# Patient Record
Sex: Male | Born: 1940
Health system: Southern US, Community
[De-identification: ages and names within clinical notes are randomized; demographics above are authoritative.]

## PROBLEM LIST (undated history)

## (undated) ENCOUNTER — Ambulatory Visit: Admission: EM | Source: Home / Self Care

## (undated) ENCOUNTER — Ambulatory Visit: Admission: EM | Payer: Medicare Other

## (undated) DIAGNOSIS — R05 Cough: Secondary | ICD-10-CM

## (undated) DIAGNOSIS — K219 Gastro-esophageal reflux disease without esophagitis: Secondary | ICD-10-CM

## (undated) DIAGNOSIS — R059 Cough, unspecified: Secondary | ICD-10-CM

## (undated) DIAGNOSIS — M67919 Unspecified disorder of synovium and tendon, unspecified shoulder: Secondary | ICD-10-CM

## (undated) DIAGNOSIS — R7989 Other specified abnormal findings of blood chemistry: Secondary | ICD-10-CM

## (undated) DIAGNOSIS — R7303 Prediabetes: Secondary | ICD-10-CM

## (undated) DIAGNOSIS — J382 Nodules of vocal cords: Secondary | ICD-10-CM

## (undated) DIAGNOSIS — I619 Nontraumatic intracerebral hemorrhage, unspecified: Secondary | ICD-10-CM

## (undated) DIAGNOSIS — E291 Testicular hypofunction: Secondary | ICD-10-CM

## (undated) DIAGNOSIS — I10 Essential (primary) hypertension: Secondary | ICD-10-CM

## (undated) DIAGNOSIS — G4700 Insomnia, unspecified: Secondary | ICD-10-CM

## (undated) DIAGNOSIS — G4730 Sleep apnea, unspecified: Secondary | ICD-10-CM

## (undated) DIAGNOSIS — I639 Cerebral infarction, unspecified: Secondary | ICD-10-CM

## (undated) DIAGNOSIS — R490 Dysphonia: Secondary | ICD-10-CM

## (undated) DIAGNOSIS — M810 Age-related osteoporosis without current pathological fracture: Secondary | ICD-10-CM

## (undated) DIAGNOSIS — S20219A Contusion of unspecified front wall of thorax, initial encounter: Secondary | ICD-10-CM

## (undated) DIAGNOSIS — E162 Hypoglycemia, unspecified: Secondary | ICD-10-CM

## (undated) DIAGNOSIS — K2970 Gastritis, unspecified, without bleeding: Secondary | ICD-10-CM

## (undated) DIAGNOSIS — T4145XA Adverse effect of unspecified anesthetic, initial encounter: Secondary | ICD-10-CM

## (undated) DIAGNOSIS — K225 Diverticulum of esophagus, acquired: Secondary | ICD-10-CM

## (undated) DIAGNOSIS — T8859XA Other complications of anesthesia, initial encounter: Secondary | ICD-10-CM

## (undated) DIAGNOSIS — E785 Hyperlipidemia, unspecified: Secondary | ICD-10-CM

## (undated) DIAGNOSIS — Z8619 Personal history of other infectious and parasitic diseases: Secondary | ICD-10-CM

## (undated) DIAGNOSIS — M1990 Unspecified osteoarthritis, unspecified site: Secondary | ICD-10-CM

## (undated) DIAGNOSIS — J383 Other diseases of vocal cords: Secondary | ICD-10-CM

## (undated) HISTORY — DX: Other specified abnormal findings of blood chemistry: R79.89

## (undated) HISTORY — DX: Prediabetes: R73.03

## (undated) HISTORY — DX: Contusion of unspecified front wall of thorax, initial encounter: S20.219A

## (undated) HISTORY — PX: FRACTURE SURGERY: SHX138

## (undated) HISTORY — PX: THROAT SURGERY: SHX803

## (undated) HISTORY — PX: TONSILLECTOMY: SUR1361

## (undated) HISTORY — DX: Insomnia, unspecified: G47.00

## (undated) HISTORY — DX: Gastro-esophageal reflux disease without esophagitis: K21.9

## (undated) HISTORY — DX: Essential (primary) hypertension: I10

## (undated) HISTORY — PX: EYE SURGERY: SHX253

## (undated) HISTORY — DX: Hyperlipidemia, unspecified: E78.5

## (undated) HISTORY — DX: Dysphonia: R49.0

## (undated) HISTORY — DX: Testicular hypofunction: E29.1

## (undated) HISTORY — DX: Unspecified disorder of synovium and tendon, unspecified shoulder: M67.919

## (undated) HISTORY — DX: Other diseases of vocal cords: J38.3

## (undated) HISTORY — PX: FEMUR FRACTURE SURGERY: SHX633

## (undated) HISTORY — PX: OTHER SURGICAL HISTORY: SHX169

## (undated) HISTORY — DX: Diverticulum of esophagus, acquired: K22.5

## (undated) HISTORY — DX: Hypoglycemia, unspecified: E16.2

## (undated) HISTORY — DX: Gastritis, unspecified, without bleeding: K29.70

## (undated) HISTORY — PX: PENILE BIOPSY: SHX6013

## (undated) HISTORY — DX: Personal history of other infectious and parasitic diseases: Z86.19

## (undated) HISTORY — DX: Age-related osteoporosis without current pathological fracture: M81.0

## (undated) HISTORY — DX: Cerebral infarction, unspecified: I63.9

## (undated) HISTORY — DX: Unspecified osteoarthritis, unspecified site: M19.90

## (undated) HISTORY — PX: APPENDECTOMY: SHX54

## (undated) HISTORY — PX: SHOULDER SURGERY: SHX246

## (undated) HISTORY — DX: Nodules of vocal cords: J38.2

---

## 2013-05-07 ENCOUNTER — Ambulatory Visit: Payer: Self-pay | Admitting: Orthopedic Surgery

## 2013-09-22 ENCOUNTER — Emergency Department: Payer: Self-pay | Admitting: Emergency Medicine

## 2013-09-22 LAB — CBC
HCT: 42.5 % (ref 40.0–52.0)
Platelet: 193 10*3/uL (ref 150–440)
RBC: 4.53 10*6/uL (ref 4.40–5.90)
RDW: 13.4 % (ref 11.5–14.5)
WBC: 7.6 10*3/uL (ref 3.8–10.6)

## 2013-09-22 LAB — TROPONIN I: Troponin-I: <0.02 ng/mL

## 2013-09-22 LAB — COMPREHENSIVE METABOLIC PANEL
Albumin: 3.7 g/dL (ref 3.4–5.0)
Alkaline Phosphatase: 58 U/L (ref 50–136)
BUN: 22 mg/dL — ABNORMAL HIGH (ref 7–18)
Chloride: 104 mmol/L (ref 98–107)
Co2: 28 mmol/L (ref 21–32)
Creatinine: 0.9 mg/dL (ref 0.60–1.30)
EGFR (Non-African Amer.): >60
Glucose: 107 mg/dL — ABNORMAL HIGH (ref 65–99)
Osmolality: 276 (ref 275–301)
Potassium: 3.8 mmol/L (ref 3.5–5.1)
SGPT (ALT): 26 U/L (ref 12–78)
Total Protein: 7.4 g/dL (ref 6.4–8.2)

## 2013-09-22 LAB — URINALYSIS, COMPLETE
Bacteria: NONE SEEN
Bilirubin,UR: NEGATIVE
Glucose,UR: NEGATIVE mg/dL (ref 0–75)
Hyaline Cast: 5
Ketone: NEGATIVE
Leukocyte Esterase: NEGATIVE
Nitrite: NEGATIVE
Ph: 5 (ref 4.5–8.0)
RBC,UR: 10 /HPF (ref 0–5)
Specific Gravity: 1.02 (ref 1.003–1.030)
Squamous Epithelial: <1
WBC UR: <1 /HPF (ref 0–5)

## 2013-09-22 LAB — LIPASE, BLOOD: Lipase: 175 U/L (ref 73–393)

## 2017-01-01 DIAGNOSIS — N481 Balanitis: Secondary | ICD-10-CM | POA: Diagnosis not present

## 2017-01-01 DIAGNOSIS — N401 Enlarged prostate with lower urinary tract symptoms: Secondary | ICD-10-CM | POA: Diagnosis not present

## 2017-01-01 DIAGNOSIS — R3912 Poor urinary stream: Secondary | ICD-10-CM | POA: Diagnosis not present

## 2017-01-01 DIAGNOSIS — N476 Balanoposthitis: Secondary | ICD-10-CM | POA: Diagnosis not present

## 2017-01-09 ENCOUNTER — Ambulatory Visit
Admission: AD | Admit: 2017-01-09 | Discharge: 2017-01-09 | Disposition: A | Discharge status: Home / Self Care | Payer: Medicare Other | Attending: Family Medicine | Admitting: Family Medicine

## 2017-01-09 DIAGNOSIS — Z79899 Other long term (current) drug therapy: Secondary | ICD-10-CM | POA: Insufficient documentation

## 2017-01-09 DIAGNOSIS — Z9889 Other specified postprocedural states: Secondary | ICD-10-CM | POA: Insufficient documentation

## 2017-01-09 DIAGNOSIS — I10 Essential (primary) hypertension: Secondary | ICD-10-CM | POA: Diagnosis not present

## 2017-01-09 DIAGNOSIS — I629 Nontraumatic intracranial hemorrhage, unspecified: Secondary | ICD-10-CM | POA: Insufficient documentation

## 2017-01-09 DIAGNOSIS — M940 Chondrocostal junction syndrome [Tietze]: Secondary | ICD-10-CM

## 2017-01-09 DIAGNOSIS — R079 Chest pain, unspecified: Secondary | ICD-10-CM | POA: Diagnosis not present

## 2017-01-09 HISTORY — DX: Nontraumatic intracerebral hemorrhage, unspecified: I61.9

## 2017-01-09 NOTE — ED Provider Notes (Signed)
[facility]    CSN: [id number] Arrival date & time: 01/09/17  1700     History   Chief Complaint Chief Complaint  Patient presents with  . Chest Pain    HPI Shawn Khan is a 76 y.o. male.   The history is provided by the patient.  Chest Pain  Pain location:  L chest Pain quality: sharp and stabbing   Pain radiates to:  Does not radiate Pain severity:  Mild Onset quality:  Gradual Duration:  3 days Timing:  Constant Progression:  Unchanged Chronicity:  New Context: lifting (luggage recently) and movement (worse with movement)   Worsened by:  Certain positions, coughing, movement and deep breathing Ineffective treatments:  None tried Associated symptoms: no abdominal pain, no AICD problem, no altered mental status, no anorexia, no anxiety, no back pain, no claudication, no cough, no diaphoresis, no dizziness, no dysphagia, no fatigue, no fever, no headache, no heartburn, no lower extremity edema, no nausea, no near-syncope, no numbness, no orthopnea, no palpitations, no PND, no shortness of breath, no syncope, no vomiting and no weakness   Associated symptoms comment:  Had recent cough, but now resolved; states about 1-2 weeks ago had a dry severe cough Risk factors: hypertension   Risk factors: no coronary artery disease     Past Medical History:  Diagnosis Date  . Hemorrhagic stroke ([place])     There are no active problems to display for this patient.   Past Surgical History:  Procedure Laterality Date  . APPENDECTOMY    . FEMUR FRACTURE SURGERY    . TONSILLECTOMY         Home Medications    Prior to Admission medications   Medication Sig Start Date End Date Taking? Authorizing Provider  lisinopril (PRINIVIL,ZESTRIL) 10 MG tablet Take 10 mg by mouth daily.   Yes Historical Provider, MD  tamsulosin (FLOMAX) 0.4 MG CAPS capsule Take 0.4 mg by mouth.   Yes Historical Provider, MD    Family History History reviewed. No pertinent family  history.  Social History Social History  Substance Use Topics  . Smoking status: Never Smoker  . Smokeless tobacco: Never Used  . Alcohol use No     Allergies   Patient has no known allergies.   Review of Systems Review of Systems  Constitutional: Negative for diaphoresis, fatigue and fever.  HENT: Negative for trouble swallowing.   Respiratory: Negative for cough and shortness of breath.   Cardiovascular: Positive for chest pain. Negative for palpitations, orthopnea, claudication, syncope, PND and near-syncope.  Gastrointestinal: Negative for abdominal pain, anorexia, heartburn, nausea and vomiting.  Musculoskeletal: Negative for back pain.  Neurological: Negative for dizziness, weakness, numbness and headaches.     Physical Exam Triage Vital Signs ED Triage Vitals  Enc Vitals Group     BP 01/09/17 1731 130/75     Pulse Rate 01/09/17 1731 62     Resp 01/09/17 1731 17     Temp --      Temp src --      SpO2 01/09/17 1731 97 %     Weight 01/09/17 1731 175 lb (79.4 kg)     Height 01/09/17 1731 5\' 9"  (1.753 m)     Head Circumference --      Peak Flow --      Pain Score 01/09/17 1733 6     Pain Loc --      Pain Edu? --      Excl. in [place]? --  No data found.   Updated Vital Signs BP 130/75 (BP Location: Left Arm)   Pulse 62   Resp 17   Ht 5\' 9"  (1.753 m)   Wt 175 lb (79.4 kg)   SpO2 97%   BMI 25.84 kg/m   Visual Acuity Right Eye Distance:   Left Eye Distance:   Bilateral Distance:    Right Eye Near:   Left Eye Near:    Bilateral Near:     Physical Exam  Constitutional: He is oriented to person, place, and time. He appears well-developed and well-nourished. No distress.  Cardiovascular: Regular rhythm, normal heart sounds and intact distal pulses.  Bradycardia present.   No murmur heard. Pulmonary/Chest: Effort normal and breath sounds normal. No respiratory distress. He has no wheezes. He has no rales. He exhibits tenderness (point tenderness at left  upper chest wall;  reproducible of symptom).  Abdominal: Soft.  Musculoskeletal: He exhibits no edema.  Neurological: He is alert and oriented to person, place, and time.  Skin: He is not diaphoretic.  Nursing note and vitals reviewed.    UC Treatments / Results  Labs (all labs ordered are listed, but only abnormal results are displayed) Labs Reviewed - No data to display  EKG  EKG Interpretation None       Radiology No results found.  Procedures ED EKG Date/Time: 01/09/2017 5:58 PM Performed by: Norval Gable Authorized by: Norval Gable   ECG reviewed by ED Physician in the absence of a cardiologist: yes   Previous ECG:    Previous ECG:  Unavailable Interpretation:    Interpretation: abnormal   Rate:    ECG rate assessment: bradycardic   Rhythm:    Rhythm: sinus bradycardia   Ectopy:    Ectopy: none   QRS:    QRS axis:  Normal Conduction:    Conduction: normal   ST segments:    ST segments:  Normal T waves:    T waves: normal      (including critical care time)  Medications Ordered in UC Medications - No data to display   Initial Impression / Assessment and Plan / UC Course  I have reviewed the triage vital signs and the nursing notes.  Pertinent labs & imaging results that were available during my care of the patient were reviewed by me and considered in my medical decision making (see chart for details).       Final Clinical Impressions(s) / UC Diagnoses   Final diagnoses:  Costochondritis, acute    New Prescriptions Discharge Medication List as of 01/09/2017  5:55 PM     1. ekg results and diagnosis reviewed with patient 2. Recommend supportive treatment with otc acetaminophen, rest, ice 3. Follow-up prn if symptoms worsen or don't improve   Norval Gable, MD 01/09/17 [phone]

## 2017-01-09 NOTE — ED Triage Notes (Signed)
Patient complains of left sided chest pain that he first thought was a bruised rib. Patient states that pain has moved over to middle left chest and he has noticed some pressure. Patient states that this originally start 3 days ago. Patient notices a catching sensation in his left chest with a deep breath.

## 2017-03-07 DIAGNOSIS — E782 Mixed hyperlipidemia: Secondary | ICD-10-CM | POA: Diagnosis not present

## 2017-03-08 DIAGNOSIS — Z6826 Body mass index (BMI) 26.0-26.9, adult: Secondary | ICD-10-CM | POA: Diagnosis not present

## 2017-03-08 DIAGNOSIS — R1319 Other dysphagia: Secondary | ICD-10-CM | POA: Diagnosis not present

## 2017-03-08 DIAGNOSIS — I10 Essential (primary) hypertension: Secondary | ICD-10-CM | POA: Diagnosis not present

## 2017-03-08 DIAGNOSIS — M25562 Pain in left knee: Secondary | ICD-10-CM | POA: Diagnosis not present

## 2017-03-14 DIAGNOSIS — S83232A Complex tear of medial meniscus, current injury, left knee, initial encounter: Secondary | ICD-10-CM | POA: Diagnosis not present

## 2017-03-14 DIAGNOSIS — M25562 Pain in left knee: Secondary | ICD-10-CM | POA: Diagnosis not present

## 2017-03-19 DIAGNOSIS — M1712 Unilateral primary osteoarthritis, left knee: Secondary | ICD-10-CM | POA: Diagnosis not present

## 2017-03-19 DIAGNOSIS — M25562 Pain in left knee: Secondary | ICD-10-CM | POA: Diagnosis not present

## 2017-03-19 DIAGNOSIS — S83232A Complex tear of medial meniscus, current injury, left knee, initial encounter: Secondary | ICD-10-CM | POA: Diagnosis not present

## 2017-04-11 DIAGNOSIS — K2270 Barrett's esophagus without dysplasia: Secondary | ICD-10-CM | POA: Diagnosis not present

## 2017-04-11 DIAGNOSIS — K222 Esophageal obstruction: Secondary | ICD-10-CM | POA: Diagnosis not present

## 2017-04-11 DIAGNOSIS — R1310 Dysphagia, unspecified: Secondary | ICD-10-CM | POA: Diagnosis not present

## 2017-04-11 DIAGNOSIS — Z1211 Encounter for screening for malignant neoplasm of colon: Secondary | ICD-10-CM | POA: Diagnosis not present

## 2017-04-11 LAB — HM COLONOSCOPY

## 2017-08-06 ENCOUNTER — Other Ambulatory Visit: Payer: Self-pay | Admitting: Unknown Physician Specialty

## 2017-08-06 DIAGNOSIS — R1310 Dysphagia, unspecified: Secondary | ICD-10-CM

## 2017-08-12 ENCOUNTER — Ambulatory Visit
Admission: RE | Admit: 2017-08-12 | Discharge: 2017-08-12 | Disposition: A | Discharge status: Home / Self Care | Payer: Medicare Other | Source: Ambulatory Visit | Attending: Unknown Physician Specialty | Admitting: Unknown Physician Specialty

## 2017-08-12 DIAGNOSIS — K225 Diverticulum of esophagus, acquired: Secondary | ICD-10-CM | POA: Insufficient documentation

## 2017-08-12 DIAGNOSIS — K228 Other specified diseases of esophagus: Secondary | ICD-10-CM | POA: Insufficient documentation

## 2017-08-12 DIAGNOSIS — R1310 Dysphagia, unspecified: Secondary | ICD-10-CM

## 2017-08-12 DIAGNOSIS — K449 Diaphragmatic hernia without obstruction or gangrene: Secondary | ICD-10-CM | POA: Diagnosis not present

## 2017-08-27 DIAGNOSIS — K225 Diverticulum of esophagus, acquired: Secondary | ICD-10-CM | POA: Diagnosis not present

## 2017-09-11 ENCOUNTER — Encounter
Admission: RE | Admit: 2017-09-11 | Discharge: 2017-09-11 | Disposition: A | Discharge status: Home / Self Care | Payer: Medicare Other | Source: Ambulatory Visit | Attending: Unknown Physician Specialty | Admitting: Unknown Physician Specialty

## 2017-09-11 DIAGNOSIS — Z01812 Encounter for preprocedural laboratory examination: Secondary | ICD-10-CM | POA: Insufficient documentation

## 2017-09-11 HISTORY — DX: Other complications of anesthesia, initial encounter: T88.59XA

## 2017-09-11 HISTORY — DX: Adverse effect of unspecified anesthetic, initial encounter: T41.45XA

## 2017-09-11 HISTORY — DX: Cough: R05

## 2017-09-11 HISTORY — DX: Cough, unspecified: R05.9

## 2017-09-11 HISTORY — DX: Sleep apnea, unspecified: G47.30

## 2017-09-11 LAB — CBC
HCT: 42.4 % (ref 40.0–52.0)
HEMOGLOBIN: 14.4 g/dL (ref 13.0–18.0)
MCH: 32.7 pg (ref 26.0–34.0)
MCHC: 34 g/dL (ref 32.0–36.0)
MCV: 96.2 fL (ref 80.0–100.0)
PLATELETS: 191 10*3/uL (ref 150–440)
RBC: 4.41 MIL/uL (ref 4.40–5.90)
RDW: 14.1 % (ref 11.5–14.5)
WBC: 6.7 10*3/uL (ref 3.8–10.6)

## 2017-09-11 NOTE — Patient Instructions (Signed)
  Your procedure is scheduled on: 09/17/17 Report to Day Surgery. [facility]OND  FLOOR To find out your arrival time please call [phone] between 1PM - 3PM on 09/16/17 Remember: Instructions that are not followed completely may result in serious medical risk, up to and including death, or upon the discretion of your surgeon and anesthesiologist your surgery may need to be rescheduled.    _X___ 1. Do not eat food after midnight the night before your procedure. No gum chewing or hard candies. You may drink clear liquids up to 2 hours before you are scheduled to arrive for your surgery- DO not drink clear liquids within 2 hours of the start of your surgery.  Clear Liquids include: water, apple juice without pulp, clear carbohydrate drink such as Clearfast of Gartorade, Black Coffee or Tea (Do not add anything to coffee or tea).    ____ 2. No Alcohol for 24 hours before or after surgery.   ____ 3. Do Not Smoke For 24 Hours Prior to Your Surgery.   ____ 4. Bring all medications with you on the day of surgery if instructed.    __X__ 5. Notify your doctor if there is any change in your medical condition     (cold, fever, infections).       Do not wear jewelry, make-up, hairpins, clips or nail polish.  Do not wear lotions, powders, or perfumes. You may wear deodorant.  Do not shave 48 hours prior to surgery. Men may shave face and neck.  Do not bring valuables to the hospital.    [facility] is not responsible for any belongings or valuables.               Contacts, dentures or bridgework may not be worn into surgery.  Leave your suitcase in the car. After surgery it may be brought to your room.  For patients admitted to the hospital, discharge time is determined by your                treatment team.   Patients discharged the day of surgery will not be allowed to drive home.    __X__ Take these medicines the morning of surgery with A SIP OF WATER:    1.  TAMSULOSIN  2.   3.   4.  5.  6.  ____ Fleet Enema (as directed)   ____ Use CHG Soap as directed  ____ Use inhalers on the day of surgery  ____ Stop metformin 2 days prior to surgery    ____ Take 1/2 of usual insulin dose the night before surgery and none on the morning of surgery.   ____ Stop Coumadin/Plavix/aspirin on   ____ Stop Anti-inflammatories on    _X___ Stop supplements until after surgery.  FORSKOLIN  ___X_ Bring C-Pap to the hospital.

## 2017-09-17 ENCOUNTER — Ambulatory Visit: Payer: Medicare Other | Admitting: Anesthesiology

## 2017-09-17 ENCOUNTER — Encounter: Payer: Self-pay | Admitting: Emergency Medicine

## 2017-09-17 ENCOUNTER — Encounter
Admission: RE | Disposition: A | Discharge status: Home / Self Care | Payer: Self-pay | Source: Ambulatory Visit | Attending: Unknown Physician Specialty

## 2017-09-17 ENCOUNTER — Ambulatory Visit
Admission: RE | Admit: 2017-09-17 | Discharge: 2017-09-17 | Disposition: A | Discharge status: Home / Self Care | Payer: Medicare Other | Source: Ambulatory Visit | Attending: Unknown Physician Specialty | Admitting: Unknown Physician Specialty

## 2017-09-17 DIAGNOSIS — Z79899 Other long term (current) drug therapy: Secondary | ICD-10-CM | POA: Insufficient documentation

## 2017-09-17 DIAGNOSIS — I10 Essential (primary) hypertension: Secondary | ICD-10-CM | POA: Diagnosis not present

## 2017-09-17 DIAGNOSIS — K225 Diverticulum of esophagus, acquired: Secondary | ICD-10-CM | POA: Insufficient documentation

## 2017-09-17 DIAGNOSIS — R1310 Dysphagia, unspecified: Secondary | ICD-10-CM | POA: Diagnosis not present

## 2017-09-17 HISTORY — PX: LARYNGOSCOPY: SHX5203

## 2017-09-17 SURGERY — LARYNGOSCOPY
Anesthesia: General

## 2017-09-17 MED ORDER — PROPOFOL 10 MG/ML IV BOLUS
INTRAVENOUS | Status: AC
  Filled 2017-09-17: qty 40

## 2017-09-17 MED ORDER — SUCCINYLCHOLINE CHLORIDE 20 MG/ML IJ SOLN
INTRAMUSCULAR | Status: AC
  Filled 2017-09-17: qty 1

## 2017-09-17 MED ORDER — MEPERIDINE HCL 50 MG/ML IJ SOLN: 6.2500 mg | INTRAMUSCULAR | Status: DC | PRN

## 2017-09-17 MED ORDER — OXYCODONE HCL 5 MG PO TABS: 5.0000 mg | ORAL_TABLET | Freq: Once | ORAL | Status: DC | PRN

## 2017-09-17 MED ORDER — DEXAMETHASONE SODIUM PHOSPHATE 10 MG/ML IJ SOLN
INTRAMUSCULAR | Status: DC | PRN
  Administered 2017-09-17: 10 mg via INTRAVENOUS

## 2017-09-17 MED ORDER — LIDOCAINE HCL (CARDIAC) 20 MG/ML IV SOLN
INTRAVENOUS | Status: DC | PRN
  Administered 2017-09-17: 80 mg via INTRAVENOUS

## 2017-09-17 MED ORDER — LIDOCAINE HCL (PF) 4 % IJ SOLN
INTRAMUSCULAR | Status: AC
  Filled 2017-09-17: qty 10

## 2017-09-17 MED ORDER — PHENYLEPHRINE HCL 10 MG/ML IJ SOLN
INTRAMUSCULAR | Status: DC | PRN
  Administered 2017-09-17: 200 ug via INTRAVENOUS

## 2017-09-17 MED ORDER — SUCCINYLCHOLINE CHLORIDE 20 MG/ML IJ SOLN
INTRAMUSCULAR | Status: DC | PRN
  Administered 2017-09-17: 100 mg via INTRAVENOUS

## 2017-09-17 MED ORDER — OXYCODONE HCL 5 MG/5ML PO SOLN: 5.0000 mg | Freq: Once | ORAL | Status: DC | PRN

## 2017-09-17 MED ORDER — ROCURONIUM BROMIDE 100 MG/10ML IV SOLN
INTRAVENOUS | Status: DC | PRN
  Administered 2017-09-17: 10 mg via INTRAVENOUS
  Administered 2017-09-17: 40 mg via INTRAVENOUS

## 2017-09-17 MED ORDER — SUGAMMADEX SODIUM 200 MG/2ML IV SOLN
INTRAVENOUS | Status: AC
  Filled 2017-09-17: qty 2

## 2017-09-17 MED ORDER — SUGAMMADEX SODIUM 200 MG/2ML IV SOLN
INTRAVENOUS | Status: DC | PRN
  Administered 2017-09-17: 200 mg via INTRAVENOUS

## 2017-09-17 MED ORDER — ONDANSETRON HCL 4 MG/2ML IJ SOLN
INTRAMUSCULAR | Status: DC | PRN
  Administered 2017-09-17: 4 mg via INTRAVENOUS

## 2017-09-17 MED ORDER — LACTATED RINGERS IV SOLN
INTRAVENOUS | Status: DC
  Administered 2017-09-17 (×2): via INTRAVENOUS

## 2017-09-17 MED ORDER — FAMOTIDINE 20 MG PO TABS
20.0000 mg | ORAL_TABLET | Freq: Once | ORAL | Status: AC
  Administered 2017-09-17: 20 mg via ORAL

## 2017-09-17 MED ORDER — PHENYLEPHRINE HCL 10 MG/ML IJ SOLN
INTRAMUSCULAR | Status: AC
Start: 2017-09-17 — End: ?
  Filled 2017-09-17: qty 1

## 2017-09-17 MED ORDER — EPHEDRINE SULFATE 50 MG/ML IJ SOLN
INTRAMUSCULAR | Status: DC | PRN
  Administered 2017-09-17: 10 mg via INTRAVENOUS

## 2017-09-17 MED ORDER — LIDOCAINE HCL (PF) 2 % IJ SOLN
INTRAMUSCULAR | Status: AC
  Filled 2017-09-17: qty 4

## 2017-09-17 MED ORDER — BACITRACIN ZINC 500 UNIT/GM EX OINT
TOPICAL_OINTMENT | CUTANEOUS | Status: AC
  Filled 2017-09-17: qty 28.35

## 2017-09-17 MED ORDER — FENTANYL CITRATE (PF) 100 MCG/2ML IJ SOLN
INTRAMUSCULAR | Status: DC | PRN
  Administered 2017-09-17 (×2): 50 ug via INTRAVENOUS

## 2017-09-17 MED ORDER — PROPOFOL 10 MG/ML IV BOLUS
INTRAVENOUS | Status: DC | PRN
  Administered 2017-09-17: 120 mg via INTRAVENOUS
  Administered 2017-09-17: 20 mg via INTRAVENOUS
  Administered 2017-09-17: 60 mg via INTRAVENOUS

## 2017-09-17 MED ORDER — MIDAZOLAM HCL 2 MG/2ML IJ SOLN
INTRAMUSCULAR | Status: DC | PRN
  Administered 2017-09-17 (×2): 1 mg via INTRAVENOUS

## 2017-09-17 MED ORDER — FENTANYL CITRATE (PF) 100 MCG/2ML IJ SOLN
INTRAMUSCULAR | Status: AC
  Filled 2017-09-17: qty 2

## 2017-09-17 MED ORDER — LIDOCAINE-EPINEPHRINE 1 %-1:100000 IJ SOLN
INTRAMUSCULAR | Status: AC
Start: 2017-09-17 — End: ?
  Filled 2017-09-17: qty 1

## 2017-09-17 MED ORDER — PROMETHAZINE HCL 25 MG/ML IJ SOLN: 6.2500 mg | INTRAMUSCULAR | Status: DC | PRN

## 2017-09-17 MED ORDER — FAMOTIDINE 20 MG PO TABS
ORAL_TABLET | ORAL | Status: AC
  Administered 2017-09-17: 20 mg via ORAL
  Filled 2017-09-17: qty 1

## 2017-09-17 MED ORDER — MIDAZOLAM HCL 2 MG/2ML IJ SOLN
INTRAMUSCULAR | Status: AC
  Filled 2017-09-17: qty 2

## 2017-09-17 MED ORDER — GLYCOPYRROLATE 0.2 MG/ML IJ SOLN
INTRAMUSCULAR | Status: DC | PRN
  Administered 2017-09-17: 0.2 mg via INTRAVENOUS

## 2017-09-17 MED ORDER — EPHEDRINE SULFATE 50 MG/ML IJ SOLN
INTRAMUSCULAR | Status: AC
  Filled 2017-09-17: qty 1

## 2017-09-17 MED ORDER — GLYCOPYRROLATE 0.2 MG/ML IJ SOLN
INTRAMUSCULAR | Status: AC
  Filled 2017-09-17: qty 1

## 2017-09-17 MED ORDER — PHENYLEPHRINE HCL 10 MG/ML IJ SOLN
INTRAMUSCULAR | Status: AC
  Filled 2017-09-17: qty 1

## 2017-09-17 MED ORDER — FENTANYL CITRATE (PF) 100 MCG/2ML IJ SOLN: 25.0000 ug | INTRAMUSCULAR | Status: DC | PRN

## 2017-09-17 SURGICAL SUPPLY — 32 items
BASIN GRAD PLASTIC 32OZ STRL (MISCELLANEOUS) IMPLANT
BLADE SURG 15 STRL LF DISP TIS (BLADE) ×2 IMPLANT
BLADE SURG 15 STRL SS (BLADE) ×1
CANISTER SUCT 1200ML W/VALVE (MISCELLANEOUS) ×3 IMPLANT
CATH FOL 2WAY LX 18X5 (CATHETERS) IMPLANT
CORD BIP STRL DISP 12FT (MISCELLANEOUS) IMPLANT
DRAPE MAG INST 16X20 L/F (DRAPES) IMPLANT
DRSG TEGADERM 2-3/8X2-3/4 SM (GAUZE/BANDAGES/DRESSINGS) IMPLANT
ELECT CAUTERY BLADE TIP 2.5 (TIP)
ELECT REM PT RETURN 9FT ADLT (ELECTROSURGICAL) ×3
ELECTRODE CAUTERY BLDE TIP 2.5 (TIP) IMPLANT
ELECTRODE REM PT RTRN 9FT ADLT (ELECTROSURGICAL) ×2 IMPLANT
FORCEPS JEWEL BIP 4-3/4 STR (INSTRUMENTS) IMPLANT
GLOVE BIO SURGEON STRL SZ7.5 (GLOVE) ×6 IMPLANT
GOWN STRL REUS W/ TWL LRG LVL3 (GOWN DISPOSABLE) ×6 IMPLANT
GOWN STRL REUS W/TWL LRG LVL3 (GOWN DISPOSABLE) ×3
HOOK STAY BLUNT/RETRACTOR 5M (MISCELLANEOUS) IMPLANT
JACKSON PRATT 10 (INSTRUMENTS) IMPLANT
JACKSON PRATT 7MM (INSTRUMENTS) IMPLANT
LABEL OR SOLS (LABEL) ×3 IMPLANT
MARKER SKIN DUAL TIP RULER LAB (MISCELLANEOUS) ×3 IMPLANT
NS IRRIG 500ML POUR BTL (IV SOLUTION) ×3 IMPLANT
PACK HEAD/NECK (MISCELLANEOUS) ×3 IMPLANT
PROBE MONO 100X0.75 ELECT 1.9M (MISCELLANEOUS) IMPLANT
SPONGE KITTNER 5P (MISCELLANEOUS) IMPLANT
SPONGE LAP 18X18 5 PK (GAUZE/BANDAGES/DRESSINGS) IMPLANT
SPONGE XRAY 4X4 16PLY STRL (MISCELLANEOUS) IMPLANT
STAPLER SKIN PROX 35W (STAPLE) IMPLANT
SUCTION FRAZIER HANDLE 10FR (MISCELLANEOUS) ×1
SUCTION TUBE FRAZIER 10FR DISP (MISCELLANEOUS) ×2 IMPLANT
SURGILUBE 2OZ TUBE FLIPTOP (MISCELLANEOUS) ×3 IMPLANT
WATER STERILE IRR 1000ML POUR (IV SOLUTION) ×3 IMPLANT

## 2017-09-17 NOTE — Anesthesia Post-op Follow-up Note (Signed)
Anesthesia QCDR form completed.        

## 2017-09-17 NOTE — Op Note (Signed)
09/17/2017  7:58 AM    Shari Prows  [id number]   Pre-Op Dx: [place] DIVERTICULUM DYSPHAGIA  Post-op Dx: SAME  Proc: Direct laryngoscopy and cervical esophagoscopy   Surg:  [place] Corn:  GOT  EBL:  Less than 10 cc  Comp:  Mild tongue swelling  Findings:  Due to his narrow anatomy I was unable to advance the diverticula scope into the diverticulum and therefore the operation was aborted  Procedure: Shawn Khan was identified in the holding area taken the operating room placed in supine position. After general endotracheal anesthesia the table was turned 90. A tooth guard was placed. The diverticula scope was introduced into the oral cavity attempts were made to enter the diverticulum. Because of his narrow oropharyngeal airway this was very difficult. I was able to probe the esophagus with a nasogastric tube. I was not able to get the scope far enough in to the diverticulum to identify the wall of the cricopharyngeus. Attempted moving the endotracheal tube from side to side and reintroducing the diverticula scope but after multiple attempts was unable to enter the diverticulum due to his anatomy. Also the scope was in the smallest diameter and could not be opened because of his anatomy. This would have prevented putting the stapler through the scope to the distal end.  With inability to enter the diverticulum the operation was aborted. The scope and the tooth guard were removed there was mild swelling of the anterior tongue from compression of the scope. We had discussed an open procedure with the patient however he did not want to proceed with an open procedure and was not consented for such. Therefore he was returned to anesthesia where he was awakened in the operating room taken recovery room stable condition.  Dispo:   Good  Plan:  Discharge to home on a clear liquid diet for 24 hours in advance with soft diet. Follow up in the office 1 week  Keon Pender  T  09/17/2017 7:58 AM

## 2017-09-17 NOTE — Anesthesia Postprocedure Evaluation (Signed)
Anesthesia Post Note  Patient: Shawn Khan  Procedure(s) Performed: LARYNGOSCOPY  Patient location during evaluation: PACU Anesthesia Type: General Level of consciousness: awake and alert and oriented Pain management: pain level controlled Vital Signs Assessment: post-procedure vital signs reviewed and stable Respiratory status: spontaneous breathing, nonlabored ventilation and respiratory function stable Cardiovascular status: blood pressure returned to baseline and stable Postop Assessment: no signs of nausea or vomiting Anesthetic complications: no     Last Vitals:  Vitals:   09/17/17 0845 09/17/17 0900  BP: 125/80 133/86  Pulse: 81 78  Resp: (!) 9 17  Temp:  36.5 C  SpO2: 100% 99%    Last Pain:  Vitals:   09/17/17 0900  TempSrc:   PainSc: 0-No pain                 Gracyn Allor

## 2017-09-17 NOTE — Anesthesia Procedure Notes (Signed)
Procedure Name: Intubation Date/Time: 09/17/2017 7:19 AM Performed by: Silvana Newness Pre-anesthesia Checklist: Patient identified, Emergency Drugs available, Suction available, Patient being monitored and Timeout performed Patient Re-evaluated:Patient Re-evaluated prior to induction Oxygen Delivery Method: Circle system utilized Preoxygenation: Pre-oxygenation with 100% oxygen Induction Type: IV induction Ventilation: Mask ventilation without difficulty Laryngoscope Size: Mac and 4 Grade View: Grade I Tube type: Oral Tube size: 7.5 mm Number of attempts: 1 Airway Equipment and Method: Stylet Placement Confirmation: ETT inserted through vocal cords under direct vision,  positive ETCO2 and breath sounds checked- equal and bilateral Secured at: 21 cm Tube secured with: Tape Dental Injury: Teeth and Oropharynx as per pre-operative assessment

## 2017-09-17 NOTE — Anesthesia Preprocedure Evaluation (Signed)
Anesthesia Evaluation  Patient identified by MRN, date of birth, ID band Patient awake    Reviewed: Allergy & Precautions, NPO status , Patient's Chart, lab work & pertinent test results  History of Anesthesia Complications (+) history of anesthetic complications (hx of hoarseness after intubation)  Airway Mallampati: III  TM Distance: >3 FB Neck ROM: Full    Dental no notable dental hx.    Pulmonary sleep apnea , neg COPD,    breath sounds clear to auscultation- rhonchi (-) wheezing      Cardiovascular Exercise Tolerance: Good hypertension, Pt. on medications (-) CAD, (-) Past MI and (-) Cardiac Stents  Rhythm:Regular Rate:Normal - Systolic murmurs and - Diastolic murmurs    Neuro/Psych CVA, No Residual Symptoms negative neurological ROS  negative psych ROS   GI/Hepatic negative GI ROS, Neg liver ROS,   Endo/Other  negative endocrine ROSneg diabetes  Renal/GU negative Renal ROS     Musculoskeletal negative musculoskeletal ROS (+)   Abdominal (+) - obese,   Peds  Hematology negative hematology ROS (+)   Anesthesia Other Findings Past Medical History: No date: Complication of anesthesia     Comment:  h/o nasal intubation x 1 hoarseness x 1 week No date: Cough     Comment:  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 No date: Hemorrhagic stroke ([facility])     Comment:  more than 5 years ago No date: Sleep apnea     Comment:  cpap   Reproductive/Obstetrics                             Anesthesia Physical Anesthesia Plan  ASA: II  Anesthesia Plan: General   Post-op Pain Management:    Induction: Intravenous  PONV Risk Score and Plan: 1 and Ondansetron and Dexamethasone  Airway Management Planned: Oral ETT  Additional Equipment:   Intra-op Plan:   Post-operative Plan: Extubation in OR  Informed Consent: I have reviewed the patients History and Physical, chart, labs and discussed the procedure  including the risks, benefits and alternatives for the proposed anesthesia with the patient or authorized representative who has indicated his/her understanding and acceptance.   Dental advisory given  Plan Discussed with: CRNA and Anesthesiologist  Anesthesia Plan Comments:         Anesthesia Quick Evaluation

## 2017-09-17 NOTE — Transfer of Care (Signed)
Immediate Anesthesia Transfer of Care Note  Patient: Shawn Khan  Procedure(s) Performed: LARYNGOSCOPY  Patient Location: PACU  Anesthesia Type:General  Level of Consciousness: drowsy  Airway & Oxygen Therapy: Patient Spontanous Breathing and Patient connected to face mask oxygen  Post-op Assessment: Report given to RN, Post -op Vital signs reviewed and stable and Patient moving all extremities X 4  Post vital signs: Reviewed and stable  Last Vitals:  Vitals:   09/17/17 0605  BP: 124/73  Pulse: 64  Resp: 14  Temp: (!) 36.4 C  SpO2: 99%    Last Pain:  Vitals:   09/17/17 0605  TempSrc: Tympanic         Complications: No apparent anesthesia complications

## 2017-09-17 NOTE — Discharge Instructions (Signed)

## 2017-10-16 DIAGNOSIS — K225 Diverticulum of esophagus, acquired: Secondary | ICD-10-CM | POA: Diagnosis not present

## 2017-11-20 DIAGNOSIS — R7301 Impaired fasting glucose: Secondary | ICD-10-CM | POA: Insufficient documentation

## 2017-11-20 DIAGNOSIS — K225 Diverticulum of esophagus, acquired: Secondary | ICD-10-CM | POA: Diagnosis not present

## 2017-11-20 DIAGNOSIS — I10 Essential (primary) hypertension: Secondary | ICD-10-CM | POA: Diagnosis not present

## 2017-11-20 DIAGNOSIS — N471 Phimosis: Secondary | ICD-10-CM | POA: Insufficient documentation

## 2017-11-20 DIAGNOSIS — R350 Frequency of micturition: Secondary | ICD-10-CM | POA: Insufficient documentation

## 2017-11-20 DIAGNOSIS — Z01818 Encounter for other preprocedural examination: Secondary | ICD-10-CM | POA: Diagnosis not present

## 2017-11-20 DIAGNOSIS — R3915 Urgency of urination: Secondary | ICD-10-CM | POA: Insufficient documentation

## 2017-11-20 DIAGNOSIS — N401 Enlarged prostate with lower urinary tract symptoms: Secondary | ICD-10-CM | POA: Insufficient documentation

## 2017-11-20 DIAGNOSIS — G4700 Insomnia, unspecified: Secondary | ICD-10-CM | POA: Insufficient documentation

## 2017-11-20 DIAGNOSIS — N138 Other obstructive and reflux uropathy: Secondary | ICD-10-CM | POA: Insufficient documentation

## 2017-11-20 DIAGNOSIS — Z125 Encounter for screening for malignant neoplasm of prostate: Secondary | ICD-10-CM | POA: Diagnosis not present

## 2017-11-20 DIAGNOSIS — M67919 Unspecified disorder of synovium and tendon, unspecified shoulder: Secondary | ICD-10-CM | POA: Insufficient documentation

## 2017-11-20 DIAGNOSIS — M159 Polyosteoarthritis, unspecified: Secondary | ICD-10-CM | POA: Insufficient documentation

## 2017-11-20 DIAGNOSIS — R1310 Dysphagia, unspecified: Secondary | ICD-10-CM | POA: Insufficient documentation

## 2017-11-20 DIAGNOSIS — J029 Acute pharyngitis, unspecified: Secondary | ICD-10-CM | POA: Insufficient documentation

## 2017-11-20 DIAGNOSIS — E1369 Other specified diabetes mellitus with other specified complication: Secondary | ICD-10-CM | POA: Diagnosis not present

## 2017-11-20 DIAGNOSIS — R0989 Other specified symptoms and signs involving the circulatory and respiratory systems: Secondary | ICD-10-CM | POA: Diagnosis not present

## 2017-11-20 DIAGNOSIS — S20219A Contusion of unspecified front wall of thorax, initial encounter: Secondary | ICD-10-CM | POA: Insufficient documentation

## 2017-11-20 DIAGNOSIS — R0902 Hypoxemia: Secondary | ICD-10-CM | POA: Insufficient documentation

## 2017-11-20 DIAGNOSIS — R1319 Other dysphagia: Secondary | ICD-10-CM | POA: Insufficient documentation

## 2017-11-20 DIAGNOSIS — M25519 Pain in unspecified shoulder: Secondary | ICD-10-CM | POA: Insufficient documentation

## 2017-11-20 DIAGNOSIS — M549 Dorsalgia, unspecified: Secondary | ICD-10-CM | POA: Insufficient documentation

## 2017-11-20 DIAGNOSIS — K219 Gastro-esophageal reflux disease without esophagitis: Secondary | ICD-10-CM | POA: Insufficient documentation

## 2017-11-20 DIAGNOSIS — M949 Disorder of cartilage, unspecified: Secondary | ICD-10-CM

## 2017-11-20 DIAGNOSIS — L821 Other seborrheic keratosis: Secondary | ICD-10-CM | POA: Insufficient documentation

## 2017-11-20 DIAGNOSIS — R490 Dysphonia: Secondary | ICD-10-CM | POA: Insufficient documentation

## 2017-11-20 DIAGNOSIS — R0781 Pleurodynia: Secondary | ICD-10-CM | POA: Insufficient documentation

## 2017-11-20 DIAGNOSIS — I619 Nontraumatic intracerebral hemorrhage, unspecified: Secondary | ICD-10-CM | POA: Diagnosis not present

## 2017-11-20 DIAGNOSIS — Z8673 Personal history of transient ischemic attack (TIA), and cerebral infarction without residual deficits: Secondary | ICD-10-CM | POA: Insufficient documentation

## 2017-11-20 DIAGNOSIS — E291 Testicular hypofunction: Secondary | ICD-10-CM | POA: Insufficient documentation

## 2017-11-20 DIAGNOSIS — G4733 Obstructive sleep apnea (adult) (pediatric): Secondary | ICD-10-CM | POA: Insufficient documentation

## 2017-11-20 DIAGNOSIS — M810 Age-related osteoporosis without current pathological fracture: Secondary | ICD-10-CM | POA: Insufficient documentation

## 2017-11-20 DIAGNOSIS — R3129 Other microscopic hematuria: Secondary | ICD-10-CM | POA: Insufficient documentation

## 2017-11-20 DIAGNOSIS — M899 Disorder of bone, unspecified: Secondary | ICD-10-CM | POA: Insufficient documentation

## 2017-11-20 DIAGNOSIS — E782 Mixed hyperlipidemia: Secondary | ICD-10-CM | POA: Insufficient documentation

## 2017-11-21 ENCOUNTER — Ambulatory Visit
Admission: EM | Admit: 2017-11-21 | Discharge: 2017-11-21 | Disposition: A | Discharge status: Home / Self Care | Payer: Medicare Other | Attending: Family Medicine | Admitting: Family Medicine

## 2017-11-21 ENCOUNTER — Encounter: Payer: Self-pay | Admitting: *Deleted

## 2017-11-21 DIAGNOSIS — R109 Unspecified abdominal pain: Secondary | ICD-10-CM | POA: Diagnosis not present

## 2017-11-21 DIAGNOSIS — Z8673 Personal history of transient ischemic attack (TIA), and cerebral infarction without residual deficits: Secondary | ICD-10-CM | POA: Insufficient documentation

## 2017-11-21 DIAGNOSIS — N481 Balanitis: Secondary | ICD-10-CM

## 2017-11-21 DIAGNOSIS — R3129 Other microscopic hematuria: Secondary | ICD-10-CM | POA: Diagnosis not present

## 2017-11-21 DIAGNOSIS — Z79899 Other long term (current) drug therapy: Secondary | ICD-10-CM | POA: Diagnosis not present

## 2017-11-21 DIAGNOSIS — M545 Low back pain, unspecified: Secondary | ICD-10-CM

## 2017-11-21 DIAGNOSIS — N401 Enlarged prostate with lower urinary tract symptoms: Secondary | ICD-10-CM | POA: Insufficient documentation

## 2017-11-21 DIAGNOSIS — G4730 Sleep apnea, unspecified: Secondary | ICD-10-CM | POA: Insufficient documentation

## 2017-11-21 DIAGNOSIS — R350 Frequency of micturition: Secondary | ICD-10-CM | POA: Diagnosis present

## 2017-11-21 LAB — URINALYSIS, COMPLETE (UACMP) WITH MICROSCOPIC
BILIRUBIN URINE: NEGATIVE
Glucose, UA: NEGATIVE mg/dL
Ketones, ur: NEGATIVE mg/dL
LEUKOCYTES UA: NEGATIVE
Nitrite: NEGATIVE
PH: 5.5 (ref 5.0–8.0)
Protein, ur: NEGATIVE mg/dL
SPECIFIC GRAVITY, URINE: 1.01 (ref 1.005–1.030)

## 2017-11-21 MED ORDER — FLUCONAZOLE 150 MG PO TABS: 150.0000 mg | ORAL_TABLET | Freq: Once | ORAL | 0 refills | Status: AC

## 2017-11-21 NOTE — Discharge Instructions (Signed)
Take the medication as needed.  Tylenol as needed for pain.  See urology in the near future.  Take care  Dr. Lacinda Axon

## 2017-11-21 NOTE — ED Provider Notes (Signed)
[facility]  CSN: [id number] Arrival date & time: 11/21/17  1116  History   Chief Complaint Chief Complaint  Patient presents with  . Back Pain  . Flank Pain  . Urinary Frequency   HPI  76 year old male presents with back pain.  Patient reports that he has had back pain for "a few weeks".  Asked further he states that his been approximately 1 month.  He reports bilateral low back pain.  No midline pain.  Left greater than right.  He has been physically inactive over the past several weeks and recently increase his physical activity over the past 2 weeks.  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  Seems to be worse with range of motion/certain movements.  No relieving factors.  No medications or interventions tried.  Patient states that he cannot take aspirin or NSAIDs due to prior history of hemorrhagic stroke.  He reports urinary frequency but denies dysuria.  He reports nocturia.  He is currently on Flomax for BPH.  Additionally, patient states that he has noticed an odor of his genitalia.  He states that he is uncircumcised.  No rash.  Past Medical History:  Diagnosis Date  . Complication of anesthesia    h/o nasal intubation x 1 hoarseness x 1 week  . 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 Hemorrhagic stroke ([place])    more than 5 years ago  . Sleep apnea    cpap   Past Surgical History:  Procedure Laterality Date  . APPENDECTOMY    . FEMUR FRACTURE SURGERY    . FRACTURE SURGERY    . LARYNGOSCOPY  09/17/2017   Procedure: LARYNGOSCOPY;  Surgeon: Beverly Gust, MD;  Location: [facility] ORS;  Service: ENT;;  . rcr Bilateral   . SHOULDER SURGERY     x3  . THROAT SURGERY     vocal cord nodule  . TONSILLECTOMY     Home Medications    Prior to Admission medications   Medication Sig Start Date End Date Taking? Authorizing Provider  lisinopril (PRINIVIL,ZESTRIL) 20 MG tablet Take 20 mg by mouth daily.  07/02/17  Yes [provider]  fluconazole (DIFLUCAN) 150 MG tablet Take 1 tablet (150 mg total) by mouth once for 1 dose. 11/21/17 11/21/17  Coral Spikes, DO  Multiple Vitamins-Minerals (HM MULTIVITAMIN ADULT GUMMY) CHEW Chew 1 tablet by mouth daily.     [provider]  OVER THE COUNTER MEDICATION Take 1 capsule by mouth daily. Forskolin  Weight Loss supplement    [provider]  tamsulosin (FLOMAX) 0.4 MG CAPS capsule Take 0.4 mg by mouth 2 (two) times daily.     [provider]   Family History Arthritis Father    Cancer Mother    Diabetes Mother     Social History Social History   Tobacco Use  . Smoking status: Never Smoker  . Smokeless tobacco: Never Used  Substance Use Topics  . Alcohol use: No  . Drug use: No   Allergies   Patient has no known allergies.   Review of Systems Review of Systems  Gastrointestinal: Negative.   Genitourinary: Positive for frequency. Negative for dysuria and flank pain.       Genital odor noted.  Musculoskeletal: Positive for back pain.   Physical Exam Triage Vital Signs ED Triage Vitals  Enc Vitals Group     BP  Pulse      Resp      Temp      Temp src      SpO2      Weight      Height      Head Circumference      Peak Flow      Pain Score      Pain Loc      Pain Edu?      Excl. in [place]?    Updated Vital Signs BP 120/85 (BP Location: Left Arm)   Pulse (!) 52   Temp 98 F (36.7 C) (Oral)   Resp 16   Ht 5\' 10"  (1.778 m)   Wt 183 lb (83 kg)   SpO2 99%   BMI 26.26 kg/m    Physical Exam  Constitutional: He is oriented to person, place, and time. He appears well-developed and well-nourished. No distress.  Cardiovascular:  Bradycardic.  Regular rhythm.  No appreciable murmur.  Pulmonary/Chest: Effort normal and breath sounds normal. He has no wheezes. He has no rales.  Abdominal: Soft. He exhibits no distension. There is no tenderness.  Genitourinary: Testes normal. Right testis  shows no mass and no tenderness. Left testis shows no mass and no tenderness.  Genitourinary Comments: Uncircumcised.  Erythema noted of the head of the penis particular around the urethral meatus. No rash noted.   Musculoskeletal:  Mild paraspinal muscular tenderness of the lumbar spine bilaterally.  Straightening of the lordotic curve noted.  Neurological: He is alert and oriented to person, place, and time.  Psychiatric: He has a normal mood and affect. His behavior is normal.  Vitals reviewed.  UC Treatments / Results  Labs (all labs ordered are listed, but only abnormal results are displayed) Labs Reviewed  URINALYSIS, COMPLETE (UACMP) WITH MICROSCOPIC - Abnormal; Notable for the following components:      Result Value   Hgb urine dipstick MODERATE (*)    Squamous Epithelial / LPF 0-5 (*)    Bacteria, UA RARE (*)    All other components within normal limits    EKG  EKG Interpretation None      Radiology No results found.  Procedures Procedures (including critical care time)  Medications Ordered in UC Medications - No data to display  Initial Impression / Assessment and Plan / UC Course  I have reviewed the triage vital signs and the nursing notes.  Pertinent labs & imaging results that were available during my care of the patient were reviewed by me and considered in my medical decision making (see chart for details).    76 year old male presents with low back pain. Clinically his back pain appears to be musculoskeletal in origin.  His urine was notable for hematuria.  6-30 RBCs per high-power field.  I am optimistic that this is from balanitis that was noted on exam.  I am treating him with Diflucan.  Advised Tylenol for his pain as needed.  I encouraged him to follow-up with urologist.  I recommended [facility].  If persistent hematuria and continued back pain, will need further workup (likely imaging).  Final Clinical Impressions(s) / UC Diagnoses    Final diagnoses:  Bilateral low back pain without sciatica, unspecified chronicity  Microscopic hematuria  Balanitis   ED Discharge Orders        Ordered    fluconazole (DIFLUCAN) 150 MG tablet   Once     11/21/17 1213     Controlled Substance Prescriptions [place] Controlled Substance Registry consulted? Not  Applicable   Coral Spikes, DO 11/21/17 1220

## 2017-11-21 NOTE — ED Triage Notes (Signed)
Low back & flank pain with urianry frequency x2 weeks. Denies dysuria.

## 2017-11-22 LAB — URINE CULTURE: Culture: NO GROWTH

## 2017-11-26 ENCOUNTER — Ambulatory Visit (INDEPENDENT_AMBULATORY_CARE_PROVIDER_SITE_OTHER): Payer: Medicare Other | Admitting: Urology

## 2017-11-26 ENCOUNTER — Ambulatory Visit: Payer: Self-pay | Admitting: Urology

## 2017-11-26 ENCOUNTER — Encounter: Payer: Self-pay | Admitting: Urology

## 2017-11-26 VITALS — BP 125/77 | HR 69 | Ht 70.0 in | Wt 180.5 lb

## 2017-11-26 DIAGNOSIS — R350 Frequency of micturition: Secondary | ICD-10-CM

## 2017-11-26 DIAGNOSIS — N481 Balanitis: Secondary | ICD-10-CM | POA: Diagnosis not present

## 2017-11-26 DIAGNOSIS — R310 Gross hematuria: Secondary | ICD-10-CM | POA: Diagnosis not present

## 2017-11-26 DIAGNOSIS — R3129 Other microscopic hematuria: Secondary | ICD-10-CM | POA: Diagnosis not present

## 2017-11-26 DIAGNOSIS — N401 Enlarged prostate with lower urinary tract symptoms: Secondary | ICD-10-CM

## 2017-11-26 LAB — URINALYSIS, COMPLETE
Bilirubin, UA: NEGATIVE
GLUCOSE, UA: NEGATIVE
Ketones, UA: NEGATIVE
Leukocytes, UA: NEGATIVE
Nitrite, UA: NEGATIVE
PH UA: 6 (ref 5.0–7.5)
PROTEIN UA: NEGATIVE
Specific Gravity, UA: 1.01 (ref 1.005–1.030)
UUROB: 0.2 mg/dL (ref 0.2–1.0)

## 2017-11-26 LAB — MICROSCOPIC EXAMINATION
BACTERIA UA: NONE SEEN
Epithelial Cells (non renal): NONE SEEN /hpf (ref 0–10)
WBC UA: NONE SEEN /HPF (ref 0–?)

## 2017-11-26 MED ORDER — MIRABEGRON ER 25 MG PO TB24: 25.0000 mg | ORAL_TABLET | Freq: Every day | ORAL | 0 refills | Status: DC

## 2017-11-26 NOTE — Progress Notes (Signed)
11/26/2017 12:27 PM   Shawn Khan Sep 06, 1941 [id number]   Chief Complaint  Patient presents with  . Hematuria    HPI: Shawn Khan is a 76 y.o male who presents for evaluation of microhematuria.  He was seen at [facility] on 11/21/2017 complaining of a one-month history of bilateral low back pain which was felt to be musculoskeletal in etiology.  He also had complained of increased odor of his external genitalia and has a history of balanitis.  He was noted to have erythema of the glans penis and was treated with a single course of Diflucan.  His urinalysis did show moderate blood on dipstick with 6-30 RBCs and urology evaluation was recommended.  He is on tamsulosin for BPH.  He is seen regularly by a urologic practice in [place], [place] where he lives.  He spends some time in this area visiting his daughter.  He does have bothersome urinary frequency.  He denies gross hematuria or prior history of stone disease.   PMH: Past Medical History:  Diagnosis Date  . Complication of anesthesia    h/o nasal intubation x 1 hoarseness x 1 week  . 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 Hemorrhagic stroke ([place])    more than 5 years ago  . Sleep apnea    cpap    Surgical History: Past Surgical History:  Procedure Laterality Date  . APPENDECTOMY    . FEMUR FRACTURE SURGERY    . FRACTURE SURGERY    . LARYNGOSCOPY  09/17/2017   Procedure: LARYNGOSCOPY;  Surgeon: Beverly Gust, MD;  Location: [facility] ORS;  Service: ENT;;  . rcr Bilateral   . SHOULDER SURGERY     x3  . THROAT SURGERY     vocal cord nodule  . TONSILLECTOMY      Home Medications:  Allergies as of 11/26/2017   No Known Allergies     Medication List        Accurate as of 11/26/17 12:27 PM. Always use your most recent med list.          HM MULTIVITAMIN ADULT GUMMY Chew Chew 1 tablet by mouth daily.   lisinopril 20 MG tablet Commonly known as:  PRINIVIL,ZESTRIL Take 20 mg by mouth daily.     OVER THE COUNTER MEDICATION Take 1 capsule by mouth daily. Forskolin  Weight Loss supplement   tamsulosin 0.4 MG Caps capsule Commonly known as:  FLOMAX Take 0.4 mg by mouth 2 (two) times daily.       Allergies: No Known Allergies  Family History: Family History  Problem Relation Age of Onset  . Prostate cancer Neg Hx   . Bladder Cancer Neg Hx   . Kidney cancer Neg Hx     Social History:  reports that  has never smoked. he has never used smokeless tobacco. He reports that he does not drink alcohol or use drugs.  ROS: UROLOGY Frequent Urination?: Yes Hard to postpone urination?: Yes Burning/pain with urination?: No Get up at night to urinate?: Yes Leakage of urine?: Yes Urine stream starts and stops?: No Trouble starting stream?: No Do you have to strain to urinate?: No Blood in urine?: No Urinary tract infection?: No Sexually transmitted disease?: No Injury to kidneys or bladder?: No Painful intercourse?: No Weak stream?: Yes Erection problems?: Yes Penile pain?: No  Gastrointestinal Nausea?: No Vomiting?: No Indigestion/heartburn?: No Diarrhea?: No Constipation?: No  Constitutional Fever: No Night sweats?: No Weight loss?: Yes Fatigue?: No  Skin  Skin rash/lesions?: No Itching?: Yes  Eyes Blurred vision?: No Double vision?: No  Ears/Nose/Throat Sore throat?: No Sinus problems?: No  Hematologic/Lymphatic Swollen glands?: No Easy bruising?: No  Cardiovascular Leg swelling?: Yes Chest pain?: No  Respiratory Cough?: Yes Shortness of breath?: No  Endocrine Excessive thirst?: No  Musculoskeletal Back pain?: Yes Joint pain?: Yes  Neurological Headaches?: No Dizziness?: No  Psychologic Depression?: No Anxiety?: No  Physical Exam: BP 125/77 (BP Location: Right Arm, Patient Position: Sitting, Cuff Size: Normal)   Pulse 69   Ht 5\' 10"  (1.778 m)   Wt 180 lb 8 oz (81.9 kg)   BMI 25.90 kg/m   Constitutional:  Alert and oriented,  No acute distress. HEENT: [place] AT, moist mucus membranes.  Trachea midline, no masses. Cardiovascular: No clubbing, cyanosis, or edema. Respiratory: Normal respiratory effort, no increased work of breathing. GI: Abdomen is soft, nontender, nondistended, no abdominal masses GU: No CVA tenderness.  Penis uncircumcised, prepuce easily retracts.  Erythema of the distal glans.  Testes descended bilaterally without masses or tenderness. Skin: No rashes, bruises or suspicious lesions. Lymph: No cervical or inguinal adenopathy. Neurologic: Grossly intact, no focal deficits, moving all 4 extremities. Psychiatric: Normal mood and affect.  Laboratory Data: Lab Results  Component Value Date   WBC 6.7 09/11/2017   HGB 14.4 09/11/2017   HCT 42.4 09/11/2017   MCV 96.2 09/11/2017   PLT 191 09/11/2017    Lab Results  Component Value Date   CREATININE 0.90 09/22/2013    Urinalysis Lab Results  Component Value Date   APPEARANCEUR CLEAR 11/21/2017   LEUKOCYTESUR NEGATIVE 11/21/2017   PROTEINUR NEGATIVE 11/21/2017   GLUCOSEU NEGATIVE 11/21/2017   RBCU 6-30 11/21/2017   BILIRUBINUR NEGATIVE 11/21/2017   NITRITE NEGATIVE 11/21/2017    Lab Results  Component Value Date   BACTERIA RARE (A) 11/21/2017    Assessment & Plan:    1. Microhematuria He has voiding through the foreskin and the microhematuria may be secondary to his balanitis.  He does have Lotrisone and will use as directed for 7 days.  Recommend a follow-up urinalysis in approximately 4 weeks either here or at his urologic practice in [place].  Would recommend a microhematuria evaluation for persistent hematuria.   - Urinalysis, Complete  2. Balanitis  If no change in his glanular erythema would consider penile biopsy.  3. Urinary frequency Trial of Myrbetriq 25 mg daily-samples given  4. Benign prostatic hyperplasia with lower urinary tract symptoms, symptom details unspecified   Return in about 6 weeks (around 01/07/2018)  for Recheck.  Abbie Sons, [place] [address] [place], [place] [address] [phone]

## 2018-01-07 ENCOUNTER — Ambulatory Visit (INDEPENDENT_AMBULATORY_CARE_PROVIDER_SITE_OTHER): Payer: Medicare Other | Admitting: Urology

## 2018-01-07 ENCOUNTER — Other Ambulatory Visit: Payer: Self-pay

## 2018-01-07 ENCOUNTER — Encounter: Payer: Self-pay | Admitting: Urology

## 2018-01-07 VITALS — BP 115/67 | Ht 70.0 in | Wt 186.2 lb

## 2018-01-07 DIAGNOSIS — R3129 Other microscopic hematuria: Secondary | ICD-10-CM | POA: Diagnosis not present

## 2018-01-07 LAB — URINALYSIS, COMPLETE
Bilirubin, UA: NEGATIVE
Glucose, UA: NEGATIVE
KETONES UA: NEGATIVE
Leukocytes, UA: NEGATIVE
NITRITE UA: NEGATIVE
Protein, UA: NEGATIVE
SPEC GRAV UA: 1.01 (ref 1.005–1.030)
Urobilinogen, Ur: 0.2 mg/dL (ref 0.2–1.0)
pH, UA: 5.5 (ref 5.0–7.5)

## 2018-01-07 LAB — MICROSCOPIC EXAMINATION
Bacteria, UA: NONE SEEN
EPITHELIAL CELLS (NON RENAL): NONE SEEN /HPF (ref 0–10)
WBC, UA: NONE SEEN /hpf (ref 0–?)

## 2018-01-07 MED ORDER — MIRABEGRON ER 50 MG PO TB24: 50.0000 mg | ORAL_TABLET | Freq: Every day | ORAL | 0 refills | Status: DC

## 2018-01-07 NOTE — Progress Notes (Signed)
01/07/2018 2:38 PM   Shawn Khan [birth year] [id number]  Referring provider: No referring provider defined for this encounter.  Chief Complaint  Patient presents with  . Hematuria   Urologic Problem List 1.  Microhematuria 2.  Chronic balanitis 3.  BPH with urinary frequency  HPI: 77 year old male seen on 11/26/2017 for the above problems.  He was given a trial of Myrbetriq 25 mg and saw no significant improvement in his symptoms.  His most bothersome voiding symptoms are urinary frequency and urgency.  He is on tamsulosin.  He has no other complaints today.   PMH: Past Medical History:  Diagnosis Date  . Complication of anesthesia    h/o nasal intubation x 1 hoarseness x 1 week  . 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 Hemorrhagic stroke ([place])    more than 5 years ago  . Sleep apnea    cpap    Surgical History: Past Surgical History:  Procedure Laterality Date  . APPENDECTOMY    . FEMUR FRACTURE SURGERY    . FRACTURE SURGERY    . LARYNGOSCOPY  09/17/2017   Procedure: LARYNGOSCOPY;  Surgeon: Beverly Gust, MD;  Location: [facility] ORS;  Service: ENT;;  . rcr Bilateral   . SHOULDER SURGERY     x3  . THROAT SURGERY     vocal cord nodule  . TONSILLECTOMY      Home Medications:  Allergies as of 01/07/2018   No Known Allergies     Medication List        Accurate as of 01/07/18  2:38 PM. Always use your most recent med list.          HM MULTIVITAMIN ADULT GUMMY Chew Chew 1 tablet by mouth daily.   lisinopril 20 MG tablet Commonly known as:  PRINIVIL,ZESTRIL Take 20 mg by mouth daily.   mirabegron ER 25 MG Tb24 tablet Commonly known as:  MYRBETRIQ Take 1 tablet (25 mg total) by mouth daily.   OVER THE COUNTER MEDICATION Take 1 capsule by mouth daily. Forskolin  Weight Loss supplement   tamsulosin 0.4 MG Caps capsule Commonly known as:  FLOMAX Take 0.4 mg by mouth 2 (two) times daily.       Allergies: No Known Allergies  Family  History: Family History  Problem Relation Age of Onset  . Prostate cancer Neg Hx   . Bladder Cancer Neg Hx   . Kidney cancer Neg Hx     Social History:  reports that  has never smoked. he has never used smokeless tobacco. He reports that he does not drink alcohol or use drugs.  ROS: UROLOGY Frequent Urination?: Yes Hard to postpone urination?: Yes Burning/pain with urination?: No Get up at night to urinate?: Yes Leakage of urine?: Yes Urine stream starts and stops?: No Trouble starting stream?: No Do you have to strain to urinate?: No Blood in urine?: No Urinary tract infection?: No Sexually transmitted disease?: No Injury to kidneys or bladder?: No Painful intercourse?: No Weak stream?: Yes Erection problems?: No Penile pain?: No  Gastrointestinal Nausea?: No Vomiting?: No Indigestion/heartburn?: No Diarrhea?: No Constipation?: No  Constitutional Fever: No Night sweats?: No Weight loss?: No Fatigue?: Yes  Skin Skin rash/lesions?: Yes Itching?: No  Eyes Blurred vision?: No Double vision?: No  Ears/Nose/Throat Sore throat?: No Sinus problems?: Yes  Hematologic/Lymphatic Swollen glands?: No Easy bruising?: No  Cardiovascular Leg swelling?: No Chest pain?: No  Respiratory Cough?: Yes Shortness of breath?: No  Endocrine Excessive thirst?: No  Musculoskeletal Back pain?: Yes Joint pain?: Yes  Neurological Headaches?: No Dizziness?: No  Psychologic Depression?: No Anxiety?: No  Physical Exam: BP 115/67 (BP Location: Right Arm, Patient Position: Sitting, Cuff Size: Normal)   Ht 5\' 10"  (1.778 m)   Wt 186 lb 3.2 oz (84.5 kg)   BMI 26.72 kg/m   Constitutional:  Alert and oriented, No acute distress. HEENT: [place] AT, moist mucus membranes.  Trachea midline, no masses. Cardiovascular: No clubbing, cyanosis, or edema. Respiratory: Normal respiratory effort, no increased work of breathing. GI: Abdomen is soft, nontender, nondistended, no  abdominal masses GU: No CVA tenderness.  Penis uncircumcised with slightly tight prepuce.  Moderate erythema surrounding urethral meatus extending circumferentially for approximately 1 cm. Skin: No rashes, bruises or suspicious lesions. Lymph: No cervical or inguinal adenopathy. Neurologic: Grossly intact, no focal deficits, moving all 4 extremities. Psychiatric: Normal mood and affect.  Laboratory Data: Lab Results  Component Value Date   WBC 6.7 09/11/2017   HGB 14.4 09/11/2017   HCT 42.4 09/11/2017   MCV 96.2 09/11/2017   PLT 191 09/11/2017    Lab Results  Component Value Date   CREATININE 0.90 09/22/2013    Urinalysis Dipstick negative/negative RBC, WBC on microscopy  Assessment & Plan:   1. Microscopic hematuria Urinalysis today with negative blood on dipstick or microscopy.  He did give a additional history of bilateral back pain and groin pain.  Will proceed with a CT urogram.  He wanted to hold off on cystoscopy for now.  - Urinalysis, Complete  2.  Chronic balanitis Although most likely inflammatory did discuss the possibility of malignant and premalignant lesions.  I recommended penile biopsy.  He is scheduled for ENT surgery at [facility] later this month and wants to hold off until after that surgery.  3.  Lower urinary tract symptoms Will request his urology records from [place] for review.  Increase Myrbetriq to 50 mg daily-samples given.    Abbie Sons, [place] [address] [place], [place] [address] [phone]

## 2018-01-13 DIAGNOSIS — K219 Gastro-esophageal reflux disease without esophagitis: Secondary | ICD-10-CM | POA: Diagnosis not present

## 2018-01-13 DIAGNOSIS — Z4682 Encounter for fitting and adjustment of non-vascular catheter: Secondary | ICD-10-CM | POA: Diagnosis not present

## 2018-01-13 DIAGNOSIS — Z23 Encounter for immunization: Secondary | ICD-10-CM | POA: Diagnosis not present

## 2018-01-13 DIAGNOSIS — Z8673 Personal history of transient ischemic attack (TIA), and cerebral infarction without residual deficits: Secondary | ICD-10-CM | POA: Diagnosis not present

## 2018-01-13 DIAGNOSIS — Z79899 Other long term (current) drug therapy: Secondary | ICD-10-CM | POA: Diagnosis not present

## 2018-01-13 DIAGNOSIS — I10 Essential (primary) hypertension: Secondary | ICD-10-CM | POA: Diagnosis not present

## 2018-01-13 DIAGNOSIS — G4733 Obstructive sleep apnea (adult) (pediatric): Secondary | ICD-10-CM | POA: Diagnosis not present

## 2018-01-13 DIAGNOSIS — K225 Diverticulum of esophagus, acquired: Secondary | ICD-10-CM | POA: Diagnosis not present

## 2018-01-14 DIAGNOSIS — Z79899 Other long term (current) drug therapy: Secondary | ICD-10-CM | POA: Diagnosis not present

## 2018-01-14 DIAGNOSIS — I10 Essential (primary) hypertension: Secondary | ICD-10-CM | POA: Diagnosis not present

## 2018-01-14 DIAGNOSIS — G4733 Obstructive sleep apnea (adult) (pediatric): Secondary | ICD-10-CM | POA: Diagnosis not present

## 2018-01-14 DIAGNOSIS — K225 Diverticulum of esophagus, acquired: Secondary | ICD-10-CM | POA: Diagnosis not present

## 2018-01-14 DIAGNOSIS — Z8673 Personal history of transient ischemic attack (TIA), and cerebral infarction without residual deficits: Secondary | ICD-10-CM | POA: Diagnosis not present

## 2018-01-14 DIAGNOSIS — K219 Gastro-esophageal reflux disease without esophagitis: Secondary | ICD-10-CM | POA: Diagnosis not present

## 2018-01-15 DIAGNOSIS — G4733 Obstructive sleep apnea (adult) (pediatric): Secondary | ICD-10-CM | POA: Diagnosis not present

## 2018-01-15 DIAGNOSIS — Z01818 Encounter for other preprocedural examination: Secondary | ICD-10-CM | POA: Diagnosis not present

## 2018-01-15 DIAGNOSIS — K219 Gastro-esophageal reflux disease without esophagitis: Secondary | ICD-10-CM | POA: Diagnosis not present

## 2018-01-15 DIAGNOSIS — R079 Chest pain, unspecified: Secondary | ICD-10-CM | POA: Diagnosis not present

## 2018-01-15 DIAGNOSIS — K225 Diverticulum of esophagus, acquired: Secondary | ICD-10-CM | POA: Diagnosis not present

## 2018-01-15 DIAGNOSIS — I10 Essential (primary) hypertension: Secondary | ICD-10-CM | POA: Diagnosis not present

## 2018-01-15 DIAGNOSIS — Z8673 Personal history of transient ischemic attack (TIA), and cerebral infarction without residual deficits: Secondary | ICD-10-CM | POA: Diagnosis not present

## 2018-01-15 DIAGNOSIS — Z79899 Other long term (current) drug therapy: Secondary | ICD-10-CM | POA: Diagnosis not present

## 2018-01-16 DIAGNOSIS — G4733 Obstructive sleep apnea (adult) (pediatric): Secondary | ICD-10-CM | POA: Diagnosis not present

## 2018-01-16 DIAGNOSIS — Z79899 Other long term (current) drug therapy: Secondary | ICD-10-CM | POA: Diagnosis not present

## 2018-01-16 DIAGNOSIS — K219 Gastro-esophageal reflux disease without esophagitis: Secondary | ICD-10-CM | POA: Diagnosis not present

## 2018-01-16 DIAGNOSIS — I10 Essential (primary) hypertension: Secondary | ICD-10-CM | POA: Diagnosis not present

## 2018-01-16 DIAGNOSIS — Z8673 Personal history of transient ischemic attack (TIA), and cerebral infarction without residual deficits: Secondary | ICD-10-CM | POA: Diagnosis not present

## 2018-01-16 DIAGNOSIS — K225 Diverticulum of esophagus, acquired: Secondary | ICD-10-CM | POA: Diagnosis not present

## 2018-02-05 ENCOUNTER — Other Ambulatory Visit: Payer: Self-pay

## 2018-02-05 MED ORDER — TAMSULOSIN HCL 0.4 MG PO CAPS: 0.4000 mg | ORAL_CAPSULE | Freq: Two times a day (BID) | ORAL | 6 refills | Status: DC

## 2018-02-05 MED ORDER — MIRABEGRON ER 50 MG PO TB24: 50.0000 mg | ORAL_TABLET | Freq: Every day | ORAL | 6 refills | Status: DC

## 2018-02-11 DIAGNOSIS — E119 Type 2 diabetes mellitus without complications: Secondary | ICD-10-CM | POA: Diagnosis not present

## 2018-02-11 DIAGNOSIS — Z961 Presence of intraocular lens: Secondary | ICD-10-CM | POA: Diagnosis not present

## 2018-03-04 ENCOUNTER — Ambulatory Visit
Admission: RE | Admit: 2018-03-04 | Discharge: 2018-03-04 | Disposition: A | Discharge status: Home / Self Care | Payer: Medicare Other | Source: Ambulatory Visit | Attending: Urology | Admitting: Urology

## 2018-03-04 DIAGNOSIS — K5730 Diverticulosis of large intestine without perforation or abscess without bleeding: Secondary | ICD-10-CM | POA: Insufficient documentation

## 2018-03-04 DIAGNOSIS — R3129 Other microscopic hematuria: Secondary | ICD-10-CM | POA: Diagnosis not present

## 2018-03-04 LAB — POCT I-STAT CREATININE: Creatinine, Ser: 0.9 mg/dL (ref 0.61–1.24)

## 2018-03-04 MED ORDER — IOPAMIDOL (ISOVUE-300) INJECTION 61%
125.0000 mL | Freq: Once | INTRAVENOUS | Status: AC | PRN
  Administered 2018-03-04: 125 mL via INTRAVENOUS

## 2018-03-05 ENCOUNTER — Telehealth: Payer: Self-pay

## 2018-03-05 NOTE — Telephone Encounter (Signed)
-----   Message from Abbie Sons, MD sent at 03/04/2018  9:50 PM EDT ----- CT did not show any significant abnormalities. He wanted to hold off on cystoscopy until the CT was performed.  Since there are no abnormalities recommend scheduling cystoscopy.

## 2018-03-05 NOTE — Telephone Encounter (Signed)
Spoke with pt in reference to CT results and needing a cysto. Pt voiced understanding. Cysto appt made.

## 2018-03-07 DIAGNOSIS — Z09 Encounter for follow-up examination after completed treatment for conditions other than malignant neoplasm: Secondary | ICD-10-CM | POA: Diagnosis not present

## 2018-03-07 DIAGNOSIS — K225 Diverticulum of esophagus, acquired: Secondary | ICD-10-CM | POA: Diagnosis not present

## 2018-03-19 ENCOUNTER — Encounter: Payer: Self-pay | Admitting: Urology

## 2018-03-19 ENCOUNTER — Ambulatory Visit (INDEPENDENT_AMBULATORY_CARE_PROVIDER_SITE_OTHER): Payer: Medicare Other | Admitting: Urology

## 2018-03-19 VITALS — BP 106/66 | HR 59 | Resp 16 | Ht 70.0 in | Wt 183.2 lb

## 2018-03-19 DIAGNOSIS — R3129 Other microscopic hematuria: Secondary | ICD-10-CM | POA: Diagnosis not present

## 2018-03-19 LAB — URINALYSIS, COMPLETE
Bilirubin, UA: NEGATIVE
GLUCOSE, UA: NEGATIVE
KETONES UA: NEGATIVE
LEUKOCYTES UA: NEGATIVE
NITRITE UA: NEGATIVE
Protein, UA: NEGATIVE
SPEC GRAV UA: 1.01 (ref 1.005–1.030)
Urobilinogen, Ur: 0.2 mg/dL (ref 0.2–1.0)
pH, UA: 5.5 (ref 5.0–7.5)

## 2018-03-19 MED ORDER — CIPROFLOXACIN HCL 500 MG PO TABS
500.0000 mg | ORAL_TABLET | Freq: Once | ORAL | Status: AC
  Administered 2018-03-19: 500 mg via ORAL

## 2018-03-19 MED ORDER — LIDOCAINE HCL 2 % EX GEL
1.0000 "application " | Freq: Once | CUTANEOUS | Status: AC
  Administered 2018-03-19: 1 via URETHRAL

## 2018-03-20 ENCOUNTER — Encounter: Payer: Self-pay | Admitting: Urology

## 2018-03-20 NOTE — Progress Notes (Signed)
   03/20/18  CC: No chief complaint on file.   HPI: 77 year old male with lower urinary tract symptoms and microhematuria.  CT urogram performed on 03/04/2018 showed bilateral simple renal cysts.  No upper tract abnormalities were identified.  Blood pressure 106/66, pulse (!) 59, resp. rate 16, height 5\' 10"  (1.778 m), weight 183 lb 3.2 oz (83.1 kg), SpO2 98 %. NED. A&Ox3.   No respiratory distress   Abd soft, NT, ND Penis is uncircumcised.  Significant peri-meatal erythema remains.  Cystoscopy Procedure Note  Patient identification was confirmed, informed consent was obtained, and patient was prepped using Betadine solution.  Lidocaine jelly was administered per urethral meatus.    Preoperative abx where received prior to procedure.     Pre-Procedure: - Inspection reveals a normal caliber ureteral meatus.  Procedure: The flexible cystoscope was introduced without difficulty - No urethral strictures/lesions are present. - Lateral lobe enlargement prostate with prominent hypervascularity - Moderate bladder neck - Bilateral ureteral orifices identified - Bladder mucosa  reveals no ulcers, tumors, or lesions - No bladder stones - No trabeculation  Retroflexion shows elevation no intravesical median lobe or other abnormalities   Post-Procedure: - Patient tolerated the procedure well  Assessment/ Plan: BPH is the most likely source of his microhematuria.  No significant abnormalities were identified.  He remains on Myrbetriq however states his co-pay is $400 per month and he has only seen minimal improvement.  Would recommend he discontinue this medication and continue the tamsulosin.  He has chronic.  Meatal erythema which is most likely inflammatory.  We discussed the possibility of carcinoma in situ or premalignant lesions and due to the chronicity of this problem would recommend a penile biopsy.  He was in agreement and desires to schedule.

## 2018-04-16 ENCOUNTER — Ambulatory Visit (INDEPENDENT_AMBULATORY_CARE_PROVIDER_SITE_OTHER): Payer: Medicare Other | Admitting: Urology

## 2018-04-16 ENCOUNTER — Other Ambulatory Visit: Payer: Self-pay | Admitting: Urology

## 2018-04-16 ENCOUNTER — Encounter: Payer: Self-pay | Admitting: Urology

## 2018-04-16 VITALS — BP 99/62 | HR 64 | Resp 16 | Ht 70.0 in | Wt 184.0 lb

## 2018-04-16 DIAGNOSIS — N489 Disorder of penis, unspecified: Secondary | ICD-10-CM | POA: Diagnosis not present

## 2018-04-16 DIAGNOSIS — L98499 Non-pressure chronic ulcer of skin of other sites with unspecified severity: Secondary | ICD-10-CM | POA: Diagnosis not present

## 2018-04-16 NOTE — Progress Notes (Signed)
Procedure Note  Preprocedure diagnosis: Chronic peri-meatal erythema Post procedure diagnosis: Same Procedure: Penile biopsy Anesthesia: 1% plain Xylocaine-1 mL EBL: Minimal Description: The penis was prepped and draped sterilely.  On the left glands the area of erythema was anesthetized with 1% plain Xylocaine.  A small shave biopsy was performed and closed with interrupted 3-0 chromic suture.  There were no complications.  Plan: He will keep the area dry for 48 hours.  He will be notified with the pathology result.

## 2018-04-23 ENCOUNTER — Encounter: Payer: Self-pay | Admitting: Urology

## 2018-04-23 ENCOUNTER — Ambulatory Visit (INDEPENDENT_AMBULATORY_CARE_PROVIDER_SITE_OTHER): Payer: Medicare Other | Admitting: Urology

## 2018-04-23 ENCOUNTER — Other Ambulatory Visit: Payer: Self-pay | Admitting: Urology

## 2018-04-23 VITALS — BP 107/64 | HR 62 | Ht 70.0 in | Wt 183.8 lb

## 2018-04-23 DIAGNOSIS — R0683 Snoring: Secondary | ICD-10-CM

## 2018-04-23 DIAGNOSIS — N489 Disorder of penis, unspecified: Secondary | ICD-10-CM

## 2018-04-23 DIAGNOSIS — L821 Other seborrheic keratosis: Secondary | ICD-10-CM | POA: Insufficient documentation

## 2018-04-23 DIAGNOSIS — R3915 Urgency of urination: Secondary | ICD-10-CM | POA: Insufficient documentation

## 2018-04-23 DIAGNOSIS — G4733 Obstructive sleep apnea (adult) (pediatric): Secondary | ICD-10-CM | POA: Insufficient documentation

## 2018-04-23 DIAGNOSIS — M159 Polyosteoarthritis, unspecified: Secondary | ICD-10-CM | POA: Insufficient documentation

## 2018-04-23 LAB — PATHOLOGY

## 2018-04-23 NOTE — Progress Notes (Signed)
04/23/2018 12:40 PM   Shawn Khan 01-25-1941 [id number]  Referring provider: No referring provider defined for this encounter.  Chief Complaint  Patient presents with  . Follow-up    f/u penile bx, pt states that stitches have come out    HPI: 77 year old male who underwent penile biopsy for chronic peri-meatal erythema on 04/16/2018.  He wanted to have the area checked because he noted some whitish material.   PMH: Past Medical History:  Diagnosis Date  . Complication of anesthesia    h/o nasal intubation x 1 hoarseness x 1 week  . 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 Hemorrhagic stroke ([place])    more than 5 years ago  . Sleep apnea    cpap    Surgical History: Past Surgical History:  Procedure Laterality Date  . APPENDECTOMY    . FEMUR FRACTURE SURGERY    . FRACTURE SURGERY    . LARYNGOSCOPY  09/17/2017   Procedure: LARYNGOSCOPY;  Surgeon: Beverly Gust, MD;  Location: [facility] ORS;  Service: ENT;;  . rcr Bilateral   . SHOULDER SURGERY     x3  . THROAT SURGERY     vocal cord nodule  . TONSILLECTOMY      Home Medications:  Allergies as of 04/23/2018   No Known Allergies     Medication List        Accurate as of 04/23/18 12:40 PM. Always use your most recent med list.          calcium gluconate 500 MG tablet Take by mouth.   co-enzyme Q-10 30 MG capsule Take by mouth.   glucosamine-chondroitin 500-400 MG tablet Take by mouth.   HM MULTIVITAMIN ADULT GUMMY Chew Chew 1 tablet by mouth daily.   lisinopril 20 MG tablet Commonly known as:  PRINIVIL,ZESTRIL Take 20 mg by mouth daily.   mirabegron ER 50 MG Tb24 tablet Commonly known as:  MYRBETRIQ Take 1 tablet (50 mg total) by mouth daily.   mirabegron ER 50 MG Tb24 tablet Commonly known as:  MYRBETRIQ Take 1 tablet (50 mg total) by mouth daily.   OVER THE COUNTER MEDICATION Take 1 capsule by mouth daily. Forskolin  Weight Loss supplement   rosuvastatin 10 MG tablet Commonly known as:   CRESTOR Take by mouth.   tamsulosin 0.4 MG Caps capsule Commonly known as:  FLOMAX Take 1 capsule (0.4 mg total) by mouth 2 (two) times daily.   zolpidem 10 MG tablet Commonly known as:  AMBIEN Take by mouth.       Allergies: No Known Allergies  Family History: Family History  Problem Relation Age of Onset  . Prostate cancer Neg Hx   . Bladder Cancer Neg Hx   . Kidney cancer Neg Hx     Social History:  reports that he has never smoked. He has never used smokeless tobacco. He reports that he does not drink alcohol or use drugs.  ROS: Otherwise noncontributory  Physical Exam: BP 107/64 (BP Location: Right Arm, Patient Position: Sitting, Cuff Size: Large)   Pulse 62   Ht 5\' 10"  (1.778 m)   Wt 183 lb 12.8 oz (83.4 kg)   SpO2 99%   BMI 26.37 kg/m   Constitutional:  Alert and oriented, No acute distress. GU: Some thoroughness accident at biopsy site.  No active bleeding or evidence of infection   Assessment & Plan:   His pathology report is pending due to special staining.  He will be notified once the  final pathology is received.  Biopsy site is healing as expected.  Abbie Sons, [place] [address], [place], [place] [address] [phone]

## 2018-04-25 ENCOUNTER — Telehealth: Payer: Self-pay

## 2018-04-25 NOTE — Telephone Encounter (Signed)
Pt informed, please refer to Derm.

## 2018-04-25 NOTE — Telephone Encounter (Signed)
-----   Message from Abbie Sons, MD sent at 04/24/2018  7:00 PM EDT ----- Penile biopsy showed inflammatory changes and no evidence of cancer.  Based on the diagnosis would recommend a dermatology opinion regarding medical treatment.

## 2018-04-30 ENCOUNTER — Telehealth: Payer: Self-pay | Admitting: Urology

## 2018-04-30 NOTE — Telephone Encounter (Signed)
Referral has been entered and faxed to [facility].  Shawn Khan

## 2018-06-24 ENCOUNTER — Other Ambulatory Visit: Payer: Self-pay | Admitting: Family Medicine

## 2018-06-24 MED ORDER — TAMSULOSIN HCL 0.4 MG PO CAPS: 0.4000 mg | ORAL_CAPSULE | Freq: Two times a day (BID) | ORAL | 0 refills | Status: DC

## 2018-06-24 NOTE — Telephone Encounter (Signed)
RX was corrected and sent to pharmacy in [facility]

## 2018-06-25 DIAGNOSIS — Z8719 Personal history of other diseases of the digestive system: Secondary | ICD-10-CM | POA: Diagnosis not present

## 2018-06-25 DIAGNOSIS — E782 Mixed hyperlipidemia: Secondary | ICD-10-CM | POA: Diagnosis not present

## 2018-06-25 DIAGNOSIS — I10 Essential (primary) hypertension: Secondary | ICD-10-CM | POA: Diagnosis not present

## 2018-06-25 DIAGNOSIS — G4733 Obstructive sleep apnea (adult) (pediatric): Secondary | ICD-10-CM | POA: Diagnosis not present

## 2018-06-25 DIAGNOSIS — Z125 Encounter for screening for malignant neoplasm of prostate: Secondary | ICD-10-CM | POA: Diagnosis not present

## 2018-06-25 DIAGNOSIS — R7301 Impaired fasting glucose: Secondary | ICD-10-CM | POA: Diagnosis not present

## 2018-06-25 DIAGNOSIS — R5383 Other fatigue: Secondary | ICD-10-CM | POA: Diagnosis not present

## 2018-06-25 DIAGNOSIS — M810 Age-related osteoporosis without current pathological fracture: Secondary | ICD-10-CM | POA: Diagnosis not present

## 2018-09-12 DIAGNOSIS — R1310 Dysphagia, unspecified: Secondary | ICD-10-CM | POA: Diagnosis not present

## 2018-09-12 DIAGNOSIS — K225 Diverticulum of esophagus, acquired: Secondary | ICD-10-CM | POA: Diagnosis not present

## 2018-09-12 DIAGNOSIS — Z09 Encounter for follow-up examination after completed treatment for conditions other than malignant neoplasm: Secondary | ICD-10-CM | POA: Diagnosis not present

## 2018-09-12 DIAGNOSIS — K219 Gastro-esophageal reflux disease without esophagitis: Secondary | ICD-10-CM | POA: Diagnosis not present

## 2018-09-12 DIAGNOSIS — Z8719 Personal history of other diseases of the digestive system: Secondary | ICD-10-CM | POA: Diagnosis not present

## 2018-09-16 ENCOUNTER — Encounter

## 2018-09-16 ENCOUNTER — Ambulatory Visit (INDEPENDENT_AMBULATORY_CARE_PROVIDER_SITE_OTHER): Payer: Medicare Other | Admitting: Internal Medicine

## 2018-09-16 ENCOUNTER — Encounter: Payer: Self-pay | Admitting: Internal Medicine

## 2018-09-16 VITALS — BP 106/10 | HR 57 | Temp 98.9°F | Ht 70.0 in | Wt 181.8 lb

## 2018-09-16 DIAGNOSIS — R350 Frequency of micturition: Secondary | ICD-10-CM | POA: Diagnosis not present

## 2018-09-16 DIAGNOSIS — Z1283 Encounter for screening for malignant neoplasm of skin: Secondary | ICD-10-CM | POA: Diagnosis not present

## 2018-09-16 DIAGNOSIS — M810 Age-related osteoporosis without current pathological fracture: Secondary | ICD-10-CM

## 2018-09-16 DIAGNOSIS — I10 Essential (primary) hypertension: Secondary | ICD-10-CM | POA: Diagnosis not present

## 2018-09-16 DIAGNOSIS — K2270 Barrett's esophagus without dysplasia: Secondary | ICD-10-CM

## 2018-09-16 DIAGNOSIS — N401 Enlarged prostate with lower urinary tract symptoms: Secondary | ICD-10-CM | POA: Diagnosis not present

## 2018-09-16 DIAGNOSIS — N485 Ulcer of penis: Secondary | ICD-10-CM | POA: Diagnosis not present

## 2018-09-16 DIAGNOSIS — E782 Mixed hyperlipidemia: Secondary | ICD-10-CM

## 2018-09-16 DIAGNOSIS — K225 Diverticulum of esophagus, acquired: Secondary | ICD-10-CM | POA: Insufficient documentation

## 2018-09-16 DIAGNOSIS — N138 Other obstructive and reflux uropathy: Secondary | ICD-10-CM

## 2018-09-16 DIAGNOSIS — R7303 Prediabetes: Secondary | ICD-10-CM

## 2018-09-16 MED ORDER — LISINOPRIL 10 MG PO TABS: 10.0000 mg | ORAL_TABLET | Freq: Every day | ORAL | 3 refills | Status: DC

## 2018-09-16 MED ORDER — TAMSULOSIN HCL 0.4 MG PO CAPS: 0.4000 mg | ORAL_CAPSULE | Freq: Two times a day (BID) | ORAL | 3 refills | Status: DC

## 2018-09-16 NOTE — Patient Instructions (Addendum)
Vitamin D3 1000-2000 IU daily  Calcium 1200 mg total daily   Cut lisinopril down to 20 mg to 10 mg daily   Osteoporosis Osteoporosis is the thinning and loss of density in the bones. Osteoporosis makes the bones more brittle, fragile, and likely to break (fracture). Over time, osteoporosis can cause the bones to become so weak that they fracture after a simple fall. The bones most likely to fracture are the bones in the hip, wrist, and spine. What are the causes? The exact cause is not known. What increases the risk? Anyone can develop osteoporosis. You may be at greater risk if you have a family history of the condition or have poor nutrition. You may also have a higher risk if you are:  Male.  78 years old or older.  A smoker.  Not physically active.  White or Asian.  Slender.  What are the signs or symptoms? A fracture might be the first sign of the disease, especially if it results from a fall or injury that would not usually cause a bone to break. Other signs and symptoms include:  Low back and neck pain.  Stooped posture.  Height loss.  How is this diagnosed? To make a diagnosis, your health care provider may:  Take a medical history.  Perform a physical exam.  Order tests, such as: ? A bone mineral density test. ? A dual-energy X-ray absorptiometry test.  How is this treated? The goal of osteoporosis treatment is to strengthen your bones to reduce your risk of a fracture. Treatment may involve:  Making lifestyle changes, such as: ? Eating a diet rich in calcium. ? Doing weight-bearing and muscle-strengthening exercises. ? Stopping tobacco use. ? Limiting alcohol intake.  Taking medicine to slow the process of bone loss or to increase bone density.  Monitoring your levels of calcium and vitamin D.  Follow these instructions at home:  Include calcium and vitamin D in your diet. Calcium is important for bone health, and vitamin D helps the body absorb  calcium.  Perform weight-bearing and muscle-strengthening exercises as directed by your health care provider.  Do not use any tobacco products, including cigarettes, chewing tobacco, and electronic cigarettes. If you need help quitting, ask your health care provider.  Limit your alcohol intake.  Take medicines only as directed by your health care provider.  Keep all follow-up visits as directed by your health care provider. This is important.  Take precautions at home to lower your risk of falling, such as: ? Keeping rooms well lit and clutter free. ? Installing safety rails on stairs. ? Using rubber mats in the bathroom and other areas that are often wet or slippery. Get help right away if: You fall or injure yourself. This information is not intended to replace advice given to you by your health care provider. Make sure you discuss any questions you have with your health care provider. Document Released: 09/12/2005 Document Revised: 05/07/2016 Document Reviewed: 05/13/2014 Elsevier Interactive Patient Education  [employer].

## 2018-09-16 NOTE — Progress Notes (Signed)
Pre visit review using our clinic review tool, if applicable. No additional management support is needed unless otherwise documented below in the visit note. 

## 2018-09-16 NOTE — Progress Notes (Addendum)
Chief Complaint  Patient presents with  . Establish Care   New patient with wife and 78 y.o granddaughter  55. HTN low feeling fatigue on lis 20 mg qd just had labs with 2nd PCP in [place] months ago will request copy  2. HLD did not tolerate crestor in the past  3. H/o zenkers 1.7 cm diverticulum pending CT neck 09/19/18 and f/u [facility]  4. Penile ulceration and lichenoid inflammation saw urology will refer to dermatology to f/u and do tbse  5. Osteoporosis on fosamax 2 more weeks and will be on total 3 months taking supplements with calcium and vitamin D   Review of Systems  Constitutional: Positive for malaise/fatigue. Negative for weight loss.  HENT: Positive for hearing loss.        Wears hearing aids    Eyes: Negative for blurred vision.  Respiratory: Negative for shortness of breath.   Cardiovascular: Negative for chest pain.  Gastrointestinal: Negative for abdominal pain.  Musculoskeletal: Negative for falls.  Skin: Negative for rash.  Neurological: Negative for headaches.  Psychiatric/Behavioral: Positive for memory loss. Negative for depression.   Past Medical History:  Diagnosis Date  . Complication of anesthesia    h/o nasal intubation x 1 hoarseness x 1 week  . 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 Hemorrhagic stroke ([place])    more than 5 years ago  . Sleep apnea    cpap   Past Surgical History:  Procedure Laterality Date  . APPENDECTOMY    . FEMUR FRACTURE SURGERY    . FRACTURE SURGERY    . LARYNGOSCOPY  09/17/2017   Procedure: LARYNGOSCOPY;  Surgeon: Beverly Gust, MD;  Location: [facility] ORS;  Service: ENT;;  . rcr Bilateral   . SHOULDER SURGERY     x3  . THROAT SURGERY     vocal cord nodule  . TONSILLECTOMY     Family History  Problem Relation Age of Onset  . Prostate cancer Neg Hx   . Bladder Cancer Neg Hx   . Kidney cancer Neg Hx    Social History   Socioeconomic History  . Marital status: Married    Spouse name: Not on file  . Number of children:  Not on file  . Years of education: Not on file  . Highest education level: Not on file  Occupational History  . Not on file  Social Needs  . Financial resource strain: Not on file  . Food insecurity:    Worry: Not on file    Inability: Not on file  . Transportation needs:    Medical: Not on file    Non-medical: Not on file  Tobacco Use  . Smoking status: Never Smoker  . Smokeless tobacco: Never Used  Substance and Sexual Activity  . Alcohol use: No  . Drug use: No  . Sexual activity: Not Currently  Lifestyle  . Physical activity:    Days per week: Not on file    Minutes per session: Not on file  . Stress: Not on file  Relationships  . Social connections:    Talks on phone: Not on file    Gets together: Not on file    Attends religious service: Not on file    Active member of club or organization: Not on file    Attends meetings of clubs or organizations: Not on file    Relationship status: Not on file  . Intimate partner violence:    Fear of current or ex  partner: Not on file    Emotionally abused: Not on file    Physically abused: Not on file    Forced sexual activity: Not on file  Other Topics Concern  . Not on file  Social History Narrative  . Not on file   Current Meds  Medication Sig  . lisinopril (PRINIVIL,ZESTRIL) 20 MG tablet Take 20 mg by mouth daily.  . Multiple Vitamins-Minerals (HM MULTIVITAMIN ADULT GUMMY) CHEW Chew 1 tablet by mouth daily.   . tamsulosin (FLOMAX) 0.4 MG CAPS capsule Take 1 capsule (0.4 mg total) by mouth 2 (two) times daily.   No Known Allergies No results found for this or any previous visit (from the past 2160 hour(s)). Objective  Body mass index is 26.09 kg/m. Wt Readings from Last 3 Encounters:  09/16/18 181 lb 12.8 oz (82.5 kg)  04/23/18 183 lb 12.8 oz (83.4 kg)  04/16/18 184 lb (83.5 kg)   Temp Readings from Last 3 Encounters:  09/16/18 98.9 F (37.2 C) (Oral)  11/21/17 98 F (36.7 C) (Oral)  09/17/17 (!) 96.8 F (36  C)   BP Readings from Last 3 Encounters:  09/16/18 (!) 106/10  04/23/18 107/64  04/16/18 99/62   Pulse Readings from Last 3 Encounters:  09/16/18 (!) 57  04/23/18 62  04/16/18 64    Physical Exam  Constitutional: He is oriented to person, place, and time. Vital signs are normal. He appears well-developed and well-nourished. He is cooperative.  HENT:  Head: Normocephalic and atraumatic.  Mouth/Throat: Oropharynx is clear and moist and mucous membranes are normal.  Eyes: Pupils are equal, round, and reactive to light. Conjunctivae are normal.  Cardiovascular: Normal rate, regular rhythm and normal heart sounds.  Pulmonary/Chest: Effort normal and breath sounds normal.  Neurological: He is alert and oriented to person, place, and time. Gait normal.  Skin: Skin is warm, dry and intact.  Psychiatric: He has a normal mood and affect. His speech is normal and behavior is normal. Judgment and thought content normal. Cognition and memory are normal.  Nursing note and vitals reviewed.   Assessment   1.HTN/HLD. BP low normal with fatigue  2. BPH and penile ulcer with lichenoid infllammation, h/o low testosterone  3. Zenkers diverticulum 1.7 cm  4. HM Plan   1. Reduce lis 20 to 10 mg qd  Now off norvasc prior PCP dc'ed  F/u in 3 months  Side effect from crestor in past  Wanted to check labs CMET, CBC, A1C, TSH had 11/20/17 normal, UA h/o hematuria (CT renal 03/04/18 negative), MMR, vitamin d, lipid (fast at least 8 hours h/o hypoglycemia) per pt just had labs with PCP requested copy  2. Refilled flomax 0.4 bid  Taking otc high T supplement for testosterone Refer dermatology penile lesion and tbse  Consider f/u with urology Dr. Bernardo Heater if bph sx's not controlled  3. Pending CT neck 09/19/18 and f/u [facility]  4.  Declines flu shot  Tdap had 08/17/13  prevnar 11/05/14  pna 23 09/30/15  Consider shingrix in future   Never smoker  PSA 0.69 11/20/17  Colonoscopy 04/11/17 diverticulosis f/u  10 years Dr. 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 Renelda Loma requested records  -reviewed records saw 06/25/18 -h/o low testosterone, h/o hematuria -colonoscopy per records had 04/11/17  Labs 06/25/18 CMET, CBC, lipid abnormal, TSH, PSA: A1C 5.9 prediabetes,  -TSH 1.6277 06/25/18, WBC 5.23, H/H 13.1/40.1, plts 163, glucose 139/A1C 5.9 prediabetes, cmet K4.2 BUN 19, Cr 0.89 GFR 83 , CPK 93, TC 237, TG 250, LDL 135, HDL 52, PSA 0.495  -will check UA in future H/o balanitis   Eye Dr. Almetta Lovely [place]  [facility] [place]   Provider: Dr. Olivia Mackie McLean-Scocuzza-Internal Medicine

## 2018-09-19 DIAGNOSIS — K225 Diverticulum of esophagus, acquired: Secondary | ICD-10-CM | POA: Diagnosis not present

## 2018-10-03 ENCOUNTER — Encounter: Payer: Self-pay | Admitting: Internal Medicine

## 2018-10-03 DIAGNOSIS — K2270 Barrett's esophagus without dysplasia: Secondary | ICD-10-CM | POA: Insufficient documentation

## 2018-10-03 DIAGNOSIS — R7303 Prediabetes: Secondary | ICD-10-CM | POA: Insufficient documentation

## 2018-11-28 DIAGNOSIS — K225 Diverticulum of esophagus, acquired: Secondary | ICD-10-CM | POA: Diagnosis not present

## 2018-12-18 ENCOUNTER — Telehealth: Payer: Self-pay | Admitting: Radiology

## 2018-12-18 ENCOUNTER — Encounter: Payer: Self-pay | Admitting: Internal Medicine

## 2018-12-18 ENCOUNTER — Ambulatory Visit (INDEPENDENT_AMBULATORY_CARE_PROVIDER_SITE_OTHER): Payer: Medicare Other | Admitting: Internal Medicine

## 2018-12-18 VITALS — BP 114/76 | HR 52 | Temp 97.7°F | Ht 70.0 in | Wt 181.2 lb

## 2018-12-18 DIAGNOSIS — R001 Bradycardia, unspecified: Secondary | ICD-10-CM

## 2018-12-18 DIAGNOSIS — Z1329 Encounter for screening for other suspected endocrine disorder: Secondary | ICD-10-CM

## 2018-12-18 DIAGNOSIS — R7303 Prediabetes: Secondary | ICD-10-CM | POA: Diagnosis not present

## 2018-12-18 DIAGNOSIS — E785 Hyperlipidemia, unspecified: Secondary | ICD-10-CM | POA: Diagnosis not present

## 2018-12-18 DIAGNOSIS — R3129 Other microscopic hematuria: Secondary | ICD-10-CM

## 2018-12-18 DIAGNOSIS — E782 Mixed hyperlipidemia: Secondary | ICD-10-CM | POA: Diagnosis not present

## 2018-12-18 DIAGNOSIS — E291 Testicular hypofunction: Secondary | ICD-10-CM

## 2018-12-18 DIAGNOSIS — G4733 Obstructive sleep apnea (adult) (pediatric): Secondary | ICD-10-CM

## 2018-12-18 DIAGNOSIS — M810 Age-related osteoporosis without current pathological fracture: Secondary | ICD-10-CM | POA: Diagnosis not present

## 2018-12-18 DIAGNOSIS — E559 Vitamin D deficiency, unspecified: Secondary | ICD-10-CM

## 2018-12-18 DIAGNOSIS — B356 Tinea cruris: Secondary | ICD-10-CM | POA: Diagnosis not present

## 2018-12-18 DIAGNOSIS — R252 Cramp and spasm: Secondary | ICD-10-CM | POA: Diagnosis not present

## 2018-12-18 DIAGNOSIS — L304 Erythema intertrigo: Secondary | ICD-10-CM | POA: Diagnosis not present

## 2018-12-18 DIAGNOSIS — I10 Essential (primary) hypertension: Secondary | ICD-10-CM

## 2018-12-18 DIAGNOSIS — K225 Diverticulum of esophagus, acquired: Secondary | ICD-10-CM

## 2018-12-18 DIAGNOSIS — R790 Abnormal level of blood mineral: Secondary | ICD-10-CM

## 2018-12-18 DIAGNOSIS — Z1159 Encounter for screening for other viral diseases: Secondary | ICD-10-CM

## 2018-12-18 MED ORDER — CLOTRIMAZOLE 1 % EX CREA: 1.0000 "application " | TOPICAL_CREAM | Freq: Two times a day (BID) | CUTANEOUS | 11 refills | Status: DC

## 2018-12-18 NOTE — Progress Notes (Signed)
Pre visit review using our clinic review tool, if applicable. No additional management support is needed unless otherwise documented below in the visit note. 

## 2018-12-18 NOTE — Patient Instructions (Addendum)
Vitamin D3 2000 total for the day  Calcium 1200 mg 2x per day  Consider prolia injections  Theraworx for leg cramps topical magnesium  Hydrocortisone 1 % OTC right groin area with Clotrimazole alone  Zeasorb AF powder keeps dry in creases       Cholesterol Cholesterol is a white, waxy, fat-like substance that is needed by the human body in small amounts. The liver makes all the cholesterol we need. Cholesterol is carried from the liver by the blood through the blood vessels. Deposits of cholesterol (plaques) may build up on blood vessel (artery) walls. Plaques make the arteries narrower and stiffer. Cholesterol plaques increase the risk for heart attack and stroke. You cannot feel your cholesterol level even if it is very high. The only way to know that it is high is to have a blood test. Once you know your cholesterol levels, you should keep a record of the test results. Work with your health care provider to keep your levels in the desired range. What do the results mean?  Total cholesterol is a rough measure of all the cholesterol in your blood.  LDL (low-density lipoprotein) is the "bad" cholesterol. This is the type that causes plaque to build up on the artery walls. You want this level to be low.  HDL (high-density lipoprotein) is the "good" cholesterol because it cleans the arteries and carries the LDL away. You want this level to be high.  Triglycerides are fat that the body can either burn for energy or store. High levels are closely linked to heart disease. What are the desired levels of cholesterol?  Total cholesterol below 200.  LDL below 100 for people who are at risk, below 70 for people at very high risk.  HDL above 40 is good. A level of 60 or higher is considered to be protective against heart disease.  Triglycerides below 150. How can I lower my cholesterol? Diet Follow your diet program as told by your health care provider.  Choose fish or white meat chicken and  [place], roasted or baked. Limit fatty cuts of red meat, fried foods, and processed meats, such as sausage and lunch meats.  Eat lots of fresh fruits and vegetables.  Choose whole grains, beans, pasta, potatoes, and cereals.  Choose olive oil, corn oil, or canola oil, and use only small amounts.  Avoid butter, mayonnaise, shortening, or palm kernel oils.  Avoid foods with trans fats.  Drink skim or nonfat milk and eat low-fat or nonfat yogurt and cheeses. Avoid whole milk, cream, ice cream, egg yolks, and full-fat cheeses.  Healthier desserts include angel food cake, ginger snaps, animal crackers, hard candy, popsicles, and low-fat or nonfat frozen yogurt. Avoid pastries, cakes, pies, and cookies.  Exercise  Follow your exercise program as told by your health care provider. A regular program: ? Helps to decrease LDL and raise HDL. ? Helps with weight control.  Do things that increase your activity level, such as gardening, walking, and taking the stairs.  Ask your health care provider about ways that you can be more active in your daily life. Medicine  Take over-the-counter and prescription medicines only as told by your health care provider. ? Medicine may be prescribed by your health care provider to help lower cholesterol and decrease the risk for heart disease. This is usually done if diet and exercise have failed to bring down cholesterol levels. ? If you have several risk factors, you may need medicine even if your levels are normal.  This information is not intended to replace advice given to you by your health care provider. Make sure you discuss any questions you have with your health care provider. Document Released: 08/28/2001 Document Revised: 06/30/2016 Document Reviewed: 06/02/2016 Elsevier Interactive Patient Education  2019 [place].    Leg Cramps Leg cramps occur when one or more muscles tighten and you have no control over this tightening (involuntary muscle  contraction). Muscle cramps can develop in any muscle, but the most common place is in the calf muscles of the leg. Those cramps can occur during exercise or when you are at rest. Leg cramps are painful, and they may last for a few seconds to a few minutes. Cramps may return several times before they finally stop. Usually, leg cramps are not caused by a serious medical problem. In many cases, the cause is not known. Some common causes include:  Excessive physical effort (overexertion), such as during intense exercise.  Overuse from repetitive motions, or doing the same thing over and over.  Staying in a certain position for a long period of time.  Improper preparation, form, or technique while performing a sport or an activity.  Dehydration.  Injury.  Side effects of certain medicines.  Abnormally low levels of minerals in your blood (electrolytes), especially potassium and calcium. This could result from: ? Pregnancy. ? Taking diuretic medicines. Follow these instructions at home: Eating and drinking  Drink enough fluid to keep your urine pale yellow. Staying hydrated may help prevent cramps.  Eat a healthy diet that includes plenty of nutrients to help your muscles function. A healthy diet includes fruits and vegetables, lean protein, whole grains, and low-fat or nonfat dairy products. Managing pain, stiffness, and swelling      Try massaging, stretching, and relaxing the affected muscle. Do this for several minutes at a time.  If directed, put ice on areas that are sore or painful after a cramp: ? Put ice in a plastic bag. ? Place a towel between your skin and the bag. ? Leave the ice on for 20 minutes, 2-3 times a day.  If directed, apply heat to muscles that are tense or tight. Do this before you exercise, or as often as told by your health care provider. Use the heat source that your health care provider recommends, such as a moist heat pack or a heating pad. ? Place a  towel between your skin and the heat source. ? Leave the heat on for 20-30 minutes. ? Remove the heat if your skin turns bright red. This is especially important if you are unable to feel pain, heat, or cold. You may have a greater risk of getting burned.  Try taking hot showers or baths to help relax tight muscles. General instructions  If you are having frequent leg cramps, avoid intense exercise for several days.  Take over-the-counter and prescription medicines only as told by your health care provider.  Keep all follow-up visits as told by your health care provider. This is important. Contact a health care provider if:  Your leg cramps get more severe or more frequent, or they do not improve over time.  Your foot becomes cold, numb, or blue. Summary  Muscle cramps can develop in any muscle, but the most common place is in the calf muscles of the leg.  Leg cramps are painful, and they may last for a few seconds to a few minutes.  Usually, leg cramps are not caused by a serious medical problem. Often,  the cause is not known.  Stay hydrated and take over-the-counter and prescription medicines only as told by your health care provider. This information is not intended to replace advice given to you by your health care provider. Make sure you discuss any questions you have with your health care provider. Document Released: 01/10/2005 Document Revised: 09/12/2017 Document Reviewed: 09/12/2017 Elsevier Interactive Patient Education  2019 [employer]  Prolia/Denosumab injection What is this medicine? DENOSUMAB (den oh sue mab) slows bone breakdown. Prolia is used to treat osteoporosis in women after menopause and in men, and in people who are taking corticosteroids for 6 months or more. Delton See is used to treat a high calcium level due to cancer and to prevent bone fractures and other bone problems caused by multiple myeloma or cancer bone metastases. Delton See is also used to treat giant  cell tumor of the bone. This medicine may be used for other purposes; ask your health care provider or pharmacist if you have questions. COMMON BRAND NAME(S): Prolia, XGEVA What should I tell my health care provider before I take this medicine? They need to know if you have any of these conditions: -dental disease -having surgery or tooth extraction -infection -kidney disease -low levels of calcium or Vitamin D in the blood -malnutrition -on hemodialysis -skin conditions or sensitivity -thyroid or parathyroid disease -an unusual reaction to denosumab, other medicines, foods, dyes, or preservatives -pregnant or trying to get pregnant -breast-feeding How should I use this medicine? This medicine is for injection under the skin. It is given by a health care professional in a hospital or clinic setting. A special MedGuide will be given to you before each treatment. Be sure to read this information carefully each time. For Prolia, talk to your pediatrician regarding the use of this medicine in children. Special care may be needed. For Delton See, talk to your pediatrician regarding the use of this medicine in children. While this drug may be prescribed for children as young as 13 years for selected conditions, precautions do apply. Overdosage: If you think you have taken too much of this medicine contact a poison control center or emergency room at once. NOTE: This medicine is only for you. Do not share this medicine with others. What if I miss a dose? It is important not to miss your dose. Call your doctor or health care professional if you are unable to keep an appointment. What may interact with this medicine? Do not take this medicine with any of the following medications: -other medicines containing denosumab This medicine may also interact with the following medications: -medicines that lower your chance of fighting infection -steroid medicines like prednisone or cortisone This list may  not describe all possible interactions. Give your health care provider a list of all the medicines, herbs, non-prescription drugs, or dietary supplements you use. Also tell them if you smoke, drink alcohol, or use illegal drugs. Some items may interact with your medicine. What should I watch for while using this medicine? Visit your doctor or health care professional for regular checks on your progress. Your doctor or health care professional may order blood tests and other tests to see how you are doing. Call your doctor or health care professional for advice if you get a fever, chills or sore throat, or other symptoms of a cold or flu. Do not treat yourself. This drug may decrease your body's ability to fight infection. Try to avoid being around people who are sick. You should make sure you get enough  calcium and vitamin D while you are taking this medicine, unless your doctor tells you not to. Discuss the foods you eat and the vitamins you take with your health care professional. See your dentist regularly. Brush and floss your teeth as directed. Before you have any dental work done, tell your dentist you are receiving this medicine. Do not become pregnant while taking this medicine or for 5 months after stopping it. Talk with your doctor or health care professional about your birth control options while taking this medicine. Women should inform their doctor if they wish to become pregnant or think they might be pregnant. There is a potential for serious side effects to an unborn child. Talk to your health care professional or pharmacist for more information. What side effects may I notice from receiving this medicine? Side effects that you should report to your doctor or health care professional as soon as possible: -allergic reactions like skin rash, itching or hives, swelling of the face, lips, or tongue -bone pain -breathing problems -dizziness -jaw pain, especially after dental work -redness,  blistering, peeling of the skin -signs and symptoms of infection like fever or chills; cough; sore throat; pain or trouble passing urine -signs of low calcium like fast heartbeat, muscle cramps or muscle pain; pain, tingling, numbness in the hands or feet; seizures -unusual bleeding or bruising -unusually weak or tired Side effects that usually do not require medical attention (report to your doctor or health care professional if they continue or are bothersome): -constipation -diarrhea -headache -joint pain -loss of appetite -muscle pain -runny nose -tiredness -upset stomach This list may not describe all possible side effects. Call your doctor for medical advice about side effects. You may report side effects to FDA at 1-800-FDA-1088. Where should I keep my medicine? This medicine is only given in a clinic, doctor's office, or other health care setting and will not be stored at home. NOTE: This sheet is a summary. It may not cover all possible information. If you have questions about this medicine, talk to your doctor, pharmacist, or health care provider.  2019 Elsevier/Gold Standard (2018-04-11 16:10:44)  Osteoporosis  Osteoporosis is thinning and loss of density in your bones. Osteoporosis makes bones more brittle and fragile and more likely to break (fracture). Over time, osteoporosis can cause your bones to become so weak that they fracture after a minor fall. Bones in the hip, wrist, and spine are most likely to fracture due to osteoporosis. What are the causes? The exact cause of this condition is not known. What increases the risk? You may be at greater risk for osteoporosis if you:  Have a family history of the condition.  Have poor nutrition.  Use steroid medicines, such as prednisone.  Are male.  Are age 29 or older.  Smoke or have a history of smoking.  Are not physically active (are sedentary).  Are white (Caucasian) or of Asian descent.  Have a small  body frame.  Take certain medicines, such as antiseizure medicines. What are the signs or symptoms? A fracture might be the first sign of osteoporosis, especially if the fracture results from a fall or injury that usually would not cause a bone to break. Other signs and symptoms include:  Pain in the neck or low back.  Stooped posture.  Loss of height. How is this diagnosed? This condition may be diagnosed based on:  Your medical history.  A physical exam.  A bone mineral density test, also called a DXA or  DEXA test (dual-energy X-ray absorptiometry test). This test uses X-rays to measure the amount of minerals in your bones. How is this treated? The goal of treatment is to strengthen your bones and lower your risk for a fracture. Treatment may involve:  Making lifestyle changes, such as: ? Including foods with more calcium and vitamin D in your diet. ? Doing weight-bearing and muscle-strengthening exercises. ? Stopping tobacco use. ? Limiting alcohol intake.  Taking medicine to slow the process of bone loss or to increase bone density.  Taking daily supplements of calcium and vitamin D.  Taking hormone replacement medicines, such as estrogen for women and testosterone for men.  Monitoring your levels of calcium and vitamin D. Follow these instructions at home:  Activity  Exercise as told by your health care provider. Ask your health care provider what exercises and activities are safe for you. You should do: ? Exercises that make you work against gravity (weight-bearing exercises), such as tai chi, yoga, or walking. ? Exercises to strengthen muscles, such as lifting weights. Lifestyle  Limit alcohol intake to no more than 1 drink a day for nonpregnant women and 2 drinks a day for men. One drink equals 12 oz of beer, 5 oz of wine, or 1 oz of hard liquor.  Do not use any products that contain nicotine or tobacco, such as cigarettes and e-cigarettes. If you need help  quitting, ask your health care provider. Preventing falls  Use devices to help you move around (mobility aids) as needed, such as canes, walkers, scooters, or crutches.  Keep rooms well-lit and clutter-free.  Remove tripping hazards from walkways, including cords and throw rugs.  Install grab bars in bathrooms and safety rails on stairs.  Use rubber mats in the bathroom and other areas that are often wet or slippery.  Wear closed-toe shoes that fit well and support your feet. Wear shoes that have rubber soles or low heels.  Review your medicines with your health care provider. Some medicines can cause dizziness or changes in blood pressure, which can increase your risk of falling. General instructions  Include calcium and vitamin D in your diet. Calcium is important for bone health, and vitamin D helps your body to absorb calcium. Good sources of calcium and vitamin D include: ? Certain fatty fish, such as salmon and tuna. ? Products that have calcium and vitamin D added to them (fortified products), such as fortified cereals. ? Egg yolks. ? Cheese. ? Liver.  Take over-the-counter and prescription medicines only as told by your health care provider.  Keep all follow-up visits as told by your health care provider. This is important. Contact a health care provider if:  You have never been screened for osteoporosis and you are: ? A woman who is age 8 or older. ? A man who is age 63 or older. Get help right away if:  You fall or injure yourself. Summary  Osteoporosis is thinning and loss of density in your bones. This makes bones more brittle and fragile and more likely to break (fracture),even with minor falls.  The goal of treatment is to strengthen your bones and reduce your risk for a fracture.  Include calcium and vitamin D in your diet. Calcium is important for bone health, and vitamin D helps your body to absorb calcium.  Talk with your health care provider about  screening for osteoporosis if you are a woman who is age 38 or older, or a man who is age 108 or older.  This information is not intended to replace advice given to you by your health care provider. Make sure you discuss any questions you have with your health care provider. Document Released: 09/12/2005 Document Revised: 09/27/2017 Document Reviewed: 09/27/2017 Elsevier Interactive Patient Education  2019 [employer].

## 2018-12-18 NOTE — Progress Notes (Signed)
Chief Complaint  Patient presents with  . Follow-up   F/u with wife  1. H/o HTN off lisinoprol 10 mg qd. HLD was on Crestor does not want to be on statin ASCVD risk score rec statin not aspirin discussed with pt today  2. Osteoporosis stopped fosamax weekly as it if hard to remember. He is taking Bone up 3 capsultes 2x per day vitamin D3 1000 IU daily and calcium 1000 mg daily in 6 capsules  3. Hematuria and penile ulcer w/u with Dr. Elenor Quinones negative bx of penile ulcer which is healed and negative CT renal for hematuria w/u  4. Right groin c/o skin redness and irritation using clotrimazole,betamathasone x 1 day. He thinks related to wearing pad and wetness in the area with h/o urine dribbling  5. C/o leg cramps right thigh last was months ago but felt deep. He reports he is not drinking a lot of water 6. Zenker diverticulum saw [facility] 11/28/18 Dr. Ihor Austin with another new or old zenker and he reports dysphagia with chicken breast or dry meats and steak and at times painful to swallow meals, specialist will monitor fu prn   Review of Systems  Constitutional: Negative for malaise/fatigue and weight loss.  HENT: Negative for hearing loss.        Wears aids   Eyes: Negative for blurred vision.  Respiratory: Negative for shortness of breath.   Cardiovascular: Negative for chest pain.  Gastrointestinal: Negative for abdominal pain.       Dysphagia for meats at times   Musculoskeletal: Negative for falls.  Skin: Negative for rash.  Neurological: Negative for dizziness.  Psychiatric/Behavioral: Negative for depression and memory loss.   Past Medical History:  Diagnosis Date  . Arthritis   . Chronic hoarseness   . Complication of anesthesia    h/o nasal intubation x 1 hoarseness x 1 week  . Contusion of chest   . 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 Gastritis   . GERD (gastroesophageal reflux disease)   . Hemorrhagic stroke ([place])    more than 5 years ago  . History of chicken pox   .  Hyperlipidemia    did not like crestor   . Hypertension   . Hypoglycemia   . Hypogonadism in male   . Insomnia   . Low testosterone in male   . Osteoporosis   . Osteoporosis   . Prediabetes   . Prediabetes    A1C 5.9 06/25/18   . Rotator cuff disorder   . Sleep apnea    cpap  . Stroke ([place])   . Vocal cord dysfunction    in 2018 damage per pt   . Vocal cord nodule   . Zenker diverticulum    1.7 cm   . Zenker diverticulum    Past Surgical History:  Procedure Laterality Date  . APPENDECTOMY     1955  . EYE SURGERY     b/l cataract  . FEMUR FRACTURE SURGERY    . FRACTURE SURGERY    . LARYNGOSCOPY  09/17/2017   Procedure: LARYNGOSCOPY;  Surgeon: Beverly Gust, MD;  Location: [facility] ORS;  Service: ENT;;  . PENILE BIOPSY     03/980 ulcer with lichenoid inflammation   . rcr Bilateral   . SHOULDER SURGERY     x3 right and left   . THROAT SURGERY     vocal cord nodule  . TONSILLECTOMY    . TONSILLECTOMY     1948  Family History  Problem Relation Age of Onset  . Hearing loss Father   . Other Father        brain tumor  . Parkinson's disease Father   . Arthritis Father   . Cancer Mother        pancreatic   . Diabetes Sister   . Arthritis Son   . Stroke Maternal Grandfather   . Stroke Paternal Grandmother   . Stroke Paternal Grandfather   . Diabetes Sister   . Hyperlipidemia Sister   . Hypertension Sister   . Asthma Daughter   . Hypertension Daughter   . Asthma Son   . Prostate cancer Neg Hx   . Bladder Cancer Neg Hx   . Kidney cancer Neg Hx    Social History   Socioeconomic History  . Marital status: Married    Spouse name: Not on file  . Number of children: Not on file  . Years of education: Not on file  . Highest education level: Not on file  Occupational History  . Not on file  Social Needs  . Financial resource strain: Not on file  . Food insecurity:    Worry: Not on file    Inability: Not on file  . Transportation needs:    Medical: Not on  file    Non-medical: Not on file  Tobacco Use  . Smoking status: Never Smoker  . Smokeless tobacco: Never Used  Substance and Sexual Activity  . Alcohol use: No  . Drug use: No  . Sexual activity: Not Currently  Lifestyle  . Physical activity:    Days per week: Not on file    Minutes per session: Not on file  . Stress: Not on file  Relationships  . Social connections:    Talks on phone: Not on file    Gets together: Not on file    Attends religious service: Not on file    Active member of club or organization: Not on file    Attends meetings of clubs or organizations: Not on file    Relationship status: Not on file  . Intimate partner violence:    Fear of current or ex partner: Not on file    Emotionally abused: Not on file    Physically abused: Not on file    Forced sexual activity: Not on file  Other Topics Concern  . Not on file  Social History Narrative   Never smoker    Married    Engineer, drilling work    Surveyor, mining in relationship    12 grade ed    5 kids (3 daughters, 2 sons)   31 grand kids   From [place] lives between [place] and [place] for now    Current Meds  Medication Sig  . tamsulosin (FLOMAX) 0.4 MG CAPS capsule Take 1 capsule (0.4 mg total) by mouth 2 (two) times daily.   No Known Allergies No results found for this or any previous visit (from the past 2160 hour(s)). Objective  There is no height or weight on file to calculate BMI. Wt Readings from Last 3 Encounters:  09/16/18 181 lb 12.8 oz (82.5 kg)  04/23/18 183 lb 12.8 oz (83.4 kg)  04/16/18 184 lb (83.5 kg)   Temp Readings from Last 3 Encounters:  09/16/18 98.9 F (37.2 C) (Oral)  11/21/17 98 F (36.7 C) (Oral)  09/17/17 (!) 96.8 F (36 C)   BP Readings from Last 3 Encounters:  09/16/18 (!) 106/10  04/23/18 107/64  04/16/18 99/62   Pulse Readings from Last 3 Encounters:  09/16/18 (!) 57  04/23/18 62  04/16/18 64    Physical Exam Vitals signs and  nursing note reviewed.  Constitutional:      Appearance: Normal appearance.  HENT:     Head: Normocephalic and atraumatic.     Mouth/Throat:     Mouth: Mucous membranes are moist.     Pharynx: Oropharynx is clear.  Eyes:     Conjunctiva/sclera: Conjunctivae normal.     Pupils: Pupils are equal, round, and reactive to light.  Cardiovascular:     Rate and Rhythm: Regular rhythm. Bradycardia present.     Heart sounds: Normal heart sounds. No murmur.  Pulmonary:     Effort: Pulmonary effort is normal.     Breath sounds: Normal breath sounds.  Skin:    General: Skin is warm and dry.       Neurological:     General: No focal deficit present.     Mental Status: He is alert and oriented to person, place, and time.     Gait: Gait normal.  Psychiatric:        Attention and Perception: Attention and perception normal.        Mood and Affect: Mood and affect normal.        Speech: Speech normal.        Behavior: Behavior normal. Behavior is cooperative.        Thought Content: Thought content normal.        Cognition and Memory: Cognition and memory normal.        Judgment: Judgment normal.     Assessment   1. HTN now BP controlled off medications lisinopril 10 mg qd  2. Osteoporosis  3. Hematuria w/u negative and penile ulcer bx neg except inflammation 04/2018 Dr. Bernardo Heater  4. Right groin intertigo vs tinea  5. Leg cramps  6. Zenker diverticulum  7. HM 8. Bradycardia w/o sx's, HLD Plan   1. Monitor BP  2. Try to get prolia approved stop fosamax for now  3. F/u urology prn  4. Clotrimazole alone with otc hc 1% not betamathasone  Consider zeasorb AF powder  5. Increase water intake  sch labs mag and K  Disc theraworx otc  6. F/u [facility] prn monitor for dysphagia  7.  Declines flu shot  Tdap had 08/17/13  prevnar 11/05/14  pna 23 09/30/15 Consider shingrix in future   Never smoker  PSA 0.69 11/20/17, PSA 06/25/18 0.495 Colonoscopy 04/11/17 diverticulosis f/u 10 years Dr. Ivar Bury [place]  EGD 04/11/17 intrisic stenosis lower esophagus moderate to severe <1 cm TTS dilator 15-16 5-18 mm rec EGD in 1 year for +barretts survellence nexium 24 hr otc qd before breakfast   Referred derm [facility] check on referral again  DEXA per pt osteoporosis -2.9 spine get records on fosamax x 3 months with other PCP has 2 more weeks  -DEXA 06/26/18 T score L1/2 -2.9, -3.0, L3 -3.4, L4 -2.4 total -2.9  -try to get prolia approved hard remember to take Fosamax  Former PCP Dr. Renelda Loma requested records  -reviewed records saw 06/25/18 -h/o low testosterone, h/o hematuria Labs 06/25/18 CMET, CBC, lipid abnormal, TSH, PSA: A1C 5.9 prediabetes,  -TSH 1.6277 06/25/18, WBC 5.23, H/H 13.1/40.1, plts 163, glucose 139/A1C 5.9 prediabetes, cmet K4.2 BUN 19, Cr 0.89 GFR 83 , CPK 93, TC 237, TG 250, LDL 135, HDL 52, PSA 0.495  -  will check UA in future H/o balanitis   8. Refer cards consider further w/u. Pt declines to be on statin  Eye Dr. Almetta Lovely [place]  Dentist Veverly Fells [place] PA  Urology Dr. Bernardo Heater  Provider: Dr. Olivia Mackie McLean-Scocuzza-Internal Medicine

## 2018-12-18 NOTE — Telephone Encounter (Signed)
Pt coming in for labs tomorrow, please place future orders. Thank you.  

## 2018-12-19 ENCOUNTER — Other Ambulatory Visit (INDEPENDENT_AMBULATORY_CARE_PROVIDER_SITE_OTHER): Payer: Medicare Other

## 2018-12-19 DIAGNOSIS — R001 Bradycardia, unspecified: Secondary | ICD-10-CM | POA: Diagnosis not present

## 2018-12-19 DIAGNOSIS — Z1159 Encounter for screening for other viral diseases: Secondary | ICD-10-CM | POA: Diagnosis not present

## 2018-12-19 DIAGNOSIS — I10 Essential (primary) hypertension: Secondary | ICD-10-CM

## 2018-12-19 DIAGNOSIS — Z1329 Encounter for screening for other suspected endocrine disorder: Secondary | ICD-10-CM | POA: Diagnosis not present

## 2018-12-19 DIAGNOSIS — R790 Abnormal level of blood mineral: Secondary | ICD-10-CM

## 2018-12-19 DIAGNOSIS — E782 Mixed hyperlipidemia: Secondary | ICD-10-CM

## 2018-12-19 DIAGNOSIS — E291 Testicular hypofunction: Secondary | ICD-10-CM | POA: Diagnosis not present

## 2018-12-19 DIAGNOSIS — E559 Vitamin D deficiency, unspecified: Secondary | ICD-10-CM | POA: Diagnosis not present

## 2018-12-19 DIAGNOSIS — R252 Cramp and spasm: Secondary | ICD-10-CM | POA: Diagnosis not present

## 2018-12-19 LAB — LIPID PANEL
Cholesterol: 232 mg/dL — ABNORMAL HIGH (ref 0–200)
HDL: 54.7 mg/dL (ref >39.00)
LDL Cholesterol: 154 mg/dL — ABNORMAL HIGH (ref 0–99)
NonHDL: 177.26
Total CHOL/HDL Ratio: 4
Triglycerides: 118 mg/dL (ref 0.0–149.0)
VLDL: 23.6 mg/dL (ref 0.0–40.0)

## 2018-12-19 LAB — COMPREHENSIVE METABOLIC PANEL
ALT: 17 U/L (ref 0–53)
AST: 18 U/L (ref 0–37)
Albumin: 4.2 g/dL (ref 3.5–5.2)
Alkaline Phosphatase: 42 U/L (ref 39–117)
BILIRUBIN TOTAL: 0.5 mg/dL (ref 0.2–1.2)
BUN: 20 mg/dL (ref 6–23)
CO2: 29 mEq/L (ref 19–32)
Calcium: 9.5 mg/dL (ref 8.4–10.5)
Chloride: 103 mEq/L (ref 96–112)
Creatinine, Ser: 0.83 mg/dL (ref 0.40–1.50)
GFR: 95.21 mL/min (ref >60.00)
Glucose, Bld: 105 mg/dL — ABNORMAL HIGH (ref 70–99)
Potassium: 4.3 mEq/L (ref 3.5–5.1)
Sodium: 140 mEq/L (ref 135–145)
Total Protein: 6.8 g/dL (ref 6.0–8.3)

## 2018-12-19 LAB — CBC WITH DIFFERENTIAL/PLATELET
BASOS ABS: 0 10*3/uL (ref 0.0–0.1)
Basophils Relative: 0.8 % (ref 0.0–3.0)
Eosinophils Absolute: 0.5 10*3/uL (ref 0.0–0.7)
Eosinophils Relative: 7.8 % — ABNORMAL HIGH (ref 0.0–5.0)
HEMATOCRIT: 44.4 % (ref 39.0–52.0)
Hemoglobin: 14.8 g/dL (ref 13.0–17.0)
LYMPHS PCT: 20.1 % (ref 12.0–46.0)
Lymphs Abs: 1.2 10*3/uL (ref 0.7–4.0)
MCHC: 33.4 g/dL (ref 30.0–36.0)
MCV: 95.8 fl (ref 78.0–100.0)
Monocytes Absolute: 0.7 10*3/uL (ref 0.1–1.0)
Monocytes Relative: 12.3 % — ABNORMAL HIGH (ref 3.0–12.0)
Neutro Abs: 3.4 10*3/uL (ref 1.4–7.7)
Neutrophils Relative %: 59 % (ref 43.0–77.0)
Platelets: 211 10*3/uL (ref 150.0–400.0)
RBC: 4.63 Mil/uL (ref 4.22–5.81)
RDW: 13.4 % (ref 11.5–15.5)
WBC: 5.8 10*3/uL (ref 4.0–10.5)

## 2018-12-19 LAB — TSH: TSH: 3.03 u[IU]/mL (ref 0.35–4.50)

## 2018-12-19 LAB — MAGNESIUM: MAGNESIUM: 2 mg/dL (ref 1.5–2.5)

## 2018-12-19 LAB — VITAMIN D 25 HYDROXY (VIT D DEFICIENCY, FRACTURES): VITD: 54.74 ng/mL (ref 30.00–100.00)

## 2018-12-19 LAB — TESTOSTERONE: Testosterone: 361.17 ng/dL (ref 300.00–890.00)

## 2018-12-22 LAB — MEASLES/MUMPS/RUBELLA IMMUNITY
Mumps IgG: 13.3 AU/mL
Rubella: 2.05 index
Rubeola IgG: >300 AU/mL

## 2018-12-22 NOTE — Progress Notes (Signed)
[facility] tried to contact him multiple times to schedule. I will resubmit the referral to them since they declined it due to no rtc.

## 2019-02-23 ENCOUNTER — Telehealth: Payer: Self-pay

## 2019-02-23 DIAGNOSIS — L821 Other seborrheic keratosis: Secondary | ICD-10-CM | POA: Diagnosis not present

## 2019-02-23 DIAGNOSIS — D2262 Melanocytic nevi of left upper limb, including shoulder: Secondary | ICD-10-CM | POA: Diagnosis not present

## 2019-02-23 DIAGNOSIS — D2261 Melanocytic nevi of right upper limb, including shoulder: Secondary | ICD-10-CM | POA: Diagnosis not present

## 2019-02-23 DIAGNOSIS — D2271 Melanocytic nevi of right lower limb, including hip: Secondary | ICD-10-CM | POA: Diagnosis not present

## 2019-02-23 DIAGNOSIS — L309 Dermatitis, unspecified: Secondary | ICD-10-CM | POA: Diagnosis not present

## 2019-02-23 DIAGNOSIS — D2272 Melanocytic nevi of left lower limb, including hip: Secondary | ICD-10-CM | POA: Diagnosis not present

## 2019-02-23 NOTE — Telephone Encounter (Signed)
Faxed path report as requested.  Called office and spoke with Hassan Rowan at office who confirmed receipt of report.

## 2019-02-23 NOTE — Telephone Encounter (Signed)
Copied from [place] [phone]. Topic: General - Inquiry >> Feb 23, 2019  9:43 AM Berneta Levins wrote: Reason for CRM:   Larena Glassman from [facility] called.  States they need recent biopsy done of penis - need pathology.   Fax number is [phone] Larena Glassman can be reached at [phone]

## 2019-02-24 ENCOUNTER — Ambulatory Visit: Payer: PRIVATE HEALTH INSURANCE | Admitting: Cardiovascular Disease

## 2019-03-02 ENCOUNTER — Encounter: Payer: Self-pay | Admitting: Cardiovascular Disease

## 2019-03-02 ENCOUNTER — Other Ambulatory Visit: Payer: Self-pay

## 2019-03-02 ENCOUNTER — Ambulatory Visit (INDEPENDENT_AMBULATORY_CARE_PROVIDER_SITE_OTHER): Payer: Medicare Other | Admitting: Cardiovascular Disease

## 2019-03-02 VITALS — BP 110/64 | HR 76 | Ht 70.0 in | Wt 178.0 lb

## 2019-03-02 DIAGNOSIS — R001 Bradycardia, unspecified: Secondary | ICD-10-CM | POA: Diagnosis not present

## 2019-03-02 DIAGNOSIS — I10 Essential (primary) hypertension: Secondary | ICD-10-CM

## 2019-03-02 DIAGNOSIS — I700 Atherosclerosis of aorta: Secondary | ICD-10-CM | POA: Insufficient documentation

## 2019-03-02 DIAGNOSIS — E782 Mixed hyperlipidemia: Secondary | ICD-10-CM

## 2019-03-02 NOTE — Progress Notes (Signed)
Cardiology Office Note  Date:  03/02/2019   ID:  Shawn Khan, DOB: Jul 08, 1941, MRN: [id number]  PCP:  McLean-Scocuzza, Nino Glow, MD   Chief Complaint  Patient presents with  . Other    Per Dr. Terese Door Bradycardia and Hyperlipidema. Meds reviewed verbally with patient.     HPI:  Shawn Khan is a 78 y.o. male with a history of: Benign essential hypertension Mixed hyperlipidemia Bradycardia Dysphagia GERD Prediabetes Stroke w/o residual deficits Hypoxemia Insomnia Obstructive sleep apnea syndrome Who presents to the office today referred by Dr. Terese Door for bradycardia and hyperlipidemia.  INTERVAL HISTORY: The patient reports today for an initial visit.  He is doing well today.  EKG from 2018 showed he had a heart rate of 54.  Heart rate on recent evaluation with primary care was 52 heart rate October 2019 was 57 His heart rate today is high due to him racing to get to the office.   His blood pressure usually runs low and is taking Flomax twice a day. He gets dizzy when he tries to stand up quickly. Has a granddaughter that also has dizziness due to low blood pressure.  He drinks apple cider vinegar in the morning daily to help with weight loss. He does not drink a lot of fluids.   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 He had an episode in the summer where his blood pressure dropped to 70/40, was dehydrated  He's had a hemorrhagic stroke about 10 years ago. States he has had some memory issues since then. Afterwards his PCP put him on amlodipine and lisinopril, but has stopped taking both as blood pressure has been fine  His CT scan from March 2019 pulled up in the office today and discussed with him  Mild calcification of the aortic valve  mild plaque build-up in the distal aorta.   Never smoker.  Previously on Crestor in May 2019, but took himself off  was on Lipitor for a while, but no longer uses it. Does  not recall the exact reaction to the statins, but remembers being tired.   Today's Blood pressure 110/64 Total Chol 232/ LDL 154 HBA1C 5.8 CR 0.83 Glucose 105  EKG personally reviewed by myself on todays visit Shows normal sinus rhythm. 76 bpm. Normal ECG   OTHER PAST MEDICAL HISTORY REVIEWED BY ME FOR TODAY'S VISIT:   PMH:   has a past medical history of Arthritis, Chronic hoarseness, Complication of anesthesia, Contusion of chest, Cough, Gastritis, GERD (gastroesophageal reflux disease), Hemorrhagic stroke ([place]), History of chicken pox, Hyperlipidemia, Hypertension, Hypoglycemia, Hypogonadism in male, Insomnia, Low testosterone in male, Osteoporosis, Osteoporosis, Prediabetes, Prediabetes, Rotator cuff disorder, Sleep apnea, Stroke ([place]), Vocal cord dysfunction, Vocal cord nodule, Zenker diverticulum, and Zenker diverticulum.  PSH:    Past Surgical History:  Procedure Laterality Date  . APPENDECTOMY     1955  . EYE SURGERY     b/l cataract  . FEMUR FRACTURE SURGERY    . FRACTURE SURGERY    . LARYNGOSCOPY  09/17/2017   Procedure: LARYNGOSCOPY;  Surgeon: Beverly Gust, MD;  Location: [facility] ORS;  Service: ENT;;  . PENILE BIOPSY     04/9562 ulcer with lichenoid inflammation   . rcr Bilateral   . SHOULDER SURGERY     x3 right and left   . THROAT SURGERY     vocal cord nodule  . TONSILLECTOMY    . TONSILLECTOMY     1948  Current Outpatient Medications  Medication Sig Dispense Refill  . clobetasol ointment (TEMOVATE) 0.05 % APPLY DAILY TO AFFECTED AREA UP TO 2 WEEKS.    . clotrimazole (LOTRIMIN) 1 % cream Apply 1 application topically 2 (two) times daily. 60 g 11  . NON FORMULARY Bone up 3 capsules 2x per day    . tamsulosin (FLOMAX) 0.4 MG CAPS capsule Take 1 capsule (0.4 mg total) by mouth 2 (two) times daily. 180 capsule 3   No current facility-administered medications for this visit.      ALLERGIES:   Patient has no known allergies.   SOCIAL HISTORY:  The patient   reports that he has never smoked. He has never used smokeless tobacco. He reports that he does not drink alcohol or use drugs.   FAMILY HISTORY:   family history includes Arthritis in his father and son; Asthma in his daughter and son; Cancer in his mother; Diabetes in his sister and sister; Hearing loss in his father; Hyperlipidemia in his sister; Hypertension in his daughter and sister; Other in his father; Parkinson's disease in his father; Stroke in his maternal grandfather, paternal grandfather, and paternal grandmother.    REVIEW OF SYSTEMS: Review of Systems  Constitutional: Negative.   Eyes: Negative.   Respiratory: Negative.   Cardiovascular: Negative.   Gastrointestinal: Negative.   Genitourinary: Negative.   Musculoskeletal: Negative.   Neurological: Positive for dizziness (Orthostatic).  Psychiatric/Behavioral: Negative.   All other systems reviewed and are negative.    PHYSICAL EXAM: VS:  BP 110/64 (BP Location: Right Arm, Patient Position: Sitting, Cuff Size: Normal)   Pulse 76   Ht 5\' 10"  (1.778 m)   Wt 178 lb (80.7 kg)   BMI 25.54 kg/m  , BMI Body mass index is 25.54 kg/m.  GEN: Well nourished, well developed, in no acute distress HEENT: normal Neck: no JVD, carotid bruits, or masses Cardiac: RRR; no murmurs, rubs, or gallops,no edema Respiratory:  clear to auscultation bilaterally, normal work of breathing GI: soft, nontender, nondistended, + BS MS: no deformity or atrophy Skin: warm and dry, no rash Neuro:  Strength and sensation are intact Psych: euthymic mood, full affect    RECENT LABS: 12/19/2018: ALT 17; BUN 20; Creatinine, Ser 0.83; Hemoglobin 14.8; Magnesium 2.0; Platelets 211.0; Potassium 4.3; Sodium 140; TSH 3.03    LIPID PANEL: Lab Results  Component Value Date   CHOL 232 (H) 12/19/2018   HDL 54.70 12/19/2018   LDLCALC 154 (H) 12/19/2018   TRIG 118.0 12/19/2018      WEIGHT: Wt Readings from Last 3 Encounters:  03/02/19 178 lb (80.7  kg)  12/18/18 181 lb 3.2 oz (82.2 kg)  09/16/18 181 lb 12.8 oz (82.5 kg)     ASSESSMENT AND PLAN:  Aortic atherosclerosis (HCC) CT scan images pulled up and discussed with him in detail, mild to moderate descending aorta atherosclerosis Further screening not required Long discussion concerning his cholesterol  Benign essential hypertension - Plan: EKG 12-Lead Low blood pressure on today's visit and recently likely exacerbated by Flomax Mildly symptomatic but takes this time getting up to a standing position If symptoms get worse for example in the summer when he is dehydrated he may need to cut back on the Flomax Encouraged him to stay hydrated  Mixed hyperlipidemia Discussed various treatment options for his cholesterol including statins which he prefers not to take Zetia and even other new medications coming out, bempidoic acid He prefers no medications at this time but will think about it  Bradycardia Asymptomatic, heart rates in the low 50s up to 70s with exertion even in the office today No indication for pacemaker  Disposition:   F/U as needed  Total encounter time more than 45 minutes. Greater than 50% was spent in counseling and coordination of care with the patient.    Orders Placed This Encounter  Procedures  . EKG 12-Lead   I, Jesus Reyes am acting as a scribe for Ida Rogue, M.D., Ph.D.  I, Ida Rogue, M.D. Ph.D., have reviewed the above documentation for accuracy and completeness, and I agree with the above.   Signed, Esmond Plants, M.D., Ph.D. 03/02/2019  [place], [place]

## 2019-03-02 NOTE — Patient Instructions (Signed)
Medication Instructions:  No changes  Read about zetia 10 mg once a day for cholesterol Also read about the new one: Bempidoic acid  If you need a refill on your cardiac medications before your next appointment, please call your pharmacy.    Lab work: No new labs needed   If you have labs (blood work) drawn today and your tests are completely normal, you will receive your results only by: Marland Kitchen MyChart Message (if you have MyChart) OR . A paper copy in the mail If you have any lab test that is abnormal or we need to change your treatment, we will call you to review the results.   Testing/Procedures: No new testing needed   Follow-Up: At [facility], you and your health needs are our priority.  As part of our continuing mission to provide you with exceptional heart care, we have created designated Provider Care Teams.  These Care Teams include your primary Cardiologist (physician) and Advanced Practice Providers (APPs -  Physician Assistants and Nurse Practitioners) who all work together to provide you with the care you need, when you need it.  . You will need a follow up appointment as needed   . Providers on your designated Care Team:   . Murray Hodgkins, NP . Christell Faith, PA-C . Marrianne Mood, PA-C  Any Other Special Instructions Will Be Listed Below (If Applicable).  For educational health videos Log in to : [email or web address] Or : [email or web address], password : [email or web address]

## 2019-03-17 DIAGNOSIS — L309 Dermatitis, unspecified: Secondary | ICD-10-CM | POA: Diagnosis not present

## 2019-04-13 ENCOUNTER — Other Ambulatory Visit: Payer: Self-pay

## 2019-04-13 ENCOUNTER — Ambulatory Visit (INDEPENDENT_AMBULATORY_CARE_PROVIDER_SITE_OTHER): Payer: Medicare Other | Admitting: Family Medicine

## 2019-04-13 ENCOUNTER — Encounter: Payer: Self-pay | Admitting: Family Medicine

## 2019-04-13 DIAGNOSIS — M19041 Primary osteoarthritis, right hand: Secondary | ICD-10-CM | POA: Diagnosis not present

## 2019-04-13 MED ORDER — MELOXICAM 7.5 MG PO TABS: ORAL_TABLET | ORAL | 0 refills | Status: DC

## 2019-04-13 NOTE — Progress Notes (Signed)
Patient ID: Shawn Khan, male   DOB: 10-Mar-1941, 78 y.o.   MRN: [id number]  Virtual Visit via video & phone Note  This visit type was conducted due to national recommendations for restrictions regarding the COVID-19 pandemic (e.g. social distancing).  This format is felt to be most appropriate for this patient at this time.  All issues noted in this document were discussed and addressed.  No physical exam was performed (except for noted visual exam findings with Video Visits).   I connected with Shawn Khan on 04/13/19 at  8:00 AM EDT by a video enabled telemedicine application or telephone and verified that I am speaking with the correct person using two identifiers. Location patient: home Location provider: [facility] Persons participating in the virtual visit: patient, provider  I discussed the limitations, risks, security and privacy concerns of performing an evaluation and management service by video/telephone and the availability of in person appointments. I also discussed with the patient that there may be a patient responsible charge related to this service. The patient expressed understanding and agreed to proceed.  Interactive audio and video telecommunications were attempted between this provider and patient, however failed, due to patient having technical difficulties -- had video connection at first, but the video feed continue to freeze after multiple attempts to repeat system.  We chose to continue visit and finish conversation via telephone.   HPI:  Patient and I connected today due to right index finger pain.  States it first began hurting and being somewhat small about 3 weeks ago.  States he woke up one morning and it was stiff and swollen, had difficulty moving.  States he first finger to move by bending it down and has been working that finger every day to help avoid any more stiffness.  Currently has not tried any over-the-counter remedies to help reduce  pain.  Patient denies any injury to the finger.  States finger is not red or hot to touch.  Denies any drainage from the skin of the finger.  No fever or chills.  No body aches.  Denies cough, shortness of breath or wheezing.  Denies GI or GU issues.   ROS: See pertinent positives and negatives per HPI.  Past Medical History:  Diagnosis Date  . Arthritis   . Chronic hoarseness   . Complication of anesthesia    h/o nasal intubation x 1 hoarseness x 1 week  . Contusion of chest   . 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 Gastritis   . GERD (gastroesophageal reflux disease)   . Hemorrhagic stroke ([place])    more than 5 years ago  . History of chicken pox   . Hyperlipidemia    did not like crestor   . Hypertension   . Hypoglycemia   . Hypogonadism in male   . Insomnia   . Low testosterone in male   . Osteoporosis   . Osteoporosis   . Prediabetes   . Prediabetes    A1C 5.9 06/25/18   . Rotator cuff disorder   . Sleep apnea    cpap  . Stroke ([place])   . Vocal cord dysfunction    in 2018 damage per pt   . Vocal cord nodule   . Zenker diverticulum    1.7 cm   . Zenker diverticulum     Past Surgical History:  Procedure Laterality Date  . APPENDECTOMY     1955  . EYE SURGERY     b/l  cataract  . FEMUR FRACTURE SURGERY    . FRACTURE SURGERY    . LARYNGOSCOPY  09/17/2017   Procedure: LARYNGOSCOPY;  Surgeon: Beverly Gust, MD;  Location: [facility] ORS;  Service: ENT;;  . PENILE BIOPSY     12/6107 ulcer with lichenoid inflammation   . rcr Bilateral   . SHOULDER SURGERY     x3 right and left   . THROAT SURGERY     vocal cord nodule  . TONSILLECTOMY    . TONSILLECTOMY     1948    Family History  Problem Relation Age of Onset  . Hearing loss Father   . Other Father        brain tumor  . Parkinson's disease Father   . Arthritis Father   . Cancer Mother        pancreatic   . Diabetes Sister   . Arthritis Son   . Stroke Maternal Grandfather   . Stroke Paternal Grandmother    . Stroke Paternal Grandfather   . Diabetes Sister   . Hyperlipidemia Sister   . Hypertension Sister   . Asthma Daughter   . Hypertension Daughter   . Asthma Son   . Prostate cancer Neg Hx   . Bladder Cancer Neg Hx   . Kidney cancer Neg Hx    Social History   Tobacco Use  . Smoking status: Never Smoker  . Smokeless tobacco: Never Used  Substance Use Topics  . Alcohol use: No    Current Outpatient Medications:  .  clobetasol ointment (TEMOVATE) 0.05 %, APPLY DAILY TO AFFECTED AREA UP TO 2 WEEKS., Disp: , Rfl:  .  clotrimazole (LOTRIMIN) 1 % cream, Apply 1 application topically 2 (two) times daily., Disp: 60 g, Rfl: 11 .  NON FORMULARY, Bone up 3 capsules 2x per day, Disp: , Rfl:  .  tamsulosin (FLOMAX) 0.4 MG CAPS capsule, Take 1 capsule (0.4 mg total) by mouth 2 (two) times daily., Disp: 180 capsule, Rfl: 3 .  meloxicam (MOBIC) 7.5 MG tablet, Take 1 tablet daily for 14 days. After 14 days, use daily as needed. Take with food., Disp: 30 tablet, Rfl: 0  EXAM:  GENERAL: alert, oriented, sounds well and in no acute distress  LUNGS: speaking in full sentences, no signs of respiratory distress, breathing rate appears normal, no gasping or wheezing  PSYCH/NEURO: pleasant and cooperative, no obvious depression or anxiety, speech and thought processing grossly intact  ASSESSMENT AND PLAN:  Discussed the following assessment and plan:  Arthritis of finger of right hand - Plan: meloxicam (MOBIC) 7.5 MG tablet  Our video feed froze before I was able to review patient's finger over the video, his description of the pain and stiffness and some swelling do seem consistent with a flareup of arthritis in the index finger of right hand.  We will do meloxicam 7.5 mg once daily for 2 weeks, then go to as needed dosing of this medicine.  Patient's most recent kidney functions from January were reviewed and do not show any signs of kidney disease or failure, so I feel low-dose short course of  Mobic is appropriate.  Also advised he can try topical rub such as BenGay or Biofreeze on the finger to see if this helps reduce pain and swelling.  Advised that if pain and swelling continue to persist, he needs to let us know right away and we will move forward with either imaging and/or in person visit to assess finger.   I discussed the  assessment and treatment plan with the patient. The patient was provided an opportunity to ask questions and all were answered. The patient agreed with the plan and demonstrated an understanding of the instructions.   The patient was advised to call back or seek an in-person evaluation if the symptoms worsen or if the condition fails to improve as anticipated.  I provided 15 minutes of non-face-to-face time during this encounter.  Jodelle Green, FNP

## 2019-05-01 DIAGNOSIS — Z961 Presence of intraocular lens: Secondary | ICD-10-CM | POA: Diagnosis not present

## 2019-05-19 ENCOUNTER — Other Ambulatory Visit: Payer: Self-pay

## 2019-05-19 ENCOUNTER — Ambulatory Visit (INDEPENDENT_AMBULATORY_CARE_PROVIDER_SITE_OTHER): Payer: Medicare Other | Admitting: Internal Medicine

## 2019-05-19 DIAGNOSIS — G4700 Insomnia, unspecified: Secondary | ICD-10-CM | POA: Diagnosis not present

## 2019-05-19 DIAGNOSIS — G4733 Obstructive sleep apnea (adult) (pediatric): Secondary | ICD-10-CM

## 2019-05-19 DIAGNOSIS — R1310 Dysphagia, unspecified: Secondary | ICD-10-CM

## 2019-05-19 DIAGNOSIS — M545 Low back pain: Secondary | ICD-10-CM | POA: Diagnosis not present

## 2019-05-19 DIAGNOSIS — K225 Diverticulum of esophagus, acquired: Secondary | ICD-10-CM | POA: Diagnosis not present

## 2019-05-19 DIAGNOSIS — R252 Cramp and spasm: Secondary | ICD-10-CM

## 2019-05-19 DIAGNOSIS — G8929 Other chronic pain: Secondary | ICD-10-CM

## 2019-05-19 NOTE — Progress Notes (Addendum)
Virtual Visit via Video Note  I connected with Shawn Khan  on 05/19/19 at  8:41 AM EDT by a video enabled telemedicine application and verified that I am speaking with the correct person using two identifiers.  Location patient: home in [place] Location provider:work  Persons participating in the virtual visit: patient, provider  I discussed the limitations of evaluation and management by telemedicine and the availability of in person appointments. The patient expressed understanding and agreed to proceed.   HPI: 1. Leg crams 2 am w/in the last 2 weeks and leg cramp was deep. He has had leg cramps 6-8 x over the last few months.  He reports leg cramps being so deep he couldn't stand to pull up and extended into his hips. The next am c/o leg cramps L>R leg  2. Chronic cough 2/2 zenker diverticulum he had 2 attempts of GI in [place] to repair but unsuccessful so went to [facility] and had difficult procedure referred to another doc then then had another procedure to repair zenker but it returned. He reports chronic cough 2/2 Zenker and w/in the last weeks he chocked on dry grits x 45 minutes and water to forcefully cough to resolved. At times he chokes on pills no current f/u with [facility] for now  3. BP and HR has not checked and not seen his PCP in [place] but has seen his eye MD there  4. Insomnia c/o sleeping 4-6 hrs melatonin does not help does not want meds Rx for sleep   ROS: See pertinent positives and negatives per HPI. General: fatigue from working on house repairs  GI: dysphagia +, chronic cough CV: denies CP Psych: sleeping 4-6 hrs at night no meds wanted for sleep MSK: chronic low back pain +    Past Medical History:  Diagnosis Date  . Arthritis   . Chronic hoarseness   . Complication of anesthesia    h/o nasal intubation x 1 hoarseness x 1 week  . Contusion of chest   . 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 Gastritis   . GERD (gastroesophageal reflux disease)   . Hemorrhagic  stroke ([place])    more than 5 years ago  . History of chicken pox   . Hyperlipidemia    did not like crestor   . Hypertension   . Hypoglycemia   . Hypogonadism in male   . Insomnia   . Low testosterone in male   . Osteoporosis   . Osteoporosis   . Prediabetes   . Prediabetes    A1C 5.9 06/25/18   . Rotator cuff disorder   . Sleep apnea    cpap  . Stroke ([place])   . Vocal cord dysfunction    in 2018 damage per pt   . Vocal cord nodule   . Zenker diverticulum    1.7 cm   . Zenker diverticulum     Past Surgical History:  Procedure Laterality Date  . APPENDECTOMY     1955  . EYE SURGERY     b/l cataract  . FEMUR FRACTURE SURGERY    . FRACTURE SURGERY    . LARYNGOSCOPY  09/17/2017   Procedure: LARYNGOSCOPY;  Surgeon: Beverly Gust, MD;  Location: [facility] ORS;  Service: ENT;;  . PENILE BIOPSY     12/6107 ulcer with lichenoid inflammation   . rcr Bilateral   . SHOULDER SURGERY     x3 right and left   . THROAT SURGERY  vocal cord nodule  . TONSILLECTOMY    . TONSILLECTOMY     1948    Family History  Problem Relation Age of Onset  . Hearing loss Father   . Other Father        brain tumor  . Parkinson's disease Father   . Arthritis Father   . Cancer Mother        pancreatic   . Diabetes Sister   . Arthritis Son   . Stroke Maternal Grandfather   . Stroke Paternal Grandmother   . Stroke Paternal Grandfather   . Diabetes Sister   . Hyperlipidemia Sister   . Hypertension Sister   . Asthma Daughter   . Hypertension Daughter   . Asthma Son   . Prostate cancer Neg Hx   . Bladder Cancer Neg Hx   . Kidney cancer Neg Hx     SOCIAL HX: married in [place] moving to [place] ultimately but back and forth   Current Outpatient Medications:  .  clobetasol ointment (TEMOVATE) 0.05 %, APPLY DAILY TO AFFECTED AREA UP TO 2 WEEKS., Disp: , Rfl:  .  clotrimazole (LOTRIMIN) 1 % cream, Apply 1 application topically 2 (two) times daily., Disp: 60 g, Rfl: 11 .  meloxicam (MOBIC) 7.5 MG  tablet, Take 1 tablet daily for 14 days. After 14 days, use daily as needed. Take with food., Disp: 30 tablet, Rfl: 0 .  NON FORMULARY, Bone up 3 capsules 2x per day, Disp: , Rfl:  .  tamsulosin (FLOMAX) 0.4 MG CAPS capsule, Take 1 capsule (0.4 mg total) by mouth 2 (two) times daily., Disp: 180 capsule, Rfl: 3  EXAM:  VITALS per patient if applicable:  GENERAL: alert, oriented, appears well and in no acute distress  HEENT: atraumatic, conjunttiva clear, no obvious abnormalities on inspection of external nose and ears  NECK: normal movements of the head and neck  LUNGS: on inspection no signs of respiratory distress, breathing rate appears normal, no obvious gross SOB, gasping or wheezing  CV: no obvious cyanosis  MS: moves all visible extremities without noticeable abnormality  PSYCH/NEURO: pleasant and cooperative, no obvious depression or anxiety, speech and thought processing grossly intact  ASSESSMENT AND PLAN:  Discussed the following assessment and plan:  Dysphagia, unspecified type with Zenker's diverticulum and h/o barretts -consider Dr. Manuella Ghazi at [facility] per Dr. Ihor Austin if sx's worsen in the future  -rec take small bites with sips of water   Leg cramps-disc increase water intake and otc therawox   Chronic low back pain -prn tylenol monitor for now   Insomnia -declines meds disc chamomile tea, meditation and diffusing with lavender for now and warm mild   OSA (obstructive sleep apnea) on bipap tolerating and benefiting   HM Declines flu shot  Tdap had 08/17/13  prevnar 11/05/14  pna 23 09/30/15 Consider shingrix in future  MMR immune  HLD-prev declined statin  Check A1C in future hyperglycemia  Never smoker  PSA 0.69 11/20/17, PSA 06/25/18 0.495 Colonoscopy4/26/18diverticulosis f/u 10 years Dr. Maeola Harman [place] EGD 04/11/17 intrisic stenosis lower esophagus moderate to severe <1 cm TTS dilator 15-16 5-18 mm rec EGD in 1 year for +barretts survellence  nexium 24 hr otc qd before breakfast  Referred derm previously [facility] check on referral in future ask pt  DEXA per pt osteoporosis -2.9 spine get records on fosamax x 3 months with other PCP has 2 more weeks -DEXA 06/26/18 T score L1/2 -2.9, -3.0, L3 -3.4, L4 -2.4 total -2.9 -  try to get prolia approved hard remember to take Fosamax -likely needs repeat DEXA in future  -declines prolia consider endocrine in future  Former PCP Dr. Renelda Loma requested records -reviewed records saw 06/25/18 -h/o low testosterone, h/o hematuria Labs 06/25/18 CMET, CBC, lipid abnormal, TSH, PSA: A1C 5.9 prediabetes,  -TSH 1.6277 06/25/18, WBC 5.23, H/H 13.1/40.1, plts 163, glucose 139/A1C 5.9 prediabetes, cmet K4.2 BUN 19, Cr 0.89 GFR 83 , CPK 93, TC 237, TG 250, LDL 135, HDL 52, PSA 0.495  -will check UA in future H/o balanitis   Eye Dr. Almetta Lovely [place]  [facility] Dr. Bernardo Heater    I discussed the assessment and treatment plan with the patient. The patient was provided an opportunity to ask questions and all were answered. The patient agreed with the plan and demonstrated an understanding of the instructions.   The patient was advised to call back or seek an in-person evaluation if the symptoms worsen or if the condition fails to improve as anticipated.  Time spent 15 minutes  Delorise Jackson, MD

## 2019-05-22 ENCOUNTER — Telehealth: Payer: Self-pay | Admitting: *Deleted

## 2019-05-22 NOTE — Telephone Encounter (Signed)
Copied from [place] [id number]. Topic: General - Inquiry >> May 22, 2019 11:24 AM Margot Ables wrote: Reason for CRM: pt states he missed a call and asking for call back if needed

## 2019-06-18 ENCOUNTER — Telehealth: Payer: Self-pay | Admitting: *Deleted

## 2019-06-18 NOTE — Telephone Encounter (Signed)
-----   Message from Delorise Jackson, MD sent at 12/18/2018  8:56 AM EST ----- Initiate prolia injections with insurance for osteoporosis   [place]

## 2019-06-18 NOTE — Telephone Encounter (Signed)
Insurance verification for Prolia filed on [employer] Portal. 

## 2019-06-24 ENCOUNTER — Telehealth: Payer: Self-pay | Admitting: Internal Medicine

## 2019-06-24 NOTE — Telephone Encounter (Signed)
Patient has been informed.

## 2019-06-24 NOTE — Telephone Encounter (Signed)
He can come pick up [place]

## 2019-06-24 NOTE — Telephone Encounter (Signed)
Pt called stating he needs a Rx for a hernia belt. Please advise?   Call pt @ [phone]  Thank you!

## 2019-07-09 ENCOUNTER — Telehealth: Payer: Self-pay | Admitting: Internal Medicine

## 2019-07-09 NOTE — Telephone Encounter (Signed)
I called pt to schedule F/u 6-7 months sooner if needed pt vm was full

## 2019-07-16 NOTE — Telephone Encounter (Signed)
Prolia has been approved for this patient, he is currently out of [place] patient will call and schedule when he returns to [facility].

## 2019-09-19 DIAGNOSIS — S91332A Puncture wound without foreign body, left foot, initial encounter: Secondary | ICD-10-CM | POA: Diagnosis not present

## 2019-09-24 NOTE — Telephone Encounter (Signed)
Patient will not be back in [place] until /1/21

## 2019-12-30 ENCOUNTER — Other Ambulatory Visit: Payer: Self-pay | Admitting: Internal Medicine

## 2019-12-30 DIAGNOSIS — N401 Enlarged prostate with lower urinary tract symptoms: Secondary | ICD-10-CM

## 2019-12-30 MED ORDER — TAMSULOSIN HCL 0.4 MG PO CAPS: 0.4000 mg | ORAL_CAPSULE | Freq: Two times a day (BID) | ORAL | 3 refills | Status: DC

## 2020-01-04 ENCOUNTER — Other Ambulatory Visit: Payer: Self-pay

## 2020-01-04 ENCOUNTER — Ambulatory Visit
Admit: 2020-01-04 | Discharge: 2020-01-04 | Disposition: A | Discharge status: Home / Self Care | Payer: Medicare Other | Attending: Urgent Care | Admitting: Urgent Care

## 2020-01-04 ENCOUNTER — Ambulatory Visit
Admission: EM | Admit: 2020-01-04 | Discharge: 2020-01-04 | Disposition: A | Discharge status: Home / Self Care | Payer: Medicare Other | Attending: Urgent Care | Admitting: Urgent Care

## 2020-01-04 DIAGNOSIS — W009XXA Unspecified fall due to ice and snow, initial encounter: Secondary | ICD-10-CM | POA: Insufficient documentation

## 2020-01-04 DIAGNOSIS — R519 Headache, unspecified: Secondary | ICD-10-CM | POA: Diagnosis not present

## 2020-01-04 DIAGNOSIS — S0990XA Unspecified injury of head, initial encounter: Secondary | ICD-10-CM | POA: Diagnosis not present

## 2020-01-04 DIAGNOSIS — G44319 Acute post-traumatic headache, not intractable: Secondary | ICD-10-CM | POA: Insufficient documentation

## 2020-01-04 DIAGNOSIS — S060X0A Concussion without loss of consciousness, initial encounter: Secondary | ICD-10-CM | POA: Diagnosis not present

## 2020-01-04 NOTE — Discharge Instructions (Addendum)
It was very nice seeing you today in clinic. Thank you for entrusting me with your care.   The CT scan of your head was negative for an skull fracture or evidence of brain bleeding. Suspect concussion symptoms. Rest, hydrate, and LIMIT SCREEN time (TV, computer, and phone). May use Tylenol as needed for pain.   Make arrangements to follow up with your regular doctor in 1 week for re-evaluation if not improving. If your symptoms/condition worsens, please seek follow up care either here or in the ER. Please remember, our [place] providers are "right here with you" when you need [place].   Again, it was my pleasure to take care of you today. Thank you for choosing our clinic. I hope that you start to feel better quickly.   Honor Loh, MSN, APRN, FNP-C, CEN Advanced Practice Provider [facility]

## 2020-01-04 NOTE — ED Triage Notes (Addendum)
Pt reports slipped last week on ice and fell backward, striking his head. Hematoma to back of head. 3-4 days after, started with headaches. No blurry vision, no dizziness.

## 2020-01-05 ENCOUNTER — Ambulatory Visit: Payer: Medicare Other

## 2020-01-06 NOTE — ED Provider Notes (Signed)
[place], [place]   Name: Shawn Khan DOB: May 27, 1941 MRN: [id number] CSN: [id number] PCP: McLean-Scocuzza, Nino Glow, MD  Arrival date and time:  01/04/20 1245  Chief Complaint:  Headache   NOTE: Prior to seeing the patient today, I have reviewed the triage nursing documentation and vital signs. Clinical staff has updated patient's PMH/PSHx, current medication list, and drug allergies/intolerances to ensure comprehensive history available to assist in medical decision making.   History:   HPI: Shawn Khan is a 79 y.o. male who presents today with complaints of a 3-4 day history of a post-traumatic headache. Patient advising that "one day last week" he suffered a mechanical fall resulting in him striking the occipital/posterior aspect of his head. Patient is not on daily anticoagulation therapy. Patient describes the mechanism of injury as being secondary to slippery ground due to ice. Patient states, "I didn't slip and sit down. My feet went out from under me and I feel straight back and hit my head". Patient denies LOC. He notes that he was dazed for a few minutes, but ultimately was able to get back upon his feet and back into his home. Immediately following the fall, patient had a short lived headache that went away without treatment. He notes that for 2-3 days following the fall, he felt "fine". Over the course of the last 3-4 days, patient has had headaches and soft tissue soreness to the back of his head. Patient reporting increased difficulties with sleep, however notes that this has been a chronic ongoing issue. He requires the use of OTC sleep aids in order to sleep since his fall. He has not experienced associated nausea, vomiting, or visual changes. He feels as if his mentation is at baseline. Patient denies focal weakness, nuchal rigidity, neck pain, ataxia, or difficulties with his speech. In efforts to conservatively manage his symptoms at home, the patient notes that he has  used APAP, which has helped to improve his symptoms. Despite OTC interventions, patient advises that his headache persists to varying degrees. Patient presents with concerns for possible brain bleeding citing a remote PMH (+) for hemorrhagic CVA. Patient denies other complaints today; no other injuries.   Past Medical History:  Diagnosis Date  . Arthritis   . Chronic hoarseness   . Complication of anesthesia    h/o nasal intubation x 1 hoarseness x 1 week  . Contusion of chest   . 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 Gastritis   . GERD (gastroesophageal reflux disease)   . Hemorrhagic stroke ([place])    more than 5 years ago  . History of chicken pox   . Hyperlipidemia    did not like crestor   . Hypertension   . Hypoglycemia   . Hypogonadism in male   . Insomnia   . Low testosterone in male   . Osteoporosis   . Osteoporosis   . Prediabetes   . Prediabetes    A1C 5.9 06/25/18   . Rotator cuff disorder   . Sleep apnea    cpap  . Stroke ([place])   . Vocal cord dysfunction    in 2018 damage per pt   . Vocal cord nodule   . Zenker diverticulum    1.7 cm   . Zenker diverticulum     Past Surgical History:  Procedure Laterality Date  . APPENDECTOMY     1955  . EYE SURGERY     b/l cataract  . FEMUR FRACTURE SURGERY    .  FRACTURE SURGERY    . LARYNGOSCOPY  09/17/2017   Procedure: LARYNGOSCOPY;  Surgeon: Beverly Gust, MD;  Location: [facility] ORS;  Service: ENT;;  . PENILE BIOPSY     [id number] ulcer with lichenoid inflammation   . rcr Bilateral   . SHOULDER SURGERY     x3 right and left   . THROAT SURGERY     vocal cord nodule  . TONSILLECTOMY    . TONSILLECTOMY     1948    Family History  Problem Relation Age of Onset  . Hearing loss Father   . Other Father        brain tumor  . Parkinson's disease Father   . Arthritis Father   . Cancer Mother        pancreatic   . Diabetes Sister   . Arthritis Son   . Stroke Maternal Grandfather   . Stroke Paternal Grandmother   .  Stroke Paternal Grandfather   . Diabetes Sister   . Hyperlipidemia Sister   . Hypertension Sister   . Asthma Daughter   . Hypertension Daughter   . Asthma Son   . Prostate cancer Neg Hx   . Bladder Cancer Neg Hx   . Kidney cancer Neg Hx     Social History   Tobacco Use  . Smoking status: Never Smoker  . Smokeless tobacco: Never Used  Substance Use Topics  . Alcohol use: No  . Drug use: No    Patient Active Problem List   Diagnosis Date Noted  . Aortic atherosclerosis ([place]) 03/02/2019  . Bradycardia 12/18/2018  . Leg cramps 12/18/2018  . Prediabetes 10/03/2018  . Barrett's esophagus 10/03/2018  . Penile ulcer 09/16/2018  . Zenker's diverticulum 09/16/2018  . Degenerative joint disease involving multiple joints on both sides of body 04/23/2018  . OSA (obstructive sleep apnea) 04/23/2018  . Urinary urgency 04/23/2018  . Seborrheic keratosis 04/23/2018  . Microscopic hematuria 11/20/2017  . Acquired phimosis 11/20/2017  . Benign essential hypertension 11/20/2017  . Benign prostatic hyperplasia with urinary obstruction 11/20/2017  . Chronic hoarseness 11/20/2017  . Contusion of chest 11/20/2017  . Disorder of bone and articular cartilage 11/20/2017  . Disorder of rotator cuff 11/20/2017  . Dysphagia 11/20/2017  . Gastroesophageal reflux disease 11/20/2017  . Generalized osteoarthritis 11/20/2017  . History of stroke without residual deficits 11/20/2017  . Hypoxemia 11/20/2017  . Impaired fasting glucose 11/20/2017  . Increased frequency of urination 11/20/2017  . Insomnia 11/20/2017  . Backache 11/20/2017  . Mixed hyperlipidemia 11/20/2017  . Obstructive sleep apnea syndrome 11/20/2017  . Osteoporosis 11/20/2017  . Urinary urgency 11/20/2017  . Shoulder joint painful on movement 11/20/2017  . Seborrheic keratosis 11/20/2017  . Rib pain 11/20/2017  . Pharyngitis 11/20/2017  . Male hypogonadism 11/20/2017    Home Medications:    No outpatient medications have  been marked as taking for the 01/04/20 encounter [facility] Encounter).    Allergies:   Patient has no known allergies.  Review of Systems (ROS): Review of Systems  Constitutional: Positive for fatigue. Negative for chills and fever.  Eyes: Negative for photophobia, pain and visual disturbance.  Respiratory: Negative for cough and shortness of breath.   Cardiovascular: Negative for chest pain and palpitations.  Gastrointestinal: Negative for nausea and vomiting.  Musculoskeletal: Negative for back pain and neck pain.  Neurological: Positive for headaches. Negative for dizziness, syncope, speech difficulty, weakness and numbness.  Psychiatric/Behavioral: Positive for sleep disturbance.  All other systems reviewed and are negative.  Vital Signs: Today's Vitals   01/04/20 1319 01/04/20 1322 01/04/20 1420  BP: 132/82    Pulse: (!) 59    Resp: 17    Temp: 97.8 F (36.6 C)    TempSrc: Oral    SpO2: 100%    Weight:  190 lb (86.2 kg)   Height:  5\' 9"  (1.753 m)   PainSc:  6  6     Physical Exam: Physical Exam  Constitutional: He is oriented to person, place, and time and well-developed, well-nourished, and in no distress.  HENT:  Head: Normocephalic. Head is with contusion (+) hematoma overlying occipital scalp. No evidence of underlying skull fracture; no crepitus or depressions. Head is without raccoon's eyes and without Battle's sign.  Right Ear: Tympanic membrane normal. No hemotympanum.  Left Ear: Tympanic membrane normal. No hemotympanum.  Nose: Nose normal.  Mouth/Throat: Uvula is midline, oropharynx is clear and moist and mucous membranes are normal.  Eyes: Pupils are equal, round, and reactive to light. EOM are normal.  Cardiovascular: Normal rate, regular rhythm, normal heart sounds and intact distal pulses.  Pulmonary/Chest: Effort normal and breath sounds normal.  Musculoskeletal:        General: Normal range of motion.     Cervical back: Full passive range of  motion without pain, normal range of motion and neck supple.  Neurological: He is alert and oriented to person, place, and time. He has normal motor skills, normal sensation, normal strength, normal reflexes and intact cranial nerves. Gait normal. GCS score is 15.  Skin: Skin is warm and dry. No rash noted. He is not diaphoretic.  Psychiatric: Mood, memory, affect and judgment normal.  Nursing note and vitals reviewed.   Urgent Care Treatments / Results:   Orders Placed This Encounter  Procedures  . CT Head Wo Contrast    LABS: PLEASE NOTE: all labs that were ordered this encounter are listed, however only abnormal results are displayed. Labs Reviewed - No data to display  EKG: -None  RADIOLOGY: CT Head Wo Contrast  Result Date: 01/04/2020 CLINICAL DATA:  Status post fall last week with headaches. EXAM: CT HEAD WITHOUT CONTRAST TECHNIQUE: Contiguous axial images were obtained from the base of the skull through the vertex without intravenous contrast. COMPARISON:  None. FINDINGS: Brain: No evidence of acute infarction, hemorrhage, hydrocephalus, extra-axial collection or mass lesion/mass effect. There is chronic diffuse atrophy. Chronic bilateral periventricular white matter small vessel ischemic changes noted. Vascular: No hyperdense vessel is noted. Skull: Normal. Negative for fracture or focal lesion. Sinuses/Orbits: No acute finding. Other: None. IMPRESSION: No focal acute intracranial abnormality identified. Chronic diffuse atrophy. Chronic bilateral periventricular white matter small vessel ischemic change. Electronically Signed   By: Abelardo Diesel M.D.   On: 01/04/2020 14:07    PROCEDURES: Procedures  MEDICATIONS RECEIVED THIS VISIT: Medications - No data to display  PERTINENT CLINICAL COURSE NOTES/UPDATES:   Initial Impression / Assessment and Plan / Urgent Care Course:  Pertinent labs & imaging results that were available during my care of the patient were personally  reviewed by me and considered in my medical decision making (see lab/imaging section of note for values and interpretations).  Shawn Khan is a 79 y.o. male who presents to [facility] today with complaints of Headache   Patient is well appearing overall in clinic today. He does not appear to be in any acute distress. Presenting symptoms (see HPI) and exam as documented above. Patient with 3-4 days of headaches following mechanical fall.  No ALOC resulting from the fall. No anticoagulant use. Patient denies neck pain. Exam reassuring revealing patient who is grossly NI. Other than the headache, patient has no complaints. He reports baseline mentation; drove to clinic today. Given MOI and age, will proceed with CT imaging of the head to assess for intracranial abnormality. Given the length of time since his injury, coupled with exam and complaints of no pain, will defer imaging of cervical spine at this juncture.   CT imaging of the head without contrast revealed no acute findings. Specifically, there was no evidence of skull fracture or ICH. Reviewed imaging results with the patient. Suspect headaches are related to a post-concussive syndrome as he is having concurrent fatigue and issues with his sleep. Reassurance provided. Recommended that patient rest and ensure adequate hydration. He was encouraged to limit strenuous activity and tasks requiring complex thought. Suggested that patient limit all screen times (TV, computer, and phone) to help reduce the severity of his headaches. Patient advised to continue use of ice packs to the back of his head to help resolve hematoma. Given PMH (+) hemorrhagic CVA, patient has been advised by his regular doctors to avoid NSAIDs. Patient advising that he has taken sleep aids which have made him feel off balance, thus intervention choices are felt to be limited in efforts to ensure safety. Patient to use APAP on an as needed basis, while concurrently  employing the aforementioned interventions, to help with his headache.   Discussed follow up with primary care physician in 1 week for re-evaluation. I have reviewed the follow up and strict return precautions for any new or worsening symptoms. Patient is aware of symptoms that would be deemed urgent/emergent, and would thus require further evaluation either here or in the emergency department. At the time of discharge, he verbalized understanding and consent with the discharge plan as it was reviewed with him. All questions were fielded by provider and/or clinic staff prior to patient discharge.    Final Clinical Impressions / Urgent Care Diagnoses:   Final diagnoses:  Acute post-traumatic headache, not intractable  Concussion without loss of consciousness, initial encounter  Fall due to slipping on ice or snow, initial encounter    New Prescriptions:  [place] Controlled Substance Registry consulted? Not Applicable  No orders of the defined types were placed in this encounter.   Recommended Follow up Care:  Patient encouraged to follow up with the following provider within the specified time frame, or sooner as dictated by the severity of his symptoms. As always, he was instructed that for any urgent/emergent care needs, he should seek care either here or in the emergency department for more immediate evaluation.  Follow-up Information    McLean-Scocuzza, Nino Glow, MD In 1 week.   Specialty: Internal Medicine Why: General reassessment of symptoms if not improving Contact information: [address] [place] [address] [phone]         NOTE: This note was prepared using Dragon dictation software along with smaller phrase technology. Despite my best ability to proofread, there is the potential that transcriptional errors may still occur from this process, and are completely unintentional.    Karen Kitchens, NP 01/06/20 1222

## 2020-02-10 ENCOUNTER — Telehealth: Payer: Self-pay | Admitting: *Deleted

## 2020-02-10 NOTE — Telephone Encounter (Signed)
Patient states he does not recall PCP discussing Prolia and that this would not work for him due to he is out of town a lot he does not wish to change medications.

## 2020-02-10 NOTE — Telephone Encounter (Signed)
-----   Message from Nanci Pina, RN sent at 09/24/2019  9:54 AM EDT ----- Regarding: Prolia Reverify patient for Prolia, contact patient for first Prolia injection he has been [place]. Will need Vit D and calcium level before first prolia injection.

## 2020-04-01 ENCOUNTER — Other Ambulatory Visit: Payer: Self-pay | Admitting: Internal Medicine

## 2020-04-01 DIAGNOSIS — R350 Frequency of micturition: Secondary | ICD-10-CM

## 2020-04-01 DIAGNOSIS — N401 Enlarged prostate with lower urinary tract symptoms: Secondary | ICD-10-CM

## 2020-04-01 MED ORDER — TAMSULOSIN HCL 0.4 MG PO CAPS: 0.4000 mg | ORAL_CAPSULE | Freq: Two times a day (BID) | ORAL | 1 refills | Status: AC

## 2020-04-26 ENCOUNTER — Other Ambulatory Visit: Payer: Self-pay | Admitting: Internal Medicine

## 2020-04-26 DIAGNOSIS — Z8673 Personal history of transient ischemic attack (TIA), and cerebral infarction without residual deficits: Secondary | ICD-10-CM | POA: Diagnosis not present

## 2020-04-26 DIAGNOSIS — R1319 Other dysphagia: Secondary | ICD-10-CM

## 2020-04-26 DIAGNOSIS — I10 Essential (primary) hypertension: Secondary | ICD-10-CM | POA: Insufficient documentation

## 2020-04-26 DIAGNOSIS — Z79899 Other long term (current) drug therapy: Secondary | ICD-10-CM | POA: Diagnosis not present

## 2020-04-26 DIAGNOSIS — E7800 Pure hypercholesterolemia, unspecified: Secondary | ICD-10-CM | POA: Insufficient documentation

## 2020-04-26 DIAGNOSIS — N138 Other obstructive and reflux uropathy: Secondary | ICD-10-CM | POA: Diagnosis not present

## 2020-04-26 DIAGNOSIS — M810 Age-related osteoporosis without current pathological fracture: Secondary | ICD-10-CM | POA: Diagnosis not present

## 2020-04-26 DIAGNOSIS — Z125 Encounter for screening for malignant neoplasm of prostate: Secondary | ICD-10-CM | POA: Diagnosis not present

## 2020-04-26 DIAGNOSIS — Z1211 Encounter for screening for malignant neoplasm of colon: Secondary | ICD-10-CM | POA: Diagnosis not present

## 2020-04-26 DIAGNOSIS — N401 Enlarged prostate with lower urinary tract symptoms: Secondary | ICD-10-CM | POA: Diagnosis not present

## 2020-04-26 DIAGNOSIS — G8929 Other chronic pain: Secondary | ICD-10-CM | POA: Insufficient documentation

## 2020-04-26 DIAGNOSIS — R7303 Prediabetes: Secondary | ICD-10-CM | POA: Diagnosis not present

## 2020-04-29 DIAGNOSIS — Z1211 Encounter for screening for malignant neoplasm of colon: Secondary | ICD-10-CM | POA: Diagnosis not present

## 2020-04-29 DIAGNOSIS — Z125 Encounter for screening for malignant neoplasm of prostate: Secondary | ICD-10-CM | POA: Diagnosis not present

## 2020-04-29 DIAGNOSIS — Z79899 Other long term (current) drug therapy: Secondary | ICD-10-CM | POA: Diagnosis not present

## 2020-04-29 DIAGNOSIS — Z961 Presence of intraocular lens: Secondary | ICD-10-CM | POA: Diagnosis not present

## 2020-04-29 DIAGNOSIS — E7800 Pure hypercholesterolemia, unspecified: Secondary | ICD-10-CM | POA: Diagnosis not present

## 2020-04-29 DIAGNOSIS — I10 Essential (primary) hypertension: Secondary | ICD-10-CM | POA: Diagnosis not present

## 2020-04-29 DIAGNOSIS — R7303 Prediabetes: Secondary | ICD-10-CM | POA: Diagnosis not present

## 2020-05-03 DIAGNOSIS — M9902 Segmental and somatic dysfunction of thoracic region: Secondary | ICD-10-CM | POA: Diagnosis not present

## 2020-05-03 DIAGNOSIS — M531 Cervicobrachial syndrome: Secondary | ICD-10-CM | POA: Diagnosis not present

## 2020-05-03 DIAGNOSIS — M546 Pain in thoracic spine: Secondary | ICD-10-CM | POA: Diagnosis not present

## 2020-05-03 DIAGNOSIS — M9903 Segmental and somatic dysfunction of lumbar region: Secondary | ICD-10-CM | POA: Diagnosis not present

## 2020-05-03 DIAGNOSIS — M9901 Segmental and somatic dysfunction of cervical region: Secondary | ICD-10-CM | POA: Diagnosis not present

## 2020-05-03 DIAGNOSIS — M545 Low back pain: Secondary | ICD-10-CM | POA: Diagnosis not present

## 2020-05-04 ENCOUNTER — Ambulatory Visit
Admission: RE | Admit: 2020-05-04 | Discharge: 2020-05-04 | Disposition: A | Discharge status: Home / Self Care | Payer: Medicare Other | Source: Ambulatory Visit | Attending: Internal Medicine | Admitting: Internal Medicine

## 2020-05-04 ENCOUNTER — Other Ambulatory Visit: Payer: Self-pay

## 2020-05-04 DIAGNOSIS — K219 Gastro-esophageal reflux disease without esophagitis: Secondary | ICD-10-CM | POA: Diagnosis not present

## 2020-05-04 DIAGNOSIS — R1319 Other dysphagia: Secondary | ICD-10-CM | POA: Insufficient documentation

## 2020-05-04 DIAGNOSIS — K225 Diverticulum of esophagus, acquired: Secondary | ICD-10-CM | POA: Diagnosis not present

## 2020-05-05 DIAGNOSIS — M9901 Segmental and somatic dysfunction of cervical region: Secondary | ICD-10-CM | POA: Diagnosis not present

## 2020-05-05 DIAGNOSIS — M9903 Segmental and somatic dysfunction of lumbar region: Secondary | ICD-10-CM | POA: Diagnosis not present

## 2020-05-05 DIAGNOSIS — M9902 Segmental and somatic dysfunction of thoracic region: Secondary | ICD-10-CM | POA: Diagnosis not present

## 2020-05-05 DIAGNOSIS — M546 Pain in thoracic spine: Secondary | ICD-10-CM | POA: Diagnosis not present

## 2020-05-05 DIAGNOSIS — M545 Low back pain: Secondary | ICD-10-CM | POA: Diagnosis not present

## 2020-05-05 DIAGNOSIS — M531 Cervicobrachial syndrome: Secondary | ICD-10-CM | POA: Diagnosis not present

## 2020-05-11 DIAGNOSIS — M9902 Segmental and somatic dysfunction of thoracic region: Secondary | ICD-10-CM | POA: Diagnosis not present

## 2020-05-11 DIAGNOSIS — M531 Cervicobrachial syndrome: Secondary | ICD-10-CM | POA: Diagnosis not present

## 2020-05-11 DIAGNOSIS — M9901 Segmental and somatic dysfunction of cervical region: Secondary | ICD-10-CM | POA: Diagnosis not present

## 2020-05-11 DIAGNOSIS — M545 Low back pain: Secondary | ICD-10-CM | POA: Diagnosis not present

## 2020-05-11 DIAGNOSIS — M546 Pain in thoracic spine: Secondary | ICD-10-CM | POA: Diagnosis not present

## 2020-05-11 DIAGNOSIS — M9903 Segmental and somatic dysfunction of lumbar region: Secondary | ICD-10-CM | POA: Diagnosis not present

## 2020-05-13 DIAGNOSIS — M545 Low back pain: Secondary | ICD-10-CM | POA: Diagnosis not present

## 2020-05-13 DIAGNOSIS — M9902 Segmental and somatic dysfunction of thoracic region: Secondary | ICD-10-CM | POA: Diagnosis not present

## 2020-05-13 DIAGNOSIS — M531 Cervicobrachial syndrome: Secondary | ICD-10-CM | POA: Diagnosis not present

## 2020-05-13 DIAGNOSIS — M9903 Segmental and somatic dysfunction of lumbar region: Secondary | ICD-10-CM | POA: Diagnosis not present

## 2020-05-13 DIAGNOSIS — M9901 Segmental and somatic dysfunction of cervical region: Secondary | ICD-10-CM | POA: Diagnosis not present

## 2020-05-13 DIAGNOSIS — M546 Pain in thoracic spine: Secondary | ICD-10-CM | POA: Diagnosis not present

## 2020-05-18 DIAGNOSIS — M9901 Segmental and somatic dysfunction of cervical region: Secondary | ICD-10-CM | POA: Diagnosis not present

## 2020-05-18 DIAGNOSIS — M545 Low back pain: Secondary | ICD-10-CM | POA: Diagnosis not present

## 2020-05-18 DIAGNOSIS — M9903 Segmental and somatic dysfunction of lumbar region: Secondary | ICD-10-CM | POA: Diagnosis not present

## 2020-05-18 DIAGNOSIS — M546 Pain in thoracic spine: Secondary | ICD-10-CM | POA: Diagnosis not present

## 2020-05-18 DIAGNOSIS — M531 Cervicobrachial syndrome: Secondary | ICD-10-CM | POA: Diagnosis not present

## 2020-05-18 DIAGNOSIS — M9902 Segmental and somatic dysfunction of thoracic region: Secondary | ICD-10-CM | POA: Diagnosis not present

## 2020-05-20 DIAGNOSIS — M9903 Segmental and somatic dysfunction of lumbar region: Secondary | ICD-10-CM | POA: Diagnosis not present

## 2020-05-20 DIAGNOSIS — M546 Pain in thoracic spine: Secondary | ICD-10-CM | POA: Diagnosis not present

## 2020-05-20 DIAGNOSIS — M9902 Segmental and somatic dysfunction of thoracic region: Secondary | ICD-10-CM | POA: Diagnosis not present

## 2020-05-20 DIAGNOSIS — M531 Cervicobrachial syndrome: Secondary | ICD-10-CM | POA: Diagnosis not present

## 2020-05-20 DIAGNOSIS — M545 Low back pain: Secondary | ICD-10-CM | POA: Diagnosis not present

## 2020-05-20 DIAGNOSIS — M9901 Segmental and somatic dysfunction of cervical region: Secondary | ICD-10-CM | POA: Diagnosis not present

## 2020-05-24 DIAGNOSIS — M545 Low back pain: Secondary | ICD-10-CM | POA: Diagnosis not present

## 2020-05-24 DIAGNOSIS — M546 Pain in thoracic spine: Secondary | ICD-10-CM | POA: Diagnosis not present

## 2020-05-24 DIAGNOSIS — M531 Cervicobrachial syndrome: Secondary | ICD-10-CM | POA: Diagnosis not present

## 2020-05-24 DIAGNOSIS — M9901 Segmental and somatic dysfunction of cervical region: Secondary | ICD-10-CM | POA: Diagnosis not present

## 2020-05-24 DIAGNOSIS — M9903 Segmental and somatic dysfunction of lumbar region: Secondary | ICD-10-CM | POA: Diagnosis not present

## 2020-05-24 DIAGNOSIS — M9902 Segmental and somatic dysfunction of thoracic region: Secondary | ICD-10-CM | POA: Diagnosis not present

## 2020-05-31 DIAGNOSIS — M9901 Segmental and somatic dysfunction of cervical region: Secondary | ICD-10-CM | POA: Diagnosis not present

## 2020-05-31 DIAGNOSIS — M546 Pain in thoracic spine: Secondary | ICD-10-CM | POA: Diagnosis not present

## 2020-05-31 DIAGNOSIS — M9903 Segmental and somatic dysfunction of lumbar region: Secondary | ICD-10-CM | POA: Diagnosis not present

## 2020-05-31 DIAGNOSIS — M545 Low back pain: Secondary | ICD-10-CM | POA: Diagnosis not present

## 2020-05-31 DIAGNOSIS — M9902 Segmental and somatic dysfunction of thoracic region: Secondary | ICD-10-CM | POA: Diagnosis not present

## 2020-05-31 DIAGNOSIS — M531 Cervicobrachial syndrome: Secondary | ICD-10-CM | POA: Diagnosis not present

## 2020-10-27 DIAGNOSIS — Z0000 Encounter for general adult medical examination without abnormal findings: Secondary | ICD-10-CM | POA: Diagnosis not present

## 2020-10-27 DIAGNOSIS — R739 Hyperglycemia, unspecified: Secondary | ICD-10-CM | POA: Diagnosis not present

## 2020-10-27 DIAGNOSIS — I10 Essential (primary) hypertension: Secondary | ICD-10-CM | POA: Diagnosis not present

## 2020-10-27 DIAGNOSIS — G4733 Obstructive sleep apnea (adult) (pediatric): Secondary | ICD-10-CM | POA: Diagnosis not present

## 2020-10-27 DIAGNOSIS — Z79899 Other long term (current) drug therapy: Secondary | ICD-10-CM | POA: Diagnosis not present

## 2020-10-27 DIAGNOSIS — N400 Enlarged prostate without lower urinary tract symptoms: Secondary | ICD-10-CM | POA: Diagnosis not present

## 2020-10-27 DIAGNOSIS — E785 Hyperlipidemia, unspecified: Secondary | ICD-10-CM | POA: Diagnosis not present

## 2020-10-28 DIAGNOSIS — Z79899 Other long term (current) drug therapy: Secondary | ICD-10-CM | POA: Diagnosis not present

## 2020-10-28 DIAGNOSIS — R7303 Prediabetes: Secondary | ICD-10-CM | POA: Diagnosis not present

## 2020-10-28 DIAGNOSIS — R413 Other amnesia: Secondary | ICD-10-CM | POA: Diagnosis not present

## 2020-10-28 DIAGNOSIS — E7800 Pure hypercholesterolemia, unspecified: Secondary | ICD-10-CM | POA: Diagnosis not present

## 2020-11-21 DIAGNOSIS — K225 Diverticulum of esophagus, acquired: Secondary | ICD-10-CM | POA: Diagnosis not present

## 2020-11-21 DIAGNOSIS — G4733 Obstructive sleep apnea (adult) (pediatric): Secondary | ICD-10-CM | POA: Diagnosis not present

## 2021-05-26 DIAGNOSIS — E559 Vitamin D deficiency, unspecified: Secondary | ICD-10-CM | POA: Insufficient documentation

## 2021-05-26 DIAGNOSIS — E538 Deficiency of other specified B group vitamins: Secondary | ICD-10-CM | POA: Insufficient documentation

## 2021-06-12 ENCOUNTER — Emergency Department: Payer: Medicare Other

## 2021-06-12 ENCOUNTER — Other Ambulatory Visit: Payer: Self-pay

## 2021-06-12 ENCOUNTER — Ambulatory Visit (INDEPENDENT_AMBULATORY_CARE_PROVIDER_SITE_OTHER): Payer: Medicare Other

## 2021-06-12 ENCOUNTER — Encounter: Payer: Self-pay | Admitting: Emergency Medicine

## 2021-06-12 ENCOUNTER — Inpatient Hospital Stay
Admission: EM | Admit: 2021-06-12 | Discharge: 2021-06-15 | DRG: 444 | Disposition: A | Discharge status: Home / Self Care | Payer: Medicare Other | Attending: Internal Medicine | Admitting: Internal Medicine

## 2021-06-12 ENCOUNTER — Ambulatory Visit (INDEPENDENT_AMBULATORY_CARE_PROVIDER_SITE_OTHER)
Admission: EM | Admit: 2021-06-12 | Discharge: 2021-06-12 | Disposition: A | Discharge status: Home / Self Care | Payer: Medicare Other | Source: Home / Self Care

## 2021-06-12 DIAGNOSIS — R7303 Prediabetes: Secondary | ICD-10-CM | POA: Diagnosis present

## 2021-06-12 DIAGNOSIS — Z20822 Contact with and (suspected) exposure to covid-19: Secondary | ICD-10-CM | POA: Diagnosis present

## 2021-06-12 DIAGNOSIS — K219 Gastro-esophageal reflux disease without esophagitis: Secondary | ICD-10-CM | POA: Diagnosis present

## 2021-06-12 DIAGNOSIS — J383 Other diseases of vocal cords: Secondary | ICD-10-CM | POA: Diagnosis present

## 2021-06-12 DIAGNOSIS — E785 Hyperlipidemia, unspecified: Secondary | ICD-10-CM | POA: Diagnosis present

## 2021-06-12 DIAGNOSIS — R490 Dysphonia: Secondary | ICD-10-CM | POA: Diagnosis present

## 2021-06-12 DIAGNOSIS — R053 Chronic cough: Secondary | ICD-10-CM | POA: Diagnosis present

## 2021-06-12 DIAGNOSIS — E291 Testicular hypofunction: Secondary | ICD-10-CM | POA: Diagnosis present

## 2021-06-12 DIAGNOSIS — K760 Fatty (change of) liver, not elsewhere classified: Secondary | ICD-10-CM | POA: Diagnosis present

## 2021-06-12 DIAGNOSIS — K5730 Diverticulosis of large intestine without perforation or abscess without bleeding: Secondary | ICD-10-CM | POA: Diagnosis present

## 2021-06-12 DIAGNOSIS — K8020 Calculus of gallbladder without cholecystitis without obstruction: Secondary | ICD-10-CM

## 2021-06-12 DIAGNOSIS — W19XXXA Unspecified fall, initial encounter: Secondary | ICD-10-CM | POA: Diagnosis present

## 2021-06-12 DIAGNOSIS — Z8619 Personal history of other infectious and parasitic diseases: Secondary | ICD-10-CM | POA: Diagnosis not present

## 2021-06-12 DIAGNOSIS — R1901 Right upper quadrant abdominal swelling, mass and lump: Secondary | ICD-10-CM

## 2021-06-12 DIAGNOSIS — G4733 Obstructive sleep apnea (adult) (pediatric): Secondary | ICD-10-CM | POA: Diagnosis present

## 2021-06-12 DIAGNOSIS — Z83438 Family history of other disorder of lipoprotein metabolism and other lipidemia: Secondary | ICD-10-CM | POA: Diagnosis not present

## 2021-06-12 DIAGNOSIS — K651 Peritoneal abscess: Secondary | ICD-10-CM | POA: Diagnosis present

## 2021-06-12 DIAGNOSIS — K2270 Barrett's esophagus without dysplasia: Secondary | ICD-10-CM | POA: Diagnosis present

## 2021-06-12 DIAGNOSIS — Z79899 Other long term (current) drug therapy: Secondary | ICD-10-CM

## 2021-06-12 DIAGNOSIS — K8000 Calculus of gallbladder with acute cholecystitis without obstruction: Principal | ICD-10-CM | POA: Diagnosis present

## 2021-06-12 DIAGNOSIS — M810 Age-related osteoporosis without current pathological fracture: Secondary | ICD-10-CM | POA: Diagnosis present

## 2021-06-12 DIAGNOSIS — K810 Acute cholecystitis: Secondary | ICD-10-CM | POA: Diagnosis present

## 2021-06-12 DIAGNOSIS — K819 Cholecystitis, unspecified: Secondary | ICD-10-CM

## 2021-06-12 DIAGNOSIS — Z823 Family history of stroke: Secondary | ICD-10-CM

## 2021-06-12 DIAGNOSIS — Z8249 Family history of ischemic heart disease and other diseases of the circulatory system: Secondary | ICD-10-CM | POA: Diagnosis not present

## 2021-06-12 DIAGNOSIS — M25552 Pain in left hip: Secondary | ICD-10-CM | POA: Diagnosis present

## 2021-06-12 DIAGNOSIS — Z8673 Personal history of transient ischemic attack (TIA), and cerebral infarction without residual deficits: Secondary | ICD-10-CM

## 2021-06-12 DIAGNOSIS — R1011 Right upper quadrant pain: Secondary | ICD-10-CM

## 2021-06-12 DIAGNOSIS — I10 Essential (primary) hypertension: Secondary | ICD-10-CM | POA: Diagnosis present

## 2021-06-12 DIAGNOSIS — Z978 Presence of other specified devices: Secondary | ICD-10-CM | POA: Diagnosis not present

## 2021-06-12 DIAGNOSIS — R52 Pain, unspecified: Secondary | ICD-10-CM

## 2021-06-12 LAB — URINALYSIS, COMPLETE (UACMP) WITH MICROSCOPIC
Bacteria, UA: NONE SEEN
Bilirubin Urine: NEGATIVE
Glucose, UA: NEGATIVE mg/dL
Leukocytes,Ua: NEGATIVE
Nitrite: NEGATIVE
Protein, ur: 30 mg/dL — AB
Specific Gravity, Urine: 1.02 (ref 1.005–1.030)
pH: 5.5 (ref 5.0–8.0)

## 2021-06-12 LAB — CBC WITH DIFFERENTIAL/PLATELET
Abs Immature Granulocytes: 0.11 10*3/uL — ABNORMAL HIGH (ref 0.00–0.07)
Basophils Absolute: 0.1 10*3/uL (ref 0.0–0.1)
Basophils Relative: 0 %
Eosinophils Absolute: 0 10*3/uL (ref 0.0–0.5)
Eosinophils Relative: 0 %
HCT: 42.2 % (ref 39.0–52.0)
Hemoglobin: 14.6 g/dL (ref 13.0–17.0)
Immature Granulocytes: 1 %
Lymphocytes Relative: 3 %
Lymphs Abs: 0.5 10*3/uL — ABNORMAL LOW (ref 0.7–4.0)
MCH: 31.5 pg (ref 26.0–34.0)
MCHC: 34.6 g/dL (ref 30.0–36.0)
MCV: 90.9 fL (ref 80.0–100.0)
Monocytes Absolute: 1.8 10*3/uL — ABNORMAL HIGH (ref 0.1–1.0)
Monocytes Relative: 10 %
Neutro Abs: 15.6 10*3/uL — ABNORMAL HIGH (ref 1.7–7.7)
Neutrophils Relative %: 86 %
Platelets: 222 10*3/uL (ref 150–400)
RBC: 4.64 MIL/uL (ref 4.22–5.81)
RDW: 13.4 % (ref 11.5–15.5)
WBC: 18.1 10*3/uL — ABNORMAL HIGH (ref 4.0–10.5)
nRBC: 0 % (ref 0.0–0.2)

## 2021-06-12 LAB — URINALYSIS, ROUTINE W REFLEX MICROSCOPIC
Bacteria, UA: NONE SEEN
Bilirubin Urine: NEGATIVE
Glucose, UA: NEGATIVE mg/dL
Hgb urine dipstick: NEGATIVE
Ketones, ur: NEGATIVE mg/dL
Leukocytes,Ua: NEGATIVE
Nitrite: NEGATIVE
Protein, ur: 30 mg/dL — AB
Specific Gravity, Urine: 1.017 (ref 1.005–1.030)
Squamous Epithelial / HPF: NONE SEEN (ref 0–5)
pH: 5 (ref 5.0–8.0)

## 2021-06-12 LAB — COMPREHENSIVE METABOLIC PANEL
ALT: 20 U/L (ref 0–44)
AST: 23 U/L (ref 15–41)
Albumin: 3.8 g/dL (ref 3.5–5.0)
Alkaline Phosphatase: 69 U/L (ref 38–126)
Anion gap: 8 (ref 5–15)
BUN: 17 mg/dL (ref 8–23)
CO2: 23 mmol/L (ref 22–32)
Calcium: 9.1 mg/dL (ref 8.9–10.3)
Chloride: 103 mmol/L (ref 98–111)
Creatinine, Ser: 0.93 mg/dL (ref 0.61–1.24)
GFR, Estimated: >60 mL/min (ref >60)
Glucose, Bld: 153 mg/dL — ABNORMAL HIGH (ref 70–99)
Potassium: 4.1 mmol/L (ref 3.5–5.1)
Sodium: 134 mmol/L — ABNORMAL LOW (ref 135–145)
Total Bilirubin: 0.7 mg/dL (ref 0.3–1.2)
Total Protein: 8.3 g/dL — ABNORMAL HIGH (ref 6.5–8.1)

## 2021-06-12 LAB — SEDIMENTATION RATE: Sed Rate: 14 mm/hr (ref 0–20)

## 2021-06-12 LAB — RESP PANEL BY RT-PCR (FLU A&B, COVID) ARPGX2
Influenza A by PCR: NEGATIVE
Influenza B by PCR: NEGATIVE
SARS Coronavirus 2 by RT PCR: NEGATIVE

## 2021-06-12 LAB — LACTIC ACID, PLASMA: Lactic Acid, Venous: 1.7 mmol/L (ref 0.5–1.9)

## 2021-06-12 LAB — LIPASE, BLOOD: Lipase: 39 U/L (ref 11–51)

## 2021-06-12 MED ORDER — PIPERACILLIN-TAZOBACTAM 3.375 G IVPB 30 MIN: 3.3750 g | Freq: Four times a day (QID) | INTRAVENOUS | Status: DC

## 2021-06-12 MED ORDER — PANTOPRAZOLE SODIUM 40 MG IV SOLR
40.0000 mg | Freq: Two times a day (BID) | INTRAVENOUS | Status: DC
  Administered 2021-06-12 – 2021-06-13 (×2): 40 mg via INTRAVENOUS
  Filled 2021-06-12 (×2): qty 40

## 2021-06-12 MED ORDER — PIPERACILLIN-TAZOBACTAM 3.375 G IVPB
3.3750 g | Freq: Three times a day (TID) | INTRAVENOUS | Status: DC
  Administered 2021-06-12 – 2021-06-15 (×8): 3.375 g via INTRAVENOUS
  Filled 2021-06-12 (×8): qty 50

## 2021-06-12 MED ORDER — ONDANSETRON HCL 4 MG/2ML IJ SOLN: 4.0000 mg | Freq: Four times a day (QID) | INTRAMUSCULAR | Status: DC | PRN

## 2021-06-12 MED ORDER — IOHEXOL 300 MG/ML  SOLN
100.0000 mL | Freq: Once | INTRAMUSCULAR | Status: AC | PRN
  Administered 2021-06-12: 100 mL via INTRAVENOUS

## 2021-06-12 MED ORDER — HEPARIN SODIUM (PORCINE) 5000 UNIT/ML IJ SOLN
5000.0000 [IU] | Freq: Three times a day (TID) | INTRAMUSCULAR | Status: DC
  Administered 2021-06-13 – 2021-06-15 (×7): 5000 [IU] via SUBCUTANEOUS
  Filled 2021-06-12 (×7): qty 1

## 2021-06-12 MED ORDER — SODIUM CHLORIDE 0.9 % IV SOLN
2.0000 g | INTRAVENOUS | Status: DC
  Filled 2021-06-12: qty 2

## 2021-06-12 MED ORDER — ONDANSETRON HCL 4 MG PO TABS: 4.0000 mg | ORAL_TABLET | Freq: Four times a day (QID) | ORAL | Status: DC | PRN

## 2021-06-12 MED ORDER — SODIUM CHLORIDE 0.9 % IV SOLN: Freq: Once | INTRAVENOUS | Status: AC

## 2021-06-12 MED ORDER — PIPERACILLIN-TAZOBACTAM 3.375 G IVPB 30 MIN
3.3750 g | Freq: Once | INTRAVENOUS | Status: AC
  Administered 2021-06-12: 3.375 g via INTRAVENOUS
  Filled 2021-06-12: qty 50

## 2021-06-12 MED ORDER — SODIUM CHLORIDE 0.9 % IV BOLUS
1000.0000 mL | Freq: Once | INTRAVENOUS | Status: AC
  Administered 2021-06-12: 1000 mL via INTRAVENOUS

## 2021-06-12 NOTE — ED Notes (Signed)
Message Admitting MD if additional labs where needed , nothing additional needed at this moment

## 2021-06-12 NOTE — ED Provider Notes (Signed)
[facility] Emergency Department Provider Note  ____________________________________________  Time seen: Approximately 3:10 PM  I have reviewed the triage vital signs and the nursing notes.   HISTORY  Chief Complaint Abdominal Pain    HPI BOONE GEAR is a 80 y.o. male with a history of stroke, prediabetes, hypertension, GERD, prior cholecystitis with associated intra-abdominal abscess in February 2022 that was treated with percutaneous drainage who now comes the ED complaining of right upper quadrant abdominal pain for the past 2 days, worse with eating.  No radiation.  No alleviating factors.  Moderate intensity.  Denies vomiting or fever.  Feels like prior episode of cholecystitis.  Last oral intake was yesterday  Past Medical History:  Diagnosis Date   Arthritis    Chronic hoarseness    Complication of anesthesia    h/o nasal intubation x 1 hoarseness x 1 week   Contusion of chest    Cough    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   Gastritis    GERD (gastroesophageal reflux disease)    Hemorrhagic stroke ([place])    more than 5 years ago   History of chicken pox    Hyperlipidemia    did not like crestor    Hypertension    Hypoglycemia    Hypogonadism in male    Insomnia    Low testosterone in male    Osteoporosis    Osteoporosis    Prediabetes    Prediabetes    A1C 5.9 06/25/18    Rotator cuff disorder    Sleep apnea    cpap   Stroke ([place])    Vocal cord dysfunction    in 2018 damage per pt    Vocal cord nodule    Zenker diverticulum    1.7 cm    Zenker diverticulum      Patient Active Problem List   Diagnosis Date Noted   Aortic atherosclerosis ([place]) 03/02/2019   Bradycardia 12/18/2018   Leg cramps 12/18/2018   Prediabetes 10/03/2018   Barrett's esophagus 10/03/2018   Penile ulcer 09/16/2018   Zenker's diverticulum 09/16/2018   Degenerative joint disease involving multiple joints on both sides of body 04/23/2018   OSA (obstructive  sleep apnea) 04/23/2018   Urinary urgency 04/23/2018   Seborrheic keratosis 04/23/2018   Microscopic hematuria 11/20/2017   Acquired phimosis 11/20/2017   Benign essential hypertension 11/20/2017   Benign prostatic hyperplasia with urinary obstruction 11/20/2017   Chronic hoarseness 11/20/2017   Contusion of chest 11/20/2017   Disorder of bone and articular cartilage 11/20/2017   Disorder of rotator cuff 11/20/2017   Dysphagia 11/20/2017   Gastroesophageal reflux disease 11/20/2017   Generalized osteoarthritis 11/20/2017   History of stroke without residual deficits 11/20/2017   Hypoxemia 11/20/2017   Impaired fasting glucose 11/20/2017   Increased frequency of urination 11/20/2017   Insomnia 11/20/2017   Backache 11/20/2017   Mixed hyperlipidemia 11/20/2017   Obstructive sleep apnea syndrome 11/20/2017   Osteoporosis 11/20/2017   Urinary urgency 11/20/2017   Shoulder joint painful on movement 11/20/2017   Seborrheic keratosis 11/20/2017   Rib pain 11/20/2017   Pharyngitis 11/20/2017   Male hypogonadism 11/20/2017     Past Surgical History:  Procedure Laterality Date   APPENDECTOMY     1955   EYE SURGERY     b/l cataract   FEMUR FRACTURE SURGERY     FRACTURE SURGERY     LARYNGOSCOPY  09/17/2017   Procedure: LARYNGOSCOPY;  Surgeon: Beverly Gust, MD;  Location: [facility] ORS;  Service: ENT;;   PENILE BIOPSY     06/8937 ulcer with lichenoid inflammation    rcr Bilateral    SHOULDER SURGERY     x3 right and left    THROAT SURGERY     vocal cord nodule   TONSILLECTOMY     TONSILLECTOMY     1948     Prior to Admission medications   Medication Sig Start Date End Date Taking? Authorizing Provider  clobetasol ointment (TEMOVATE) 0.05 % APPLY DAILY TO AFFECTED AREA UP TO 2 WEEKS. 02/23/19   [provider]  NON FORMULARY Bone up 3 capsules 2x per day    [provider]  tamsulosin (FLOMAX) 0.4 MG CAPS capsule Take 1 capsule (0.4 mg total) by mouth 2 (two)  times daily. appt further refills 04/01/20   McLean-Scocuzza, Nino Glow, MD     Allergies Patient has no known allergies.   Family History  Problem Relation Age of Onset   Hearing loss Father    Other Father        brain tumor   Parkinson's disease Father    Arthritis Father    Cancer Mother        pancreatic    Diabetes Sister    Arthritis Son    Stroke Maternal Grandfather    Stroke Paternal Grandmother    Stroke Paternal Grandfather    Diabetes Sister    Hyperlipidemia Sister    Hypertension Sister    Asthma Daughter    Hypertension Daughter    Asthma Son    Prostate cancer Neg Hx    Bladder Cancer Neg Hx    Kidney cancer Neg Hx     Social History Social History   Tobacco Use   Smoking status: Never   Smokeless tobacco: Never  Vaping Use   Vaping Use: Never used  Substance Use Topics   Alcohol use: No   Drug use: No    Review of Systems  Constitutional:   No fever positive chills.  ENT:   No sore throat. No rhinorrhea. Cardiovascular:   No chest pain or syncope. Respiratory:   No dyspnea or cough. Gastrointestinal:   Positive abdominal pain as above without vomiting and diarrhea.  Musculoskeletal:   Negative for focal pain or swelling All other systems reviewed and are negative except as documented above in ROS and HPI.  ____________________________________________   PHYSICAL EXAM:  VITAL SIGNS: ED Triage Vitals  Enc Vitals Group     BP 06/12/21 1149 97/65     Pulse Rate 06/12/21 1149 75     Resp 06/12/21 1149 16     Temp 06/12/21 1149 98.3 F (36.8 C)     Temp Source 06/12/21 1149 Oral     SpO2 06/12/21 1149 95 %     Weight 06/12/21 1147 190 lb 0.6 oz (86.2 kg)     Height 06/12/21 1147 5\' 9"  (1.753 m)     Head Circumference --      Peak Flow --      Pain Score 06/12/21 1146 0     Pain Loc --      Pain Edu? --      Excl. in [place]? --     Vital signs reviewed, nursing assessments reviewed.   Constitutional:   Alert and oriented.  Non-toxic appearance. Eyes:   Conjunctivae are normal. EOMI. PERRL. ENT      Head:   Normocephalic and atraumatic.      Nose:   Normal  Mouth/Throat:   Dry mucous membranes.      Neck:   No meningismus. Full ROM. Hematological/Lymphatic/Immunilogical:   No cervical lymphadenopathy. Cardiovascular:   RRR. Symmetric bilateral radial and DP pulses.  No murmurs. Cap refill less than 2 seconds. Respiratory:   Normal respiratory effort without tachypnea/retractions. Breath sounds are clear and equal bilaterally. No wheezes/rales/rhonchi. Gastrointestinal:   Soft with right upper quadrant tenderness. Non distended. There is no CVA tenderness.  No rebound, rigidity, or guarding.  Abdominal wall catheter insertion sites are well-healed and not inflamed Genitourinary:   deferred Musculoskeletal:   Normal range of motion in all extremities. No joint effusions.  No lower extremity tenderness.  No edema. Neurologic:   Normal speech and language.  Motor grossly intact. No acute focal neurologic deficits are appreciated.  Skin:    Skin is warm, dry and intact. No rash noted.  No petechiae, purpura, or bullae.  ____________________________________________    LABS (pertinent positives/negatives) (all labs ordered are listed, but only abnormal results are displayed) Labs Reviewed  URINALYSIS, ROUTINE W REFLEX MICROSCOPIC - Abnormal; Notable for the following components:      Result Value   Color, Urine YELLOW (*)    APPearance HAZY (*)    Protein, ur 30 (*)    All other components within normal limits  RESP PANEL BY RT-PCR (FLU A&B, COVID) ARPGX2  CULTURE, BLOOD (SINGLE)  LACTIC ACID, PLASMA   ____________________________________________   EKG    ____________________________________________    RADIOLOGY  DG Abdomen Acute W/Chest  Result Date: 06/12/2021 CLINICAL DATA:  Right upper quadrant pain for 2 days EXAM: DG ABDOMEN ACUTE WITH 1 VIEW CHEST COMPARISON:  None. FINDINGS: Cardiac  shadow is within normal limits. Aortic calcifications are noted. Lungs are well aerated bilaterally. Calcified granuloma is noted in the left mid. This is stable from the prior exam. Mild degenerative changes of the thoracic spine are noted. Scattered large and small bowel gas is noted. Mild retained fecal material is seen. No free air is noted. No obstructive changes are seen. No abnormal mass or abnormal calcifications are noted. Mild degenerative changes of the thoracolumbar spine are seen. IMPRESSION: Mild retained fecal material consistent with early constipation. No other focal abnormality is noted. Electronically Signed   By: Inez Catalina M.D.   On: 06/12/2021 09:58   US ABDOMEN LIMITED RUQ (LIVER/GB)  Result Date: 06/12/2021 CLINICAL DATA:  Acute right upper quadrant abdominal pain. EXAM: ULTRASOUND ABDOMEN LIMITED RIGHT UPPER QUADRANT COMPARISON:  None. FINDINGS: Gallbladder: Cholelithiasis is noted with mild gallbladder wall thickening. Positive sonographic Murphy's sign is noted. There is a complex fluid collection adjacent to the gallbladder which measures 4.7 x 3.8 x 2.9 cm and is concerning for possible abscess. Common bile duct: Diameter: 3 mm which is within normal limits. Liver: No focal lesion identified. Increased echogenicity of hepatic parenchyma is noted suggesting hepatic steatosis. Portal vein is patent on color Doppler imaging with normal direction of blood flow towards the liver. Other: None. IMPRESSION: Cholelithiasis is noted with mild gallbladder wall thickening and positive sonographic Murphy's sign concerning for cholecystitis. There is also noted 4.7 x 3.8 x 2.9 cm complex fluid collection adjacent to the gallbladder concerning for possible abscess. CT scan is recommended for further evaluation. Probable hepatic steatosis. Electronically Signed   By: Marijo Conception M.D.   On: 06/12/2021 13:54     ____________________________________________   PROCEDURES Procedures  ____________________________________________    CLINICAL IMPRESSION / ASSESSMENT AND PLAN / ED COURSE  Medications  ordered in the ED: Medications  piperacillin-tazobactam (ZOSYN) IVPB 3.375 g (3.375 g Intravenous New Bag/Given 06/12/21 1451)  iohexol (OMNIPAQUE) 300 MG/ML solution 100 mL (100 mLs Intravenous Contrast Given 06/12/21 1427)  sodium chloride 0.9 % bolus 1,000 mL (1,000 mLs Intravenous New Bag/Given 06/12/21 1451)    Pertinent labs & imaging results that were available during my care of the patient were reviewed by me and considered in my medical decision making (see chart for details).  Shawn Khan was evaluated in Emergency Department on 06/12/2021 for the symptoms described in the history of present illness. He was evaluated in the context of the global COVID-19 pandemic, which necessitated consideration that the patient might be at risk for infection with the SARS-CoV-2 virus that causes COVID-19. Institutional protocols and algorithms that pertain to the evaluation of patients at risk for COVID-19 are in a state of rapid change based on information released by regulatory bodies including the CDC and federal and state organizations. These policies and algorithms were followed during the patient's care in the ED.   Patient presents with recurrent right upper quadrant pain similar to prior episode of cholecystitis.  Labs performed at urgent care this morning show leukocytosis of 18,000 with left shift.  LFTs are normal, lipase is normal.  In the ED, lactic acid and COVID test is negative.  Vital signs are normal.  Ultrasound consistent with cholecystitis and shows a recurrent intra-abdominal abscess.  Radiology recommends follow-up CT scan which I have ordered.  Case discussed with surgery who recommends similar management to previous episode with percutaneous drainage, IV antibiotics, avoiding  surgery due to risk of general anesthesia.  I have ordered Zosyn.  Patient and spouse updated on findings.      ____________________________________________   FINAL CLINICAL IMPRESSION(S) / ED DIAGNOSES    Final diagnoses:  Cholecystitis  Intra-abdominal abscess [facility])     ED Discharge Orders     None       Portions of this note were generated with dragon dictation software. Dictation errors may occur despite best attempts at proofreading.   Carrie Mew, MD 06/12/21 1515

## 2021-06-12 NOTE — ED Notes (Signed)
Wife at bedside.

## 2021-06-12 NOTE — ED Triage Notes (Signed)
Referred to ED from [facility] for evaluation for possible gall bladder infection.

## 2021-06-12 NOTE — H&P (Addendum)
Chief Complaint: Patient was seen in consultation today for image guided percutaneous cholecystostomy catheter placement and a drain placement of perihepatic abscess Chief Complaint  Patient presents with   Abdominal Pain   at the request of Dr. Joni Fears, P.   Referring Physician(s): Dr. Joni Fears, P.   Supervising Physician: Aletta Edouard  Patient Status: [facility] - ED  History of Present Illness: Shawn Khan is a 80 y.o. male with PMHs listed below who presented to PCP due to RUQ pain, lab revealed leukocytosis, sent to  [facility] ED and underwent US and CT, found to have cholecystitis and perihepatic abscess.  Patient states that he had a similar episode and underwent perc choley and abscess drain placement in Feb 2022 at [facility].  IR was requested for image guided perc choley catheter placement and perihepatic abscess drain placement.   Patient laying in bed, not in acute distress, his wife at the bedside. Reports RUQ pain and abdominal bloating. Reports chills this morning, wife took temperature and he was not febrile. No chills currently. Denise headache, fever, shortness of breath, cough, chest pain, nausea ,vomiting, and bleeding.   Past Medical History:  Diagnosis Date   Arthritis    Chronic hoarseness    Complication of anesthesia    h/o nasal intubation x 1 hoarseness x 1 week   Contusion of chest    Cough    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   Gastritis    GERD (gastroesophageal reflux disease)    Hemorrhagic stroke ([place])    more than 5 years ago   History of chicken pox    Hyperlipidemia    did not like crestor    Hypertension    Hypoglycemia    Hypogonadism in male    Insomnia    Low testosterone in male    Osteoporosis    Osteoporosis    Prediabetes    Prediabetes    A1C 5.9 06/25/18    Rotator cuff disorder    Sleep apnea    cpap   Stroke [facility])    Vocal cord dysfunction    in 2018 damage per pt    Vocal cord nodule    Zenker diverticulum    1.7 cm     Zenker diverticulum     Past Surgical History:  Procedure Laterality Date   APPENDECTOMY     1955   EYE SURGERY     b/l cataract   FEMUR FRACTURE SURGERY     FRACTURE SURGERY     LARYNGOSCOPY  09/17/2017   Procedure: LARYNGOSCOPY;  Surgeon: Beverly Gust, MD;  Location: [facility] ORS;  Service: ENT;;   PENILE BIOPSY     08/6788 ulcer with lichenoid inflammation    rcr Bilateral    SHOULDER SURGERY     x3 right and left    THROAT SURGERY     vocal cord nodule   TONSILLECTOMY     TONSILLECTOMY     1948    Allergies: Patient has no known allergies.  Medications: Prior to Admission medications   Medication Sig Start Date End Date Taking? Authorizing Provider  clobetasol ointment (TEMOVATE) 0.05 % APPLY DAILY TO AFFECTED AREA UP TO 2 WEEKS. 02/23/19   [provider]  NON FORMULARY Bone up 3 capsules 2x per day    [provider]  tamsulosin (FLOMAX) 0.4 MG CAPS capsule Take 1 capsule (0.4 mg total) by mouth 2 (two) times daily. appt further refills 04/01/20   McLean-Scocuzza, Nino Glow, MD  Family History  Problem Relation Age of Onset   Hearing loss Father    Other Father        brain tumor   Parkinson's disease Father    Arthritis Father    Cancer Mother        pancreatic    Diabetes Sister    Arthritis Son    Stroke Maternal Grandfather    Stroke Paternal Grandmother    Stroke Paternal Grandfather    Diabetes Sister    Hyperlipidemia Sister    Hypertension Sister    Asthma Daughter    Hypertension Daughter    Asthma Son    Prostate cancer Neg Hx    Bladder Cancer Neg Hx    Kidney cancer Neg Hx     Social History   Socioeconomic History   Marital status: Married    Spouse name: Not on file   Number of children: Not on file   Years of education: Not on file   Highest education level: Not on file  Occupational History   Not on file  Tobacco Use   Smoking status: Never   Smokeless tobacco: Never  Vaping Use   Vaping Use: Never used   Substance and Sexual Activity   Alcohol use: No   Drug use: No   Sexual activity: Not Currently  Other Topics Concern   Not on file  Social History Narrative   Never smoker    Married    Engineer, drilling work    Wears Oncologist in relationship    12 grade ed    5 kids (3 daughters, 2 sons)   49 grand kids   From [place] lives between [place] and [place] for now    Social Determinants of Radio broadcast assistant Strain: Not on [employer] Insecurity: Not on file  Transportation Needs: Not on file  Physical Activity: Not on file  Stress: Not on file  Social Connections: Not on file     Review of Systems: A 12 point ROS discussed and pertinent positives are indicated in the HPI above.  All other systems are negative.   Vital Signs: BP 96/77   Pulse (!) 57   Temp 98.3 F (36.8 C) (Oral)   Resp 16   Ht 5\' 9"  (1.753 m)   Wt 190 lb 0.6 oz (86.2 kg)   SpO2 95%   BMI 28.06 kg/m   Physical Exam Vitals reviewed.  Constitutional:      General: He is not in acute distress.    Appearance: He is well-developed. He is not ill-appearing.  HENT:     Head: Normocephalic and atraumatic.  Cardiovascular:     Rate and Rhythm: Normal rate and regular rhythm.     Heart sounds: Normal heart sounds.  Pulmonary:     Effort: Pulmonary effort is normal.     Breath sounds: Normal breath sounds.  Abdominal:     General: Abdomen is flat. Bowel sounds are normal. There is distension.     Palpations: Abdomen is soft.  Skin:    General: Skin is warm and dry.     Coloration: Skin is not jaundiced.  Neurological:     Mental Status: He is alert and oriented to person, place, and time.  Psychiatric:        Mood and Affect: Mood normal.        Behavior: Behavior normal.    MD Evaluation Airway: WNL Heart: WNL Abdomen:  WNL Chest/ Lungs: WNL ASA  Classification: 2 Mallampati/Airway Score: Two  Imaging: CT ABDOMEN PELVIS W CONTRAST  Result Date:  06/12/2021 CLINICAL DATA:  Right upper quadrant pain with nausea EXAM: CT ABDOMEN AND PELVIS WITH CONTRAST TECHNIQUE: Multidetector CT imaging of the abdomen and pelvis was performed using the standard protocol following bolus administration of intravenous contrast. CONTRAST:  160mL OMNIPAQUE IOHEXOL 300 MG/ML  SOLN COMPARISON:  Ultrasound 06/12/2021, radiograph 06/12/2021, CT 03/04/2018 FINDINGS: Lower chest: Lung bases demonstrate no acute consolidation or effusion. Normal cardiac size. Hepatobiliary: Hepatic steatosis. Hyperdensity at the junction of right and left hepatic lobe, likely due to altered perfusion. Multiple calcified gallstones. Mild gallbladder wall thickening with indistinct appearance, likely corresponding to suspected cholecystitis. No biliary dilatation. Multi loculated collection measuring 4.6 x 2.2 by 2 cm adjacent to the gallbladder fundus, corresponding to ultrasound demonstrated abnormality. Pancreas: Unremarkable. No pancreatic ductal dilatation or surrounding inflammatory changes. Spleen: Normal in size without focal abnormality. Adrenals/Urinary Tract: Adrenal glands are normal. Multiple renal cysts. No hydronephrosis. The bladder is normal. Stomach/Bowel: Stomach is within normal limits. History of appendectomy. No evidence of bowel wall thickening, distention, or inflammatory changes. Left colon diverticular disease without acute wall thickening. Vascular/Lymphatic: Moderate aortic atherosclerosis without aneurysm. No suspicious nodes. Reproductive: Prostate is unremarkable. Other: Negative for free air or free fluid. Small fat containing umbilical hernia. Musculoskeletal: No acute or significant osseous findings. IMPRESSION: 1. Multiple gallstones with mild gallbladder wall thickening and surrounding inflammatory change, likely corresponding to suspected cholecystitis. 4.6 by 2.2 x 2 cm multiloculated collection adjacent to the gallbladder fundus at the junction of the right and left  hepatic lobes suspected to represent small abscess versus infected biloma. 2. Hepatic steatosis 3. Colon diverticular disease without acute inflammatory process Electronically Signed   By: Donavan Foil M.D.   On: 06/12/2021 15:23   DG Abdomen Acute W/Chest  Result Date: 06/12/2021 CLINICAL DATA:  Right upper quadrant pain for 2 days EXAM: DG ABDOMEN ACUTE WITH 1 VIEW CHEST COMPARISON:  None. FINDINGS: Cardiac shadow is within normal limits. Aortic calcifications are noted. Lungs are well aerated bilaterally. Calcified granuloma is noted in the left mid. This is stable from the prior exam. Mild degenerative changes of the thoracic spine are noted. Scattered large and small bowel gas is noted. Mild retained fecal material is seen. No free air is noted. No obstructive changes are seen. No abnormal mass or abnormal calcifications are noted. Mild degenerative changes of the thoracolumbar spine are seen. IMPRESSION: Mild retained fecal material consistent with early constipation. No other focal abnormality is noted. Electronically Signed   By: Inez Catalina M.D.   On: 06/12/2021 09:58   US ABDOMEN LIMITED RUQ (LIVER/GB)  Result Date: 06/12/2021 CLINICAL DATA:  Acute right upper quadrant abdominal pain. EXAM: ULTRASOUND ABDOMEN LIMITED RIGHT UPPER QUADRANT COMPARISON:  None. FINDINGS: Gallbladder: Cholelithiasis is noted with mild gallbladder wall thickening. Positive sonographic Murphy's sign is noted. There is a complex fluid collection adjacent to the gallbladder which measures 4.7 x 3.8 x 2.9 cm and is concerning for possible abscess. Common bile duct: Diameter: 3 mm which is within normal limits. Liver: No focal lesion identified. Increased echogenicity of hepatic parenchyma is noted suggesting hepatic steatosis. Portal vein is patent on color Doppler imaging with normal direction of blood flow towards the liver. Other: None. IMPRESSION: Cholelithiasis is noted with mild gallbladder wall thickening and  positive sonographic Murphy's sign concerning for cholecystitis. There is also noted 4.7 x 3.8 x 2.9 cm complex fluid  collection adjacent to the gallbladder concerning for possible abscess. CT scan is recommended for further evaluation. Probable hepatic steatosis. Electronically Signed   By: Marijo Conception M.D.   On: 06/12/2021 13:54    Labs:  CBC: Recent Labs    06/12/21 0915  WBC 18.1*  HGB 14.6  HCT 42.2  PLT 222    COAGS: No results for input(s): INR, APTT in the last 8760 hours.  BMP: Recent Labs    06/12/21 0915  NA 134*  K 4.1  CL 103  CO2 23  GLUCOSE 153*  BUN 17  CALCIUM 9.1  CREATININE 0.93  GFRNONAA >60    LIVER FUNCTION TESTS: Recent Labs    06/12/21 0915  BILITOT 0.7  AST 23  ALT 20  ALKPHOS 69  PROT 8.3*  ALBUMIN 3.8    TUMOR MARKERS: No results for input(s): AFPTM, CEA, CA199, CHROMGRNA in the last 8760 hours.  Assessment and Plan: 80 y.o. male with recurrent cholecystitis and perihepatic abscess s/p perc choley and perihepatic abscess drain placement in Feb 2022 at [facility]. The drains were removed eventually, patient had no follow up with general surgery to discuss possible cholecystectomy. Patient presented to PCP today due to RUQ pain, lab revealed leukocytosis and was sent to [facility] ED  for further evaluation. Patient underwent US and CT and was found to have cholecystitis and perihepatic abscess.   IR was requested for image guided perc choley catheter placement and perihepatic abscess drain placement.  Case was reviewed and approved by Dr. Kathlene Cote for image guided percutaneous cholecystostomy catheter placement and possible aspiration and/or drain placement of the perihepatic abscess.  Procedure is tentatively scheduled for tomorrow pending IR schedule.  NPO at midnight  Cefoxitin ordered INR ordered VSS Not on anticoag/plt due to hx hemorraghic stroke 15-20 years ago  CBC WBC 18.2, unremarkable otherwise  Risks and benefits discussed with  the patient including bleeding, infection, damage to adjacent structures, bowel perforation/fistula connection, and sepsis.  Risks and benefits discussed with the patient including, but not limited to bleeding, infection, gallbladder perforation, bile leak, sepsis or even death.  All of the patient's questions were answered, patient is agreeable to proceed. Consent signed and in Short Stay     Thank you for this interesting consult.  I greatly enjoyed meeting JAMEEL QUANT and look forward to participating in their care.  A copy of this report was sent to the requesting provider on this date.  Electronically Signed: Tera Mater, PA-C 06/12/2021, 4:05 PM   I spent a total of 40 Minutes    in face to face in clinical consultation, greater than 50% of which was counseling/coordinating care for image guided percutaneous cholecystostomy catheter placement and possible aspiration and/or drain placement of the perihepatic abscess.

## 2021-06-12 NOTE — Discharge Instructions (Addendum)
Please go to the emergency department at [facility] for evaluation of your right upper quadrant pain and advanced imaging to evaluate for an infection of your gallbladder.

## 2021-06-12 NOTE — Consult Note (Addendum)
[facility] SUR[facility] CON[facility]ULTATION NOTE (initial) - cpt: 50539   HISTORY OF PRESENT ILLNESS (HPI):  80 y.o. male presented to [facility] ED today for evaluation of abdominal pain.  Patient reports around a 3-4 day history of abdominal pain which he reports has mild in nature and came and went intermittently. Only associated symptom was bloating. Nothing specific seemed to cause onset of this pain. No fever, chills, cough, CP, SOB, nausea, emesis, or bowel changes. Patient with a known history of acute cholecystitis, last in February of 2022, which was managed at [facility] secondary to concerns for possible ascending cholangitis. Ultimately, this was managed with percutaneous cholecystostomy tube placed at 02/21. Cholecystostomy tube was ultimately removed on 03/28 after drain injection showed patent duct. Due to significant medical comorbid conditions, [facility] surgery elected to not perform cholecystectomy. Work up in the ED revealed a leukocytosis to 18.1K but remaining laboratory work up was reassuring. RUQ [place] and CT Abdomen/Pelvis were concerning for cholelithiasis, cholecystitis, and abscess.   Surgery is consulted by emergency medicine physician Dr. Carrie Mew, MD in this context for evaluation and management of recurrent acute cholecystitis.    PAST MEDICAL HISTORY (PMH):  Past Medical History:  Diagnosis Date   Arthritis    Chronic hoarseness    Complication of anesthesia    h/o nasal intubation x 1 hoarseness x 1 week   Contusion of chest    Cough    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   Gastritis    GERD (gastroesophageal reflux disease)    Hemorrhagic stroke ([place])    more than 5 years ago   History of chicken pox    Hyperlipidemia    did not like crestor    Hypertension    Hypoglycemia    Hypogonadism in male    Insomnia    Low testosterone in male    Osteoporosis    Osteoporosis    Prediabetes    Prediabetes    A1C 5.9 06/25/18    Rotator cuff disorder    Sleep apnea     cpap   Stroke [facility])    Vocal cord dysfunction    in 2018 damage per pt    Vocal cord nodule    Zenker diverticulum    1.7 cm    Zenker diverticulum      PAST SURGICAL HISTORY ([place]):  Past Surgical History:  Procedure Laterality Date   APPENDECTOMY     1955   EYE SURGERY     b/l cataract   FEMUR FRACTURE SURGERY     FRACTURE SURGERY     LARYNGOSCOPY  09/17/2017   Procedure: LARYNGOSCOPY;  Surgeon: Beverly Gust, MD;  Location: [facility] ORS;  Service: ENT;;   PENILE BIOPSY     06/6733 ulcer with lichenoid inflammation    rcr Bilateral    SHOULDER SURGERY     x3 right and left    THROAT SURGERY     vocal cord nodule   TONSILLECTOMY     TONSILLECTOMY     1948     MEDICATIONS:  Prior to Admission medications   Medication Sig Start Date End Date Taking? Authorizing Provider  clobetasol ointment (TEMOVATE) 0.05 % APPLY DAILY TO AFFECTED AREA UP TO 2 WEEKS. 02/23/19   [provider]  NON FORMULARY Bone up 3 capsules 2x per day    [provider]  tamsulosin (FLOMAX) 0.4 MG CAPS capsule Take 1 capsule (0.4 mg total) by mouth 2 (two) times daily. appt further refills 04/01/20  McLean-Scocuzza, Nino Glow, MD     ALLERGIES:  No Known Allergies   SOCIAL HISTORY:  Social History   Socioeconomic History   Marital status: Married    Spouse name: Not on file   Number of children: Not on file   Years of education: Not on file   Highest education level: Not on file  Occupational History   Not on file  Tobacco Use   Smoking status: Never   Smokeless tobacco: Never  Vaping Use   Vaping Use: Never used  Substance and Sexual Activity   Alcohol use: No   Drug use: No   Sexual activity: Not Currently  Other Topics Concern   Not on file  Social History Narrative   Never smoker    Married    Engineer, drilling work    Wears Oncologist in relationship    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    5 kids (3 daughters, 2 sons)   108 grand kids   From [place] lives between  [place] and [place] for now    Social Determinants of Radio broadcast assistant Strain: Not on [employer] Insecurity: Not on file  Transportation Needs: Not on file  Physical Activity: Not on file  Stress: Not on file  Social Connections: Not on file  Intimate Partner Violence: Not on file     FAMILY HISTORY:  Family History  Problem Relation Age of Onset   Hearing loss Father    Other Father        brain tumor   Parkinson's disease Father    Arthritis Father    Cancer Mother        pancreatic    Diabetes Sister    Arthritis Son    Stroke Maternal Grandfather    Stroke Paternal Grandmother    Stroke Paternal Grandfather    Diabetes Sister    Hyperlipidemia Sister    Hypertension Sister    Asthma Daughter    Hypertension Daughter    Asthma Son    Prostate cancer Neg Hx    Bladder Cancer Neg Hx    Kidney cancer Neg Hx       REVIEW OF SYSTEMS:  Review of Systems  Constitutional:  Negative for chills and fever.  Respiratory:  Negative for cough and shortness of breath.   Cardiovascular:  Negative for chest pain and palpitations.  Gastrointestinal:  Positive for abdominal pain. Negative for constipation, diarrhea, nausea and vomiting.  Genitourinary:  Negative for dysuria and urgency.  All other systems reviewed and are negative.  VITAL SIGNS:  Temp:  [98.3 F (36.8 C)-99 F (37.2 C)] 98.3 F (36.8 C) (06/27 1149) Pulse Rate:  [57-105] 57 (06/27 1400) Resp:  [16] 16 (06/27 1149) BP: (96-106)/(65-87) 96/77 (06/27 1400) SpO2:  [94 %-96 %] 95 % (06/27 1400) Weight:  [86.2 kg] 86.2 kg (06/27 1147)     Height: 5\' 9"  (175.3 cm) Weight: 86.2 kg BMI (Calculated): 28.05   INTAKE/OUTPUT:  No intake/output data recorded.  PHYSICAL EXAM:  Physical Exam Vitals and nursing note reviewed. Exam conducted with a chaperone present.  Constitutional:      General: He is not in acute distress.    Appearance: He is well-developed. He is obese. He is not ill-appearing.  HENT:      Head: Normocephalic and atraumatic.  Eyes:     General: No scleral icterus.    Extraocular Movements: Extraocular movements intact.  Cardiovascular:  Rate and Rhythm: Normal rate and regular rhythm.     Heart sounds: Normal heart sounds.  Pulmonary:     Effort: Pulmonary effort is normal. No respiratory distress.  Abdominal:     General: Abdomen is protuberant. There is distension (Mild).     Palpations: Abdomen is soft.     Tenderness: There is abdominal tenderness (Mild) in the right upper quadrant. There is no guarding or rebound.     Hernia: A hernia is present. Hernia is present in the umbilical area.     Comments: Abdomen is protuberant, soft, mild tenderness with deep palpation the RUQ, no rebound/guarding. He also has a small reducible umbilical hernia   Genitourinary:    Comments: Deferred Skin:    General: Skin is warm and dry.     Coloration: Skin is not jaundiced or pale.  Neurological:     General: No focal deficit present.     Mental Status: He is alert and oriented to person, place, and time.  Psychiatric:        Mood and Affect: Mood normal.        Behavior: Behavior normal.     Labs:  CBC Latest Ref Rng & Units 06/12/2021 12/19/2018 09/11/2017  WBC 4.0 - 10.5 K/uL 18.1(H) 5.8 6.7  Hemoglobin 13.0 - 17.0 g/dL 14.6 14.8 14.4  Hematocrit 39.0 - 52.0 % 42.2 44.4 42.4  Platelets 150 - 400 K/uL 222 211.0 191   CMP Latest Ref Rng & Units 06/12/2021 12/19/2018 03/04/2018  Glucose 70 - 99 mg/dL 153(H) 105(H) -  BUN 8 - 23 mg/dL 17 20 -  Creatinine 0.61 - 1.24 mg/dL 0.93 0.83 0.90  Sodium 135 - 145 mmol/L 134(L) 140 -  Potassium 3.5 - 5.1 mmol/L 4.1 4.3 -  Chloride 98 - 111 mmol/L 103 103 -  CO2 22 - 32 mmol/L 23 29 -  Calcium 8.9 - 10.3 mg/dL 9.1 9.5 -  Total Protein 6.5 - 8.1 g/dL 8.3(H) 6.8 -  Total Bilirubin 0.3 - 1.2 mg/dL 0.7 0.5 -  Alkaline Phos 38 - 126 U/L 69 42 -  AST 15 - 41 U/L 23 18 -  ALT 0 - 44 U/L 20 17 -     Imaging studies:   RUQ [place]  (06/12/2021) personally reviewed showing cholelithiasis and possible adjacent abscess, and radiologist report reviewed:  IMPRESSION: Cholelithiasis is noted with mild gallbladder wall thickening and positive sonographic Murphy's sign concerning for cholecystitis. There is also noted 4.7 x 3.8 x 2.9 cm complex fluid collection adjacent to the gallbladder concerning for possible abscess. CT scan is recommended for further evaluation.  CT Abdomen/Pelvis (06/12/2021) personally reviewed showing numerous gallstones, inflammatory changes surrounding the gallbladder, and likely abscess, and radiologist report reviewed below:  IMPRESSION: 1. Multiple gallstones with mild gallbladder wall thickening and surrounding inflammatory change, likely corresponding to suspected cholecystitis. 4.6 by 2.2 x 2 cm multiloculated collection adjacent to the gallbladder fundus at the junction of the right and left hepatic lobes suspected to represent small abscess versus infected biloma. 2. Hepatic steatosis 3. Colon diverticular disease without acute inflammatory process   Assessment/Plan: (ICD-10's: K81.0) 80 y.o. male with recurrent acute cholecystitis with abscess, complicated by numerous pertinent comorbidities.   - Appreciate medicine admission - Agree with IR consultation for percutaneous cholecystostomy tube and abscess drainage; PA at bedside; plan for tomorrow (06/28)  - Okay for full liquids tonight; NPO at midnight   - IV Abx (Zosyn) - Monitor abdominal examination - Pain control prn; antiemetics  prn - monitor leukocytosis    - DVT prophylaxis; hold in anticipation of IR procedure  All of the above findings and recommendations were discussed with the patient and his wife, and all of their questions were answered to their expressed satisfaction.  Thank you for the opportunity to participate in this patient's care.   -- Edison Simon, PA-C [facility] 06/12/2021, 3:29  PM [phone] M-F: 7am - 4pm  I agree with the above documentation, exam, and plan, which I have edited where appropriate. Fredirick Maudlin  12:29 PM

## 2021-06-12 NOTE — ED Provider Notes (Signed)
[facility]    CSN: [id number] Arrival date & time: 06/12/21  0816      History   Chief Complaint Chief Complaint  Patient presents with   Flank Pain    Right side     HPI Shawn Khan is a 80 y.o. male.   HPI  80 year old male here for evaluation of right upper quadrant pain.  Patient is here with his wife who reports that he developed right upper quadrant pain that is worse with eating and causes nausea yesterday.  Last night he had some shaking chills and he had them again this morning.  He states that he has had bloating since he was discharged from the hospital on February 07, 2021.  He was admitted for cholecystitis and had a percutaneous drain of his gallbladder.  He has not had a fever but has had an elevated temp.  He has not eaten today.  Patient also reports that he has not had a bowel movement in 2 days and is bloating has gotten worse.  As far as water consumption goes he says he has 2 glasses a day, 1 cup of coffee and then various fluids throughout the day.  Past Medical History:  Diagnosis Date   Arthritis    Chronic hoarseness    Complication of anesthesia    h/o nasal intubation x 1 hoarseness x 1 week   Contusion of chest    Cough    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   Gastritis    GERD (gastroesophageal reflux disease)    Hemorrhagic stroke ([place])    more than 5 years ago   History of chicken pox    Hyperlipidemia    did not like crestor    Hypertension    Hypoglycemia    Hypogonadism in male    Insomnia    Low testosterone in male    Osteoporosis    Osteoporosis    Prediabetes    Prediabetes    A1C 5.9 06/25/18    Rotator cuff disorder    Sleep apnea    cpap   Stroke ([place])    Vocal cord dysfunction    in 2018 damage per pt    Vocal cord nodule    Zenker diverticulum    1.7 cm    Zenker diverticulum     Patient Active Problem List   Diagnosis Date Noted   Aortic atherosclerosis ([place]) 03/02/2019   Bradycardia 12/18/2018    Leg cramps 12/18/2018   Prediabetes 10/03/2018   Barrett's esophagus 10/03/2018   Penile ulcer 09/16/2018   Zenker's diverticulum 09/16/2018   Degenerative joint disease involving multiple joints on both sides of body 04/23/2018   OSA (obstructive sleep apnea) 04/23/2018   Urinary urgency 04/23/2018   Seborrheic keratosis 04/23/2018   Microscopic hematuria 11/20/2017   Acquired phimosis 11/20/2017   Benign essential hypertension 11/20/2017   Benign prostatic hyperplasia with urinary obstruction 11/20/2017   Chronic hoarseness 11/20/2017   Contusion of chest 11/20/2017   Disorder of bone and articular cartilage 11/20/2017   Disorder of rotator cuff 11/20/2017   Dysphagia 11/20/2017   Gastroesophageal reflux disease 11/20/2017   Generalized osteoarthritis 11/20/2017   History of stroke without residual deficits 11/20/2017   Hypoxemia 11/20/2017   Impaired fasting glucose 11/20/2017   Increased frequency of urination 11/20/2017   Insomnia 11/20/2017   Backache 11/20/2017   Mixed hyperlipidemia 11/20/2017   Obstructive sleep apnea syndrome 11/20/2017   Osteoporosis 11/20/2017   Urinary urgency 11/20/2017  Shoulder joint painful on movement 11/20/2017   Seborrheic keratosis 11/20/2017   Rib pain 11/20/2017   Pharyngitis 11/20/2017   Male hypogonadism 11/20/2017    Past Surgical History:  Procedure Laterality Date   APPENDECTOMY     1955   EYE SURGERY     b/l cataract   FEMUR FRACTURE SURGERY     FRACTURE SURGERY     LARYNGOSCOPY  09/17/2017   Procedure: LARYNGOSCOPY;  Surgeon: Beverly Gust, MD;  Location: [facility];  Service: ENT;;   PENILE BIOPSY     04/276 ulcer with lichenoid inflammation    rcr Bilateral    SHOULDER SURGERY     x3 right and left    THROAT SURGERY     vocal cord nodule   TONSILLECTOMY     TONSILLECTOMY     1948       Home Medications    Prior to Admission medications   Medication Sig Start Date End Date Taking? Authorizing Provider   clobetasol ointment (TEMOVATE) 0.05 % APPLY DAILY TO AFFECTED AREA UP TO 2 WEEKS. 02/23/19   [provider]  NON FORMULARY Bone up 3 capsules 2x per day    [provider]  tamsulosin (FLOMAX) 0.4 MG CAPS capsule Take 1 capsule (0.4 mg total) by mouth 2 (two) times daily. appt further refills 04/01/20   McLean-Scocuzza, Nino Glow, MD    Family History Family History  Problem Relation Age of Onset   Hearing loss Father    Other Father        brain tumor   Parkinson's disease Father    Arthritis Father    Cancer Mother        pancreatic    Diabetes Sister    Arthritis Son    Stroke Maternal Grandfather    Stroke Paternal Grandmother    Stroke Paternal Grandfather    Diabetes Sister    Hyperlipidemia Sister    Hypertension Sister    Asthma Daughter    Hypertension Daughter    Asthma Son    Prostate cancer Neg Hx    Bladder Cancer Neg Hx    Kidney cancer Neg Hx     Social History Social History   Tobacco Use   Smoking status: Never   Smokeless tobacco: Never  Vaping Use   Vaping Use: Never used  Substance Use Topics   Alcohol use: No   Drug use: No     Allergies   Patient has no known allergies.   Review of Systems Review of Systems  Constitutional:  Positive for appetite change, chills and fatigue. Negative for activity change and fever.  Gastrointestinal:  Positive for abdominal distention, abdominal pain, constipation and nausea. Negative for vomiting.  Musculoskeletal:  Negative for back pain.    Physical Exam Triage Vital Signs ED Triage Vitals [06/12/21 0825]  Enc Vitals Group     BP 95/71     Pulse Rate (!) 112     Resp 16     Temp      Temp Source Oral     SpO2 96 %     Weight      Height      Head Circumference      Peak Flow      Pain Score      Pain Loc      Pain Edu?      Excl. in [place]?    No data found.  Updated Vital Signs BP 106/73 (BP Location: Left Arm)  Pulse (!) 105   Temp 99 F (37.2 C) (Oral)   Resp 16    SpO2 96%   Visual Acuity Right Eye Distance:   Left Eye Distance:   Bilateral Distance:    Right Eye Near:   Left Eye Near:    Bilateral Near:     Physical Exam Vitals and nursing note reviewed.  Constitutional:      General: He is not in acute distress.    Appearance: Normal appearance. He is ill-appearing.  HENT:     Head: Normocephalic and atraumatic.  Cardiovascular:     Rate and Rhythm: Normal rate and regular rhythm.     Pulses: Normal pulses.     Heart sounds: Normal heart sounds. No murmur heard.   No gallop.  Pulmonary:     Effort: Pulmonary effort is normal.     Breath sounds: Normal breath sounds. No wheezing, rhonchi or rales.  Abdominal:     General: There is distension.     Palpations: Abdomen is soft.     Tenderness: There is abdominal tenderness. There is no guarding or rebound.     Hernia: A hernia is present.  Skin:    General: Skin is warm and dry.     Capillary Refill: Capillary refill takes less than 2 seconds.     Findings: No rash.  Neurological:     General: No focal deficit present.     Mental Status: He is alert and oriented to person, place, and time.  Psychiatric:        Mood and Affect: Mood normal.        Behavior: Behavior normal.        Thought Content: Thought content normal.        Judgment: Judgment normal.     UC Treatments / Results  Labs (all labs ordered are listed, but only abnormal results are displayed) Labs Reviewed  CBC WITH DIFFERENTIAL/PLATELET - Abnormal; Notable for the following components:      Result Value   WBC 18.1 (*)    Neutro Abs 15.6 (*)    Lymphs Abs 0.5 (*)    Monocytes Absolute 1.8 (*)    Abs Immature Granulocytes 0.11 (*)    All other components within normal limits  COMPREHENSIVE METABOLIC PANEL - Abnormal; Notable for the following components:   Sodium 134 (*)    Glucose, Bld 153 (*)    Total Protein 8.3 (*)    All other components within normal limits  URINALYSIS, COMPLETE (UACMP) WITH  MICROSCOPIC - Abnormal; Notable for the following components:   Hgb urine dipstick MODERATE (*)    Ketones, ur TRACE (*)    Protein, ur 30 (*)    All other components within normal limits  LIPASE, BLOOD  SEDIMENTATION RATE    EKG   Radiology DG Abdomen Acute W/Chest  Result Date: 06/12/2021 CLINICAL DATA:  Right upper quadrant pain for 2 days EXAM: DG ABDOMEN ACUTE WITH 1 VIEW CHEST COMPARISON:  None. FINDINGS: Cardiac shadow is within normal limits. Aortic calcifications are noted. Lungs are well aerated bilaterally. Calcified granuloma is noted in the left mid. This is stable from the prior exam. Mild degenerative changes of the thoracic spine are noted. Scattered large and small bowel gas is noted. Mild retained fecal material is seen. No free air is noted. No obstructive changes are seen. No abnormal mass or abnormal calcifications are noted. Mild degenerative changes of the thoracolumbar spine are seen. IMPRESSION: Mild retained fecal material consistent  with early constipation. No other focal abnormality is noted. Electronically Signed   By: Inez Catalina M.D.   On: 06/12/2021 09:58    Procedures Procedures (including critical care time)  Medications Ordered in UC Medications - No data to display  Initial Impression / Assessment and Plan / UC Course  I have reviewed the triage vital signs and the nursing notes.  Pertinent labs & imaging results that were available during my care of the patient were reviewed by me and considered in my medical decision making (see chart for details).  Patient is a very pleasant but ill-appearing 5-year-old male here for evaluation of right upper quadrant pain that started last night.  He recently had cholecystitis that was drained percutaneously without removal of the gallbladder in February of this year.  He states his pain is starting off the same way it did last time.  He is also had bloating and has not had a bowel movement in last 2 days.  He  reports that his bloating has been consistent since he was discharged in February.  Patient's physical exam reveals a benign cardiopulmonary exam.  Abdomen is protuberant with a large upper central ventral hernia that is soft and easily reducible.  Patient also has a smaller inferior umbilical hernia that is also soft and easily reducible.  Patient has tenderness to the right upper quadrant, left upper quadrant, and left lower quadrant.  Patient's bowel sounds are present and normal active.  Will check CBC, CMP, ESR, lipase, and UA.  We will also obtain three-way abdomen to evaluate for constipation versus obstruction.  Three-way abdominal films independently reviewed and evaluated by me.  Interpretation: There is scattered nonspecific air-fluid levels and stool throughout the ascending and transverse colon.  Awaiting radiology overread.  Radiology interpretation of x-ray is that there is retained stool within the colon showing mild constipation.  CBC shows an 18.1 white count with 15.6 neutrophils.  CMP shows mild hyponatremia with a sodium of 134, transaminases are normal.  BUN/creatinine unremarkable.  Patient's lipase is 39.  ESR is 14.  Urinalysis shows moderate hemoglobin, trace ketones, 30 protein, and is otherwise unremarkable.  Due to patient having elevated white blood cell count and no signs of obstruction on his abdomen films will refer patient to the emergency department for CT scan or ultrasound to evaluate his gallbladder.  I have spoken with the patient and the family, informed them of the laboratory and diagnostic imaging findings, and discussed the need for advanced imaging.  Patient vies that he does go to the emergency department and he and his family have elected to go to [facility] ER.   Final Clinical Impressions(s) / UC Diagnoses   Final diagnoses:  RUQ abdominal mass     Discharge Instructions      Please go to the emergency department at [facility] for evaluation of your  right upper quadrant pain and advanced imaging to evaluate for an infection of your gallbladder.     ED Prescriptions   None    PDMP not reviewed this encounter.   Margarette Canada, NP 06/12/21 1105

## 2021-06-12 NOTE — H&P (Signed)
History and Physical    Shawn Khan NGE:[id number] DOB: [birth year] DOA: 06/12/2021  PCP: Idelle Crouch, MD    Patient coming from:  home   Chief Complaint:  Abdominal pain.   HPI: Shawn Khan is a 80 y.o. male seen in ed with complaints of  abdominal pain and seen by gen surg for percutaneous drainage. Pt seen in ed with c/o RUQ pain worse since past two days.Pain is worse with eating ? NR/ NO N/V. And reports it feels like his prior episode of cholecystitis in feb 2022. Pt had percutaneous cholecystostomy in 2/22 with removal on 3/28. Pt has been having RUQ pain since Sunday.   Pt has past medical history of GERD, history of hemorrhagic stroke, hyperlipidemia, hypertension, osteoporosis, prediabetes, sleep apnea, vocal cord dysfunction, Barrett's esophagus, hypogonadism. ED Course:  Vitals:   06/12/21 1149 06/12/21 1300 06/12/21 1345 06/12/21 1400  BP: 97/65 99/87  96/77  Pulse: 75 75 60 (!) 57  Resp: 16     Temp: 98.3 F (36.8 C)     TempSrc: Oral     SpO2: 95% 96% 94% 95%  Weight:      Height:      In the emergency room patient is alert awake oriented, afebrile and blood pressure low . In the emergency room patient received Zosyn and 1 L normal saline bolus. Labs shows  BMP: 134 for Sodium, Glucose  153, normal kidney and liver function, CBC shows a white count of 18.1, neutrophil percentage 5.6, CT of the abdomen showed Multiple gallstones with mild gallbladder wall thickening and surrounding inflammatory change, likely corresponding to suspected cholecystitis. 4.6 by 2.2 x 2 cm multiloculated collection adjacent to the gallbladder fundus at the junction of the right and left hepatic lobes suspected to represent small Cholelithiasis is noted with mild gallbladder wall thickening and Ultrasound done today shows positive sonographic Murphy's sign concerning for cholecystitis. There is also noted 4.7 x 3.8 x 2.9 cm complex fluid collection adjacent to  the gallbladder concerning for possible abscess. CT scan is recommended for further evaluation.abscess versus infected biloma.  Patient seen by general surgery in the emergency room with recommendations for percutaneous cholecystostomy by IR.  Review of Systems:  Review of Systems  Constitutional:  Positive for chills, fever and malaise/fatigue.  HENT: Negative.    Respiratory: Negative.    Cardiovascular: Negative.   Gastrointestinal:  Positive for abdominal pain.  Genitourinary: Negative.   Musculoskeletal: Negative.   Skin: Negative.   Neurological: Negative.   Endo/Heme/Allergies: Negative.    Past Medical History:  Diagnosis Date   Arthritis    Chronic hoarseness    Complication of anesthesia    h/o nasal intubation x 1 hoarseness x 1 week   Contusion of chest    Cough    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   Gastritis    GERD (gastroesophageal reflux disease)    Hemorrhagic stroke ([place])    more than 5 years ago   History of chicken pox    Hyperlipidemia    did not like crestor    Hypertension    Hypoglycemia    Hypogonadism in male    Insomnia    Low testosterone in male    Osteoporosis    Osteoporosis    Prediabetes    Prediabetes    A1C 5.9 06/25/18    Rotator cuff disorder    Sleep apnea    cpap   Stroke [facility])    Vocal cord dysfunction  in 2018 damage per pt    Vocal cord nodule    Zenker diverticulum    1.7 cm    Zenker diverticulum     Past Surgical History:  Procedure Laterality Date   APPENDECTOMY     1955   EYE SURGERY     b/l cataract   FEMUR FRACTURE SURGERY     FRACTURE SURGERY     LARYNGOSCOPY  09/17/2017   Procedure: LARYNGOSCOPY;  Surgeon: Beverly Gust, MD;  Location: [facility] ORS;  Service: ENT;;   PENILE BIOPSY     0/2774 ulcer with lichenoid inflammation    rcr Bilateral    SHOULDER SURGERY     x3 right and left    THROAT SURGERY     vocal cord nodule   TONSILLECTOMY     TONSILLECTOMY     1948     reports that he has never smoked.  He has never used smokeless tobacco. He reports that he does not drink alcohol and does not use drugs.  No Known Allergies  Family History  Problem Relation Age of Onset   Hearing loss Father    Other Father        brain tumor   Parkinson's disease Father    Arthritis Father    Cancer Mother        pancreatic    Diabetes Sister    Arthritis Son    Stroke Maternal Grandfather    Stroke Paternal Grandmother    Stroke Paternal Grandfather    Diabetes Sister    Hyperlipidemia Sister    Hypertension Sister    Asthma Daughter    Hypertension Daughter    Asthma Son    Prostate cancer Neg Hx    Bladder Cancer Neg Hx    Kidney cancer Neg Hx     Prior to Admission medications   Medication Sig Start Date End Date Taking? Authorizing Provider  clobetasol ointment (TEMOVATE) 0.05 % APPLY DAILY TO AFFECTED AREA UP TO 2 WEEKS. 02/23/19   [provider]  NON FORMULARY Bone up 3 capsules 2x per day    [provider]  tamsulosin (FLOMAX) 0.4 MG CAPS capsule Take 1 capsule (0.4 mg total) by mouth 2 (two) times daily. appt further refills 04/01/20   McLean-Scocuzza, Nino Glow, MD    Physical Exam: Vitals:   06/12/21 1149 06/12/21 1300 06/12/21 1345 06/12/21 1400  BP: 97/65 99/87  96/77  Pulse: 75 75 60 (!) 57  Resp: 16     Temp: 98.3 F (36.8 C)     TempSrc: Oral     SpO2: 95% 96% 94% 95%  Weight:      Height:       Physical Exam Vitals and nursing note reviewed.  Constitutional:      Appearance: Normal appearance. He is well-developed.  HENT:     Head: Normocephalic and atraumatic.  Eyes:     Extraocular Movements: Extraocular movements intact.     Pupils: Pupils are equal, round, and reactive to light.  Cardiovascular:     Rate and Rhythm: Normal rate and regular rhythm.     Pulses: Normal pulses.     Heart sounds: Normal heart sounds.  Pulmonary:     Effort: Pulmonary effort is normal.     Breath sounds: Normal breath sounds.  Abdominal:     General:  Bowel sounds are absent. There is distension.     Palpations: Abdomen is soft. There is no mass.     Tenderness:  There is abdominal tenderness in the right upper quadrant and epigastric area. There is guarding and rebound.     Hernia: A hernia is present.  Skin:    General: Skin is warm.  Neurological:     General: No focal deficit present.     Mental Status: He is alert and oriented to person, place, and time.     Labs on Admission: I have personally reviewed following labs and imaging studies  No results for input(s): CKTOTAL, CKMB, TROPONINI in the last 72 hours. Lab Results  Component Value Date   WBC 18.1 (H) 06/12/2021   HGB 14.6 06/12/2021   HCT 42.2 06/12/2021   MCV 90.9 06/12/2021   PLT 222 06/12/2021    Recent Labs  Lab 06/12/21 0915  NA 134*  K 4.1  CL 103  CO2 23  BUN 17  CREATININE 0.93  CALCIUM 9.1  PROT 8.3*  BILITOT 0.7  ALKPHOS 69  ALT 20  AST 23  GLUCOSE 153*   Lab Results  Component Value Date   CHOL 232 (H) 12/19/2018   HDL 54.70 12/19/2018   LDLCALC 154 (H) 12/19/2018   TRIG 118.0 12/19/2018   No results found for: DDIMER Invalid input(s): POCBNP  Urinalysis    Component Value Date/Time   COLORURINE YELLOW (A) 06/12/2021 1220   APPEARANCEUR HAZY (A) 06/12/2021 1220   APPEARANCEUR Clear 03/19/2018 0902   LABSPEC 1.017 06/12/2021 1220   LABSPEC 1.020 09/22/2013 1408   PHURINE 5.0 06/12/2021 1220   GLUCOSEU NEGATIVE 06/12/2021 1220   GLUCOSEU Negative 09/22/2013 1408   HGBUR NEGATIVE 06/12/2021 1220   BILIRUBINUR NEGATIVE 06/12/2021 1220   BILIRUBINUR Negative 03/19/2018 0902   BILIRUBINUR Negative 09/22/2013 1408   KETONESUR NEGATIVE 06/12/2021 1220   PROTEINUR 30 (A) 06/12/2021 1220   NITRITE NEGATIVE 06/12/2021 1220   LEUKOCYTESUR NEGATIVE 06/12/2021 1220   LEUKOCYTESUR Negative 09/22/2013 1408    COVID-19 Labs No results for input(s): DDIMER, FERRITIN, LDH, CRP in the last 72 hours. Lab Results  Component Value Date    [place] NEGATIVE 06/12/2021    Radiological Exams on Admission: CT ABDOMEN PELVIS W CONTRAST  Result Date: 06/12/2021 CLINICAL DATA:  Right upper quadrant pain with nausea EXAM: CT ABDOMEN AND PELVIS WITH CONTRAST TECHNIQUE: Multidetector CT imaging of the abdomen and pelvis was performed using the standard protocol following bolus administration of intravenous contrast. CONTRAST:  180mL OMNIPAQUE IOHEXOL 300 MG/ML  SOLN COMPARISON:  Ultrasound 06/12/2021, radiograph 06/12/2021, CT 03/04/2018 FINDINGS: Lower chest: Lung bases demonstrate no acute consolidation or effusion. Normal cardiac size. Hepatobiliary: Hepatic steatosis. Hyperdensity at the junction of right and left hepatic lobe, likely due to altered perfusion. Multiple calcified gallstones. Mild gallbladder wall thickening with indistinct appearance, likely corresponding to suspected cholecystitis. No biliary dilatation. Multi loculated collection measuring 4.6 x 2.2 by 2 cm adjacent to the gallbladder fundus, corresponding to ultrasound demonstrated abnormality. Pancreas: Unremarkable. No pancreatic ductal dilatation or surrounding inflammatory changes. Spleen: Normal in size without focal abnormality. Adrenals/Urinary Tract: Adrenal glands are normal. Multiple renal cysts. No hydronephrosis. The bladder is normal. Stomach/Bowel: Stomach is within normal limits. History of appendectomy. No evidence of bowel wall thickening, distention, or inflammatory changes. Left colon diverticular disease without acute wall thickening. Vascular/Lymphatic: Moderate aortic atherosclerosis without aneurysm. No suspicious nodes. Reproductive: Prostate is unremarkable. Other: Negative for free air or free fluid. Small fat containing umbilical hernia. Musculoskeletal: No acute or significant osseous findings. IMPRESSION: 1. Multiple gallstones with mild gallbladder wall thickening and surrounding inflammatory change, likely  corresponding to suspected cholecystitis.  4.6 by 2.2 x 2 cm multiloculated collection adjacent to the gallbladder fundus at the junction of the right and left hepatic lobes suspected to represent small abscess versus infected biloma. 2. Hepatic steatosis 3. Colon diverticular disease without acute inflammatory process Electronically Signed   By: Donavan Foil M.D.   On: 06/12/2021 15:23   DG Abdomen Acute W/Chest  Result Date: 06/12/2021 CLINICAL DATA:  Right upper quadrant pain for 2 days EXAM: DG ABDOMEN ACUTE WITH 1 VIEW CHEST COMPARISON:  None. FINDINGS: Cardiac shadow is within normal limits. Aortic calcifications are noted. Lungs are well aerated bilaterally. Calcified granuloma is noted in the left mid. This is stable from the prior exam. Mild degenerative changes of the thoracic spine are noted. Scattered large and small bowel gas is noted. Mild retained fecal material is seen. No free air is noted. No obstructive changes are seen. No abnormal mass or abnormal calcifications are noted. Mild degenerative changes of the thoracolumbar spine are seen. IMPRESSION: Mild retained fecal material consistent with early constipation. No other focal abnormality is noted. Electronically Signed   By: Inez Catalina M.D.   On: 06/12/2021 09:58   US ABDOMEN LIMITED RUQ (LIVER/GB)  Result Date: 06/12/2021 CLINICAL DATA:  Acute right upper quadrant abdominal pain. EXAM: ULTRASOUND ABDOMEN LIMITED RIGHT UPPER QUADRANT COMPARISON:  None. FINDINGS: Gallbladder: Cholelithiasis is noted with mild gallbladder wall thickening. Positive sonographic Murphy's sign is noted. There is a complex fluid collection adjacent to the gallbladder which measures 4.7 x 3.8 x 2.9 cm and is concerning for possible abscess. Common bile duct: Diameter: 3 mm which is within normal limits. Liver: No focal lesion identified. Increased echogenicity of hepatic parenchyma is noted suggesting hepatic steatosis. Portal vein is patent on color Doppler imaging with normal direction of blood flow  towards the liver. Other: None. IMPRESSION: Cholelithiasis is noted with mild gallbladder wall thickening and positive sonographic Murphy's sign concerning for cholecystitis. There is also noted 4.7 x 3.8 x 2.9 cm complex fluid collection adjacent to the gallbladder concerning for possible abscess. CT scan is recommended for further evaluation. Probable hepatic steatosis. Electronically Signed   By: Marijo Conception M.D.   On: 06/12/2021 13:54    EKG: Independently reviewed.  None   Assessment/Plan Principal Problem:   Acute cholecystitis Active Problems:   Benign essential hypertension   Gastroesophageal reflux disease   OSA (obstructive sleep apnea)   Barrett's esophagus   A/C cholecystitis: Per Ct and usg imaging suggestive of cholecystitis and abscess. General surgery consulted and recommendation for IR consult for Percutaneous cholecystostomy and abscess drainage.  We will continue zosyn. Pain meds PRN. IVF o/n.  HTN: Blood pressure 134/68, pulse 85, temperature 99.1 F (37.3 C), temperature source Oral, resp. rate 16, height 5\' 9"  (1.753 m), weight 86.2 kg, SpO2 96 %. NP HOME BP MEDS.  GERD / barrettes esophagus:  Iv ppi.  OSA: Cpap per home settings.    DVT prophylaxis:  SCDs  Code Status:  Full code  Family Communication:  Sherle Poe (Daughter)  [phone] (Home Phone)   Disposition Plan:  To be determined  Consults called:  General surgery by EDMD  Admission status: Inpatient   Para Skeans MD [facility] [phone] How to contact the [facility] Attending or Consulting provider [place] or covering provider during after hours [place], for this patient.    Check the care team in [facility] and look for a) attending/consulting [place] provider listed and b) the  [place] team listed Log into [email or web address] and use [place]'s universal password to access. If you do not have the password, please contact the hospital operator. Locate the [facility] provider you are  looking for under [facility] and page to a number that you can be directly reached. If you still have difficulty reaching the provider, please page the [facility] (Director on Call) for the Hospitalists listed on amion for assistance. [email or web address] Password [facility] 06/12/2021, 4:01 PM

## 2021-06-12 NOTE — ED Triage Notes (Signed)
Patient presents to Urgent Care with complaints of upper right quadrant pain, fatigue, constipation (last BM 2 days ago), and bloating since yesterday. Pt has a hx of ruptured gallbladder. Concerned with gallbladder problem.  Denies any urinary symptoms.

## 2021-06-12 NOTE — ED Notes (Signed)
NPO after Midnight for morning procedure

## 2021-06-12 NOTE — ED Notes (Signed)
Patient is being discharged from the Urgent Care and sent to the Emergency Department via POV. Per Thurmond Butts, NP patient is in need of higher level of care due to advanced imaging. Patient is aware and verbalizes understanding of plan of care.  Vitals:   06/12/21 0825  BP: 106/73  Pulse: (!) 105  Resp: 16  Temp: 99 F (37.2 C)  SpO2: 96%

## 2021-06-13 ENCOUNTER — Inpatient Hospital Stay: Payer: Medicare Other

## 2021-06-13 HISTORY — PX: IR GUIDED DRAIN W CATHETER PLACEMENT: IMG719

## 2021-06-13 HISTORY — PX: IR PERC CHOLECYSTOSTOMY: IMG2326

## 2021-06-13 LAB — CBC
HCT: 37.4 % — ABNORMAL LOW (ref 39.0–52.0)
Hemoglobin: 12.8 g/dL — ABNORMAL LOW (ref 13.0–17.0)
MCH: 31.8 pg (ref 26.0–34.0)
MCHC: 34.2 g/dL (ref 30.0–36.0)
MCV: 92.8 fL (ref 80.0–100.0)
Platelets: 189 10*3/uL (ref 150–400)
RBC: 4.03 MIL/uL — ABNORMAL LOW (ref 4.22–5.81)
RDW: 13.8 % (ref 11.5–15.5)
WBC: 17.6 10*3/uL — ABNORMAL HIGH (ref 4.0–10.5)
nRBC: 0 % (ref 0.0–0.2)

## 2021-06-13 LAB — PROTIME-INR
INR: 1.2 (ref 0.8–1.2)
Prothrombin Time: 15.6 seconds — ABNORMAL HIGH (ref 11.4–15.2)

## 2021-06-13 MED ORDER — FENTANYL CITRATE (PF) 100 MCG/2ML IJ SOLN
INTRAMUSCULAR | Status: AC | PRN
  Administered 2021-06-13: 25 ug via INTRAVENOUS
  Administered 2021-06-13: 50 ug via INTRAVENOUS

## 2021-06-13 MED ORDER — IODIXANOL 320 MG/ML IV SOLN
25.0000 mL | Freq: Once | INTRAVENOUS | Status: AC | PRN
  Administered 2021-06-13: 10 mL

## 2021-06-13 MED ORDER — MIDAZOLAM HCL 2 MG/2ML IJ SOLN
INTRAMUSCULAR | Status: AC
  Filled 2021-06-13: qty 2

## 2021-06-13 MED ORDER — MIDAZOLAM HCL 2 MG/2ML IJ SOLN
INTRAMUSCULAR | Status: AC | PRN
  Administered 2021-06-13: 0.5 mg via INTRAVENOUS
  Administered 2021-06-13: 1 mg via INTRAVENOUS

## 2021-06-13 MED ORDER — SODIUM CHLORIDE 0.9 % IV SOLN
INTRAVENOUS | Status: AC | PRN
  Administered 2021-06-13: 2 g via INTRAVENOUS

## 2021-06-13 MED ORDER — LIDOCAINE 5 % EX PTCH
1.0000 | MEDICATED_PATCH | CUTANEOUS | Status: DC
  Administered 2021-06-13 – 2021-06-14 (×2): 1 via TRANSDERMAL
  Filled 2021-06-13 (×3): qty 1

## 2021-06-13 MED ORDER — SODIUM CHLORIDE 0.9 % IV SOLN: INTRAVENOUS | Status: DC

## 2021-06-13 MED ORDER — MORPHINE SULFATE (PF) 2 MG/ML IV SOLN: 2.0000 mg | INTRAVENOUS | Status: DC | PRN

## 2021-06-13 MED ORDER — FENTANYL CITRATE (PF) 100 MCG/2ML IJ SOLN
INTRAMUSCULAR | Status: AC
  Filled 2021-06-13: qty 2

## 2021-06-13 MED ORDER — PANTOPRAZOLE SODIUM 40 MG PO TBEC
40.0000 mg | DELAYED_RELEASE_TABLET | Freq: Every day | ORAL | Status: DC
  Administered 2021-06-14 – 2021-06-15 (×2): 40 mg via ORAL
  Filled 2021-06-13 (×2): qty 1

## 2021-06-13 MED ORDER — SODIUM CHLORIDE 0.9% FLUSH
5.0000 mL | Freq: Three times a day (TID) | INTRAVENOUS | Status: DC
  Administered 2021-06-13 – 2021-06-15 (×5): 5 mL

## 2021-06-13 NOTE — Progress Notes (Addendum)
[facility] SURGICAL PROGRESS NOTE (cpt [id number])  Hospital Day(s): 1.  Interval History: Patient seen and examined, no acute events or new complaints overnight. Patient reports he is doing well this morning. He is having left hip pain which is new. He denies fever, chills, nausea, emesis, or abdominal pain. His leukocytosis is improved this morning; down to 17.6K. He was able to tolerate full liquids last night, NPO this morning. Continues on Zosyn. Plan for percutaneous cholecystostomy tube and abscess drainage this morning with IR.   Review of Systems:  Constitutional: denies fever, chills  HEENT: denies cough or congestion  Respiratory: denies any shortness of breath  Cardiovascular: denies chest pain or palpitations  Gastrointestinal: denies abdominal pain, N/V, or diarrhea/and bowel function as per interval history Genitourinary: denies burning with urination or urinary frequency Musculoskeletal: + left hip pain   Vital signs in last 24 hours: [min-max] current  Temp:  [98.1 F (36.7 C)-99.7 F (37.6 C)] 98.1 F (36.7 C) (06/28 0211) Pulse Rate:  [57-105] 78 (06/28 0434) Resp:  [14-17] 14 (06/28 0434) BP: (92-156)/(51-87) 92/62 (06/28 0434) SpO2:  [91 %-99 %] 96 % (06/28 0434) Weight:  [86.2 kg] 86.2 kg (06/27 1147)     Height: 5\' 9"  (175.3 cm) Weight: 86.2 kg BMI (Calculated): 28.05   Intake/Output last 2 shifts:  06/27 0701 - 06/28 0700 In: 1000 [IV Piggyback:1000] Out: -    Physical Exam:  Constitutional: alert, cooperative and no distress  HENT: normocephalic without obvious abnormality  Eyes: PERRL, EOM's grossly intact and symmetric  Respiratory: breathing non-labored at rest  Cardiovascular: regular rate and sinus rhythm  Gastrointestinal: Soft, tenderness to deep palpation RUQ, difficult to ascertain if he is distended or not, no rebound/guarding Musculoskeletal: He is point tender over this left greater trochanter, no edema or wounds, motor and  sensation grossly intact, NT    Labs:  CBC Latest Ref Rng & Units 06/12/2021 12/19/2018 09/11/2017  WBC 4.0 - 10.5 K/uL 18.1(H) 5.8 6.7  Hemoglobin 13.0 - 17.0 g/dL 14.6 14.8 14.4  Hematocrit 39.0 - 52.0 % 42.2 44.4 42.4  Platelets 150 - 400 K/uL 222 211.0 191   CMP Latest Ref Rng & Units 06/12/2021 12/19/2018 03/04/2018  Glucose 70 - 99 mg/dL 153(H) 105(H) -  BUN 8 - 23 mg/dL 17 20 -  Creatinine 0.61 - 1.24 mg/dL 0.93 0.83 0.90  Sodium 135 - 145 mmol/L 134(L) 140 -  Potassium 3.5 - 5.1 mmol/L 4.1 4.3 -  Chloride 98 - 111 mmol/L 103 103 -  CO2 22 - 32 mmol/L 23 29 -  Calcium 8.9 - 10.3 mg/dL 9.1 9.5 -  Total Protein 6.5 - 8.1 g/dL 8.3(H) 6.8 -  Total Bilirubin 0.3 - 1.2 mg/dL 0.7 0.5 -  Alkaline Phos 38 - 126 U/L 69 42 -  AST 15 - 41 U/L 23 18 -  ALT 0 - 44 U/L 20 17 -     Imaging studies: No new pertinent imaging studies   Assessment/Plan: (ICD-10's: K81.0) 80 y.o. male with improving leukocytosis, admitted with recurrent acute cholecystitis with abscess, complicated by numerous pertinent comorbidities.   - Appreciate IR assistance; plan for percutaneous cholecystostomy tube placement and abscess drainage this morning   - NPO for procedure; okay to resume diet after and ADAT  - IV Abx (Zosyn) - Monitor abdominal examination - Pain control prn; antiemetics prn - Monitor leukocytosis; improved  - Further management per primary service; we will follow    All of the above findings  and recommendations were discussed with the patient, patient's family (wife at bedside, and the medical team, and all of their questions were answered to their expressed satisfaction.  -- Edison Simon, PA-C [facility] 06/13/2021, 7:28 AM [phone] M-F: 7am - 4pm  Patient was off the floor having drains placed at the time I attempted to see him.  I read the IR procedure note.  I concur with Mr. Gery Pray documentation.

## 2021-06-13 NOTE — Procedures (Signed)
Interventional Radiology Procedure Note  Procedure: Image guided percutaneous cholecystostomy, with 54F drain.  Also, hepatic abscess drain placement.  54F pigtail drain.  Complications: None  EBL: None Sample: Culture sent: 1 - Bulb = hepatic abscess 2 - Cholecystostomy Drain = gallbladder  Recommendations: - Routine drain care, with sterile flushes, record output 1 - Bulb = hepatic abscess 2 - Cholecystostomy Drain = gallbladder - follow up Cx - routine wound care - ok for starting/restarting AC as needed - advance diet ok per primary order  Signed,  Dulcy Fanny. Earleen Newport, DO

## 2021-06-13 NOTE — Progress Notes (Signed)
PROGRESS NOTE    Shawn Khan  SAY:[id number] DOB: 1941-03-04 DOA: 06/12/2021 PCP: Idelle Crouch, MD    Brief Narrative:  Shawn Khan is a 80 y.o. male with hx/o GERD, hemorrhagic stroke, hyperlipidemia, hypertension, osteoporosis, prediabetes, sleep apnea, vocal cord dysfunction, Barrett's esophagus, hypogonadism. presented with abdominal pain. He has known history of acute cholecystitis, last in February of 2022, which was managed at [facility] secondary to concerns for possible ascending cholangitis. Ultimately, this was managed with percutaneous cholecystostomy tube placed at 02/21. Cholecystostomy tube was ultimately removed on 03/28 after drain injection showed patent duct. Due to significant medical comorbid conditions, [facility] surgery elected to not perform cholecystectomy. Work up in the ED revealed a leukocytosis to 18.1K but remaining laboratory work up was reassuring. RUQ [place] and CT Abdomen/Pelvis were concerning for cholelithiasis, cholecystitis, and abscess.  General surgery was consulted on admission. Started on IV abx.   6/28 s/p IR for percutaneous cholecystostomy tube and abscess drainage this am.  He is  c/o Lt hip pain, reports falling down last Wednesday.   Consultants:  Gsx, IR  Procedures:   RUQ [place] (06/12/2021) personally reviewed showing cholelithiasis and possible adjacent abscess, and radiologist report reviewed:  IMPRESSION: Cholelithiasis is noted with mild gallbladder wall thickening and positive sonographic Murphy's sign concerning for cholecystitis. There is also noted 4.7 x 3.8 x 2.9 cm complex fluid collection adjacent to the gallbladder concerning for possible abscess. CT scan is recommended for further evaluation.   CT Abdomen/Pelvis (06/12/2021) personally reviewed showing numerous gallstones, inflammatory changes surrounding the gallbladder, and likely abscess, and radiologist report reviewed below:  IMPRESSION: 1. Multiple gallstones with  mild gallbladder wall thickening and surrounding inflammatory change, likely corresponding to suspected cholecystitis. 4.6 by 2.2 x 2 cm multiloculated collection adjacent to the gallbladder fundus at the junction of the right and left hepatic lobes suspected to represent small abscess versus infected biloma. 2. Hepatic steatosis 3. Colon diverticular disease without acute inflammatory process       Antimicrobials:  zosyn    Subjective: C/o left hip pain only.  Objective: Vitals:   06/13/21 1115 06/13/21 1145 06/13/21 1200 06/13/21 1215  BP: 98/62 108/68 101/81 98/61  Pulse: 64 (!) 55 (!) 45 (!) 52  Resp: 14 15 18 18   Temp:      TempSrc:      SpO2:  94% 94% 91%  Weight:      Height:        Intake/Output Summary (Last 24 hours) at 06/13/2021 1251 Last data filed at 06/12/2021 1826 Gross per 24 hour  Intake 1000 ml  Output --  Net 1000 ml   Filed Weights   06/12/21 1147 06/13/21 0944  Weight: 86.2 kg 86.2 kg    Examination:  General exam: Appears calm and comfortable  Respiratory system: Clear to auscultation. Respiratory effort normal. Cardiovascular system: S1 & S2 heard, RRR. No JVD, murmurs, rubs, gallops or clicks. No pedal edema. Gastrointestinal system: Abdomen Rt draine placed. Midly distention. +bs Central nervous system: Alert and oriented. Grossly intact Extremities: no edema Psychiatry: Judgement and insight appear normal. Mood & affect appropriate.     Data Reviewed: I have personally reviewed following labs and imaging studies  CBC: Recent Labs  Lab 06/12/21 0915 06/13/21 0634  WBC 18.1* 17.6*  NEUTROABS 15.6*  --   HGB 14.6 12.8*  HCT 42.2 37.4*  MCV 90.9 92.8  PLT 222 235   Basic Metabolic Panel: Recent Labs  Lab 06/12/21 0915  NA 134*  K 4.1  CL 103  CO2 23  GLUCOSE 153*  BUN 17  CREATININE 0.93  CALCIUM 9.1   GFR: Estimated Creatinine Clearance: 68.9 mL/min (by C-G formula based on SCr of 0.93 mg/dL). Liver Function  Tests: Recent Labs  Lab 06/12/21 0915  AST 23  ALT 20  ALKPHOS 69  BILITOT 0.7  PROT 8.3*  ALBUMIN 3.8   Recent Labs  Lab 06/12/21 0915  LIPASE 39   No results for input(s): AMMONIA in the last 168 hours. Coagulation Profile: Recent Labs  Lab 06/13/21 0634  INR 1.2   Cardiac Enzymes: No results for input(s): CKTOTAL, CKMB, CKMBINDEX, TROPONINI in the last 168 hours. BNP (last 3 results) No results for input(s): PROBNP in the last 8760 hours. HbA1C: No results for input(s): HGBA1C in the last 72 hours. CBG: No results for input(s): GLUCAP in the last 168 hours. Lipid Profile: No results for input(s): CHOL, HDL, LDLCALC, TRIG, CHOLHDL, LDLDIRECT in the last 72 hours. Thyroid Function Tests: No results for input(s): TSH, T4TOTAL, FREET4, T3FREE, THYROIDAB in the last 72 hours. Anemia Panel: No results for input(s): VITAMINB12, FOLATE, FERRITIN, TIBC, IRON, RETICCTPCT in the last 72 hours. Sepsis Labs: Recent Labs  Lab 06/12/21 1205  LATICACIDVEN 1.7    Recent Results (from the past 240 hour(s))  Blood culture (single)     Status: None (Preliminary result)   Collection Time: 06/12/21 12:05 PM   Specimen: BLOOD LEFT FOREARM  Result Value Ref Range Status   Specimen Description   Final    BLOOD LEFT FOREARM Performed at [facility], [address], [place], [place] [address]    Special Requests   Final    BOTTLES DRAWN AEROBIC AND ANAEROBIC Blood Culture adequate volume Performed at [facility], [address] [place], [place] [address]    Culture  Setup Time PENDING  Incomplete   Culture   Final    NO GROWTH < 24 HOURS Performed at [facility], [place] [address], [place], [place] [address]    Report Status PENDING  Incomplete  Resp Panel by RT-PCR (Flu A&B, Covid) Nasopharyngeal Swab     Status: None   Collection Time: 06/12/21  2:13 PM   Specimen: Nasopharyngeal Swab; Nasopharyngeal(NP) swabs in vial transport medium  Result  Value Ref Range Status   SARS Coronavirus 2 by RT PCR NEGATIVE NEGATIVE Final    Comment: (NOTE) SARS-CoV-2 target nucleic acids are NOT DETECTED.  The SARS-CoV-2 RNA is generally detectable in upper respiratory specimens during the acute phase of infection. The lowest concentration of SARS-CoV-2 viral copies this assay can detect is 138 copies/mL. A negative result does not preclude SARS-Cov-2 infection and should not be used as the sole basis for treatment or other patient management decisions. A negative result may occur with  improper specimen collection/handling, submission of specimen other than nasopharyngeal swab, presence of viral mutation(s) within the areas targeted by this assay, and inadequate number of viral copies(<138 copies/mL). A negative result must be combined with clinical observations, patient history, and epidemiological information. The expected result is Negative.  Fact Sheet for Patients:  EntrepreneurPulse.com.au  Fact Sheet for Healthcare Providers:  IncredibleEmployment.be  This test is no t yet approved or cleared by the [place] FDA and  has been authorized for detection and/or diagnosis of SARS-CoV-2 by FDA under an Emergency Use Authorization (EUA). This EUA will remain  in effect (meaning this test can be used) for the duration of the COVID-19 declaration  under Section 564(b)(1) of the Act, 21 U.S.C.section 360bbb-3(b)(1), unless the authorization is terminated  or revoked sooner.       Influenza A by PCR NEGATIVE NEGATIVE Final   Influenza B by PCR NEGATIVE NEGATIVE Final    Comment: (NOTE) The Xpert Xpress SARS-CoV-2/FLU/RSV plus assay is intended as an aid in the diagnosis of influenza from Nasopharyngeal swab specimens and should not be used as a sole basis for treatment. Nasal washings and aspirates are unacceptable for Xpert Xpress SARS-CoV-2/FLU/RSV testing.  Fact Sheet for  Patients: EntrepreneurPulse.com.au  Fact Sheet for Healthcare Providers: IncredibleEmployment.be  This test is not yet approved or cleared by the [place] FDA and has been authorized for detection and/or diagnosis of SARS-CoV-2 by FDA under an Emergency Use Authorization (EUA). This EUA will remain in effect (meaning this test can be used) for the duration of the COVID-19 declaration under Section 564(b)(1) of the Act, 21 U.S.C. section 360bbb-3(b)(1), unless the authorization is terminated or revoked.  Performed at [facility], [address] [place], [place] [address]          Radiology Studies: [place] Intraoperative  Result Date: 06/13/2021 CLINICAL DATA:  Ultrasound was provided for use by the ordering physician.  No provider Interpretation or professional fees incurred.    CT ABDOMEN PELVIS W CONTRAST  Result Date: 06/12/2021 CLINICAL DATA:  Right upper quadrant pain with nausea EXAM: CT ABDOMEN AND PELVIS WITH CONTRAST TECHNIQUE: Multidetector CT imaging of the abdomen and pelvis was performed using the standard protocol following bolus administration of intravenous contrast. CONTRAST:  158mL OMNIPAQUE IOHEXOL 300 MG/ML  SOLN COMPARISON:  Ultrasound 06/12/2021, radiograph 06/12/2021, CT 03/04/2018 FINDINGS: Lower chest: Lung bases demonstrate no acute consolidation or effusion. Normal cardiac size. Hepatobiliary: Hepatic steatosis. Hyperdensity at the junction of right and left hepatic lobe, likely due to altered perfusion. Multiple calcified gallstones. Mild gallbladder wall thickening with indistinct appearance, likely corresponding to suspected cholecystitis. No biliary dilatation. Multi loculated collection measuring 4.6 x 2.2 by 2 cm adjacent to the gallbladder fundus, corresponding to ultrasound demonstrated abnormality. Pancreas: Unremarkable. No pancreatic ductal dilatation or surrounding inflammatory changes. Spleen: Normal  in size without focal abnormality. Adrenals/Urinary Tract: Adrenal glands are normal. Multiple renal cysts. No hydronephrosis. The bladder is normal. Stomach/Bowel: Stomach is within normal limits. History of appendectomy. No evidence of bowel wall thickening, distention, or inflammatory changes. Left colon diverticular disease without acute wall thickening. Vascular/Lymphatic: Moderate aortic atherosclerosis without aneurysm. No suspicious nodes. Reproductive: Prostate is unremarkable. Other: Negative for free air or free fluid. Small fat containing umbilical hernia. Musculoskeletal: No acute or significant osseous findings. IMPRESSION: 1. Multiple gallstones with mild gallbladder wall thickening and surrounding inflammatory change, likely corresponding to suspected cholecystitis. 4.6 by 2.2 x 2 cm multiloculated collection adjacent to the gallbladder fundus at the junction of the right and left hepatic lobes suspected to represent small abscess versus infected biloma. 2. Hepatic steatosis 3. Colon diverticular disease without acute inflammatory process Electronically Signed   By: Donavan Foil M.D.   On: 06/12/2021 15:23   IR Guided Niel Hummer W Catheter Placement  Result Date: 06/13/2021 INDICATION: 80 year old male with recurrent acute cholecystitis after prior percutaneous cholecystostomy and drainage status post drain removal. On this admission he has intrahepatic abscess complicating the acute cholecystitis EXAM: IMAGE GUIDED PERCUTANEOUS CHOLECYSTOSTOMY IMAGE GUIDED DRAINAGE OF INTRAHEPATIC ABSCESS MEDICATIONS: None ANESTHESIA/SEDATION: Moderate (conscious) sedation was employed during this procedure. A total of Versed 1.5 mg and Fentanyl 75 mcg was administered intravenously. Moderate Sedation Time:  19 minutes. The patient's level of consciousness and vital signs were monitored continuously by radiology nursing throughout the procedure under my direct supervision. FLUOROSCOPY TIME:  Fluoroscopy Time: 0  minutes 6 seconds (1.9 mGy). COMPLICATIONS: None PROCEDURE: Informed written consent was obtained from the patient and the patient's family after a thorough discussion of the procedural risks, benefits and alternatives. All questions were addressed. Maximal Sterile Barrier Technique was utilized including caps, mask, sterile gowns, sterile gloves, sterile drape, hand hygiene and skin antiseptic. A timeout was performed prior to the initiation of the procedure. Ultrasound survey of the right upper quadrant was performed for planning purposes. Once the patient is prepped and draped in the usual sterile fashion, the skin and subcutaneous tissues overlying the gallbladder were generously infiltrated 1% lidocaine for local anesthesia. A coaxial needle was advanced under ultrasound guidance through the skin subcutaneous tissues and a small segment of liver into the gallbladder lumen. With removal of the stylet, spontaneous bile drainage occurred. The 018 Mandril wire was advanced under x-ray guidance into the gallbladder lumen. At this point, the percutaneous drainage was performed. 1% lidocaine was used for local anesthesia. Small stab incision was made with 11 blade scalpel. Tract dilation was performed with a hemostat. Using ultrasound guidance, trocar technique was used to place a 10 [place] drain into the abscess within the liver adjacent to the gallbladder fossa. Once the drain was in position aspiration of frankly purulent material confirmed location. Pigtail catheter was formed and the retention suture was ligated Using modified Seldinger technique, we then placed a 10 French drain was placed into the gallbladder fossa, with aspiration of bile the sample for the lab. Contrast injection confirmed position of the tube within the gallbladder lumen. The abscess drain was attached to bulb suction and sutured in position. The percutaneous cholecystostomy drain was attached to gravity and sutured in position. Patient  tolerated the procedure well and remained hemodynamically stable throughout. No complications were encountered and no significant blood loss encountered. FINDINGS: Drain labeled 1 into the hepatic abscess, 10 French, to bulb suction, with a sample labeled 1 sent to the lab. Drain labeled 2 into the gallbladder, 10 [place], to gravity drain, with a sample labeled 2 sent to the lab. IMPRESSION: Status post image guided placement of percutaneous cholecystostomy as well as image guided drainage of intrahepatic abscess. Signed, Dulcy Fanny. Dellia Nims, RPVI [facility] Electronically Signed   By: Corrie Mckusick D.O.   On: 06/13/2021 12:15   IR Perc Cholecystostomy  Result Date: 06/13/2021 INDICATION: 80 year old male with recurrent acute cholecystitis after prior percutaneous cholecystostomy and drainage status post drain removal. On this admission he has intrahepatic abscess complicating the acute cholecystitis EXAM: IMAGE GUIDED PERCUTANEOUS CHOLECYSTOSTOMY IMAGE GUIDED DRAINAGE OF INTRAHEPATIC ABSCESS MEDICATIONS: None ANESTHESIA/SEDATION: Moderate (conscious) sedation was employed during this procedure. A total of Versed 1.5 mg and Fentanyl 75 mcg was administered intravenously. Moderate Sedation Time: 19 minutes. The patient's level of consciousness and vital signs were monitored continuously by radiology nursing throughout the procedure under my direct supervision. FLUOROSCOPY TIME:  Fluoroscopy Time: 0 minutes 6 seconds (1.9 mGy). COMPLICATIONS: None PROCEDURE: Informed written consent was obtained from the patient and the patient's family after a thorough discussion of the procedural risks, benefits and alternatives. All questions were addressed. Maximal Sterile Barrier Technique was utilized including caps, mask, sterile gowns, sterile gloves, sterile drape, hand hygiene and skin antiseptic. A timeout was performed prior to the initiation of the procedure.  Ultrasound  survey of the right upper quadrant was performed for planning purposes. Once the patient is prepped and draped in the usual sterile fashion, the skin and subcutaneous tissues overlying the gallbladder were generously infiltrated 1% lidocaine for local anesthesia. A coaxial needle was advanced under ultrasound guidance through the skin subcutaneous tissues and a small segment of liver into the gallbladder lumen. With removal of the stylet, spontaneous bile drainage occurred. The 018 Mandril wire was advanced under x-ray guidance into the gallbladder lumen. At this point, the percutaneous drainage was performed. 1% lidocaine was used for local anesthesia. Small stab incision was made with 11 blade scalpel. Tract dilation was performed with a hemostat. Using ultrasound guidance, trocar technique was used to place a 10 [place] drain into the abscess within the liver adjacent to the gallbladder fossa. Once the drain was in position aspiration of frankly purulent material confirmed location. Pigtail catheter was formed and the retention suture was ligated Using modified Seldinger technique, we then placed a 10 French drain was placed into the gallbladder fossa, with aspiration of bile the sample for the lab. Contrast injection confirmed position of the tube within the gallbladder lumen. The abscess drain was attached to bulb suction and sutured in position. The percutaneous cholecystostomy drain was attached to gravity and sutured in position. Patient tolerated the procedure well and remained hemodynamically stable throughout. No complications were encountered and no significant blood loss encountered. FINDINGS: Drain labeled 1 into the hepatic abscess, 10 French, to bulb suction, with a sample labeled 1 sent to the lab. Drain labeled 2 into the gallbladder, 10 [place], to gravity drain, with a sample labeled 2 sent to the lab. IMPRESSION: Status post image guided placement of percutaneous cholecystostomy as well  as image guided drainage of intrahepatic abscess. Signed, Dulcy Fanny. Dellia Nims, RPVI [facility] Electronically Signed   By: Corrie Mckusick D.O.   On: 06/13/2021 12:15   DG Abdomen Acute W/Chest  Result Date: 06/12/2021 CLINICAL DATA:  Right upper quadrant pain for 2 days EXAM: DG ABDOMEN ACUTE WITH 1 VIEW CHEST COMPARISON:  None. FINDINGS: Cardiac shadow is within normal limits. Aortic calcifications are noted. Lungs are well aerated bilaterally. Calcified granuloma is noted in the left mid. This is stable from the prior exam. Mild degenerative changes of the thoracic spine are noted. Scattered large and small bowel gas is noted. Mild retained fecal material is seen. No free air is noted. No obstructive changes are seen. No abnormal mass or abnormal calcifications are noted. Mild degenerative changes of the thoracolumbar spine are seen. IMPRESSION: Mild retained fecal material consistent with early constipation. No other focal abnormality is noted. Electronically Signed   By: Inez Catalina M.D.   On: 06/12/2021 09:58   US ABDOMEN LIMITED RUQ (LIVER/GB)  Result Date: 06/12/2021 CLINICAL DATA:  Acute right upper quadrant abdominal pain. EXAM: ULTRASOUND ABDOMEN LIMITED RIGHT UPPER QUADRANT COMPARISON:  None. FINDINGS: Gallbladder: Cholelithiasis is noted with mild gallbladder wall thickening. Positive sonographic Murphy's sign is noted. There is a complex fluid collection adjacent to the gallbladder which measures 4.7 x 3.8 x 2.9 cm and is concerning for possible abscess. Common bile duct: Diameter: 3 mm which is within normal limits. Liver: No focal lesion identified. Increased echogenicity of hepatic parenchyma is noted suggesting hepatic steatosis. Portal vein is patent on color Doppler imaging with normal direction of blood flow towards the liver. Other: None. IMPRESSION: Cholelithiasis is noted with mild gallbladder wall thickening and positive  sonographic  Murphy's sign concerning for cholecystitis. There is also noted 4.7 x 3.8 x 2.9 cm complex fluid collection adjacent to the gallbladder concerning for possible abscess. CT scan is recommended for further evaluation. Probable hepatic steatosis. Electronically Signed   By: Marijo Conception M.D.   On: 06/12/2021 13:54        Scheduled Meds:  fentaNYL       heparin  5,000 Units Subcutaneous Q8H   lidocaine  1 patch Transdermal Q24H   midazolam       pantoprazole (PROTONIX) IV  40 mg Intravenous Q12H   sodium chloride flush  5 mL Intracatheter Q8H   Continuous Infusions:  cefOXitin Stopped (06/13/21 0309)   piperacillin-tazobactam (ZOSYN)  IV 3.375 g (06/13/21 0602)    Assessment & Plan:   Principal Problem:   Acute cholecystitis Active Problems:   Benign essential hypertension   Gastroesophageal reflux disease   OSA (obstructive sleep apnea)   Barrett's esophagus   Recurrent cholecystitis: General surgery was consulted input was appreciated. Patient is status post percutaneous cholecystectomy and abscess drained by IR today Resume diet Pain control Monitor labs Continue Zosyn   HTN: Currently on low normal side Start IV fluids for gentle hydration Monitor   GERD / barrettes esophagus: On PPI  Lt hip pain-s/p fall last week Will place on lidocaine patch Check left hip x-ray   OSA: Cpap per home settings.   DVT prophylaxis: SCD Code Status: Full Family Communication: Wife at bedside Disposition Plan:  Status is: Inpatient  Remains inpatient appropriate because:Inpatient level of care appropriate due to severity of illness  Dispo: The patient is from: Home              Anticipated d/c is to: Home              Patient currently is not medically stable to d/c.   Difficult to place patient No            LOS: 1 day   Time spent: 35 minutes with more than 50% on [place], MD [facility] Pager [phone]  If  7PM-7AM, please contact night-coverage 06/13/2021, 12:51 PM

## 2021-06-13 NOTE — Progress Notes (Signed)
Dr. Earleen Newport speaking with wife and pt. At bedside in [place] re: procedure to be performed. Both verbalize understanding of conversation.

## 2021-06-13 NOTE — ED Notes (Signed)
Report given to Vickie in specials at this time. She reports goal is to take pt for procedure around 1030a

## 2021-06-14 DIAGNOSIS — Z978 Presence of other specified devices: Secondary | ICD-10-CM

## 2021-06-14 LAB — BASIC METABOLIC PANEL
Anion gap: 6 (ref 5–15)
BUN: 18 mg/dL (ref 8–23)
CO2: 24 mmol/L (ref 22–32)
Calcium: 8.3 mg/dL — ABNORMAL LOW (ref 8.9–10.3)
Chloride: 104 mmol/L (ref 98–111)
Creatinine, Ser: 0.95 mg/dL (ref 0.61–1.24)
GFR, Estimated: >60 mL/min (ref >60)
Glucose, Bld: 148 mg/dL — ABNORMAL HIGH (ref 70–99)
Potassium: 3.6 mmol/L (ref 3.5–5.1)
Sodium: 134 mmol/L — ABNORMAL LOW (ref 135–145)

## 2021-06-14 LAB — CBC
HCT: 37.9 % — ABNORMAL LOW (ref 39.0–52.0)
Hemoglobin: 12.8 g/dL — ABNORMAL LOW (ref 13.0–17.0)
MCH: 31.4 pg (ref 26.0–34.0)
MCHC: 33.8 g/dL (ref 30.0–36.0)
MCV: 92.9 fL (ref 80.0–100.0)
Platelets: 191 10*3/uL (ref 150–400)
RBC: 4.08 MIL/uL — ABNORMAL LOW (ref 4.22–5.81)
RDW: 13.7 % (ref 11.5–15.5)
WBC: 8.4 10*3/uL (ref 4.0–10.5)
nRBC: 0 % (ref 0.0–0.2)

## 2021-06-14 LAB — HEMOGLOBIN A1C
Hgb A1c MFr Bld: 6.1 % — ABNORMAL HIGH (ref 4.8–5.6)
Mean Plasma Glucose: 128 mg/dL

## 2021-06-14 MED ORDER — TAMSULOSIN HCL 0.4 MG PO CAPS
0.4000 mg | ORAL_CAPSULE | Freq: Two times a day (BID) | ORAL | Status: DC
  Administered 2021-06-14 – 2021-06-15 (×3): 0.4 mg via ORAL
  Filled 2021-06-14 (×3): qty 1

## 2021-06-14 MED ORDER — LORATADINE 10 MG PO TABS
10.0000 mg | ORAL_TABLET | Freq: Every day | ORAL | Status: DC
  Administered 2021-06-14 – 2021-06-15 (×2): 10 mg via ORAL
  Filled 2021-06-14 (×2): qty 1

## 2021-06-14 MED ORDER — ASCORBIC ACID 500 MG PO TABS
500.0000 mg | ORAL_TABLET | Freq: Every day | ORAL | Status: DC
  Administered 2021-06-14 – 2021-06-15 (×2): 500 mg via ORAL
  Filled 2021-06-14 (×2): qty 1

## 2021-06-14 MED ORDER — VITAMIN B-12 1000 MCG PO TABS
1000.0000 ug | ORAL_TABLET | Freq: Every day | ORAL | Status: DC
  Administered 2021-06-14 – 2021-06-15 (×2): 1000 ug via ORAL
  Filled 2021-06-14 (×2): qty 1

## 2021-06-14 MED ORDER — VITAMIN D 25 MCG (1000 UNIT) PO TABS
1000.0000 [IU] | ORAL_TABLET | Freq: Every day | ORAL | Status: DC
  Administered 2021-06-14 – 2021-06-15 (×2): 1000 [IU] via ORAL
  Filled 2021-06-14 (×2): qty 1

## 2021-06-14 MED ORDER — VENLAFAXINE HCL ER 37.5 MG PO CP24
37.5000 mg | ORAL_CAPSULE | Freq: Every day | ORAL | Status: DC
  Administered 2021-06-14 – 2021-06-15 (×2): 37.5 mg via ORAL
  Filled 2021-06-14 (×2): qty 1

## 2021-06-14 MED ORDER — ACETAMINOPHEN 325 MG PO TABS
650.0000 mg | ORAL_TABLET | Freq: Four times a day (QID) | ORAL | Status: DC | PRN
  Administered 2021-06-14 – 2021-06-15 (×3): 650 mg via ORAL
  Filled 2021-06-14 (×2): qty 2

## 2021-06-14 NOTE — Progress Notes (Signed)
[facility] NAME: Shawn Khan    MR#:  [id number]  DATE OF BIRTH:  Dec 28, 1940  SUBJECTIVE:  patient overall feels a lot better. No fever. Tolerated liquid diet now advanced to regular consistency by surgery. Wife at bedside. No fever. Ambulated well with physical therapy.  REVIEW OF SYSTEMS:   Review of Systems  Constitutional:  Negative for chills, fever and weight loss.  HENT:  Negative for ear discharge, ear pain and nosebleeds.   Eyes:  Negative for blurred vision, pain and discharge.  Respiratory:  Negative for sputum production, shortness of breath, wheezing and stridor.   Cardiovascular:  Negative for chest pain, palpitations, orthopnea and PND.  Gastrointestinal:  Negative for abdominal pain, diarrhea, nausea and vomiting.  Genitourinary:  Negative for frequency and urgency.  Musculoskeletal:  Negative for back pain and joint pain.  Neurological:  Negative for sensory change, speech change, focal weakness and weakness.  Psychiatric/Behavioral:  Negative for depression and hallucinations. The patient is not nervous/anxious.   Tolerating Diet:yes Tolerating PT: No PT needed  DRUG ALLERGIES:  No Known Allergies  VITALS:  Blood pressure 109/76, pulse 66, temperature 98.2 F (36.8 C), resp. rate 18, height 5\' 9"  (1.753 m), weight 89 kg, SpO2 97 %.  PHYSICAL EXAMINATION:   Physical Exam  GENERAL:  80 y.o.-year-old patient lying in the bed with no acute distress.  LUNGS: Normal breath sounds bilaterally, no wheezing, rales, rhonchi. No use of accessory muscles of respiration.  CARDIOVASCULAR: S1, S2 normal. No murmurs, rubs, or gallops.  ABDOMEN: Soft, nontender, nondistended. Bowel sounds present. No organomegaly or mass.  2 drains right UQ EXTREMITIES: No cyanosis, clubbing or edema b/l.    NEUROLOGIC:non focal PSYCHIATRIC:  patient is alert and oriented x 3.  SKIN: No obvious rash, lesion, or ulcer.    LABORATORY PANEL:  CBC Recent Labs  Lab 06/14/21 0932  WBC 8.4  HGB 12.8*  HCT 37.9*  PLT 191    Chemistries  Recent Labs  Lab 06/12/21 0915 06/14/21 0932  NA 134* 134*  K 4.1 3.6  CL 103 104  CO2 23 24  GLUCOSE 153* 148*  BUN 17 18  CREATININE 0.93 0.95  CALCIUM 9.1 8.3*  AST 23  --   ALT 20  --   ALKPHOS 69  --   BILITOT 0.7  --    Cardiac Enzymes No results for input(s): TROPONINI in the last 168 hours. RADIOLOGY:  [place] Intraoperative  Result Date: 06/13/2021 INDICATION: 80 year old male with recurrent acute cholecystitis after prior percutaneous cholecystostomy and drainage status post drain removal. On this admission he has intrahepatic abscess complicating the acute cholecystitis EXAM: IMAGE GUIDED PERCUTANEOUS CHOLECYSTOSTOMY IMAGE GUIDED DRAINAGE OF INTRAHEPATIC ABSCESS MEDICATIONS: None ANESTHESIA/SEDATION: Moderate (conscious) sedation was employed during this procedure. A total of Versed 1.5 mg and Fentanyl 75 mcg was administered intravenously. Moderate Sedation Time: 19 minutes. The patient's level of consciousness and vital signs were monitored continuously by radiology nursing throughout the procedure under my direct supervision. FLUOROSCOPY TIME:  Fluoroscopy Time: 0 minutes 6 seconds (1.9 mGy). COMPLICATIONS: None PROCEDURE: Informed written consent was obtained from the patient and the patient's family after a thorough discussion of the procedural risks, benefits and alternatives. All questions were addressed. Maximal Sterile Barrier Technique was utilized including caps, mask, sterile gowns, sterile gloves, sterile drape, hand hygiene and skin antiseptic. A timeout was performed prior to the initiation of the procedure. Ultrasound survey of the right upper quadrant  was performed for planning purposes. Once the patient is prepped and draped in the usual sterile fashion, the skin and subcutaneous tissues overlying the gallbladder were generously infiltrated 1%  lidocaine for local anesthesia. A coaxial needle was advanced under ultrasound guidance through the skin subcutaneous tissues and a small segment of liver into the gallbladder lumen. With removal of the stylet, spontaneous bile drainage occurred. The 018 Mandril wire was advanced under x-ray guidance into the gallbladder lumen. At this point, the percutaneous drainage was performed. 1% lidocaine was used for local anesthesia. Small stab incision was made with 11 blade scalpel. Tract dilation was performed with a hemostat. Using ultrasound guidance, trocar technique was used to place a 10 [place] drain into the abscess within the liver adjacent to the gallbladder fossa. Once the drain was in position aspiration of frankly purulent material confirmed location. Pigtail catheter was formed and the retention suture was ligated Using modified Seldinger technique, we then placed a 10 French drain was placed into the gallbladder fossa, with aspiration of bile the sample for the lab. Contrast injection confirmed position of the tube within the gallbladder lumen. The abscess drain was attached to bulb suction and sutured in position. The percutaneous cholecystostomy drain was attached to gravity and sutured in position. Patient tolerated the procedure well and remained hemodynamically stable throughout. No complications were encountered and no significant blood loss encountered. FINDINGS: Drain labeled 1 into the hepatic abscess, 10 French, to bulb suction, with a sample labeled 1 sent to the lab. Drain labeled 2 into the gallbladder, 10 [place], to gravity drain, with a sample labeled 2 sent to the lab. IMPRESSION: Status post image guided placement of percutaneous cholecystostomy as well as image guided drainage of intrahepatic abscess. Signed, Dulcy Fanny. Dellia Nims, RPVI [facility] Radiology Electronically Signed   By: Corrie Mckusick D.O.   On: 06/13/2021 12:15   CT ABDOMEN  PELVIS W CONTRAST  Result Date: 06/12/2021 CLINICAL DATA:  Right upper quadrant pain with nausea EXAM: CT ABDOMEN AND PELVIS WITH CONTRAST TECHNIQUE: Multidetector CT imaging of the abdomen and pelvis was performed using the standard protocol following bolus administration of intravenous contrast. CONTRAST:  151mL OMNIPAQUE IOHEXOL 300 MG/ML  SOLN COMPARISON:  Ultrasound 06/12/2021, radiograph 06/12/2021, CT 03/04/2018 FINDINGS: Lower chest: Lung bases demonstrate no acute consolidation or effusion. Normal cardiac size. Hepatobiliary: Hepatic steatosis. Hyperdensity at the junction of right and left hepatic lobe, likely due to altered perfusion. Multiple calcified gallstones. Mild gallbladder wall thickening with indistinct appearance, likely corresponding to suspected cholecystitis. No biliary dilatation. Multi loculated collection measuring 4.6 x 2.2 by 2 cm adjacent to the gallbladder fundus, corresponding to ultrasound demonstrated abnormality. Pancreas: Unremarkable. No pancreatic ductal dilatation or surrounding inflammatory changes. Spleen: Normal in size without focal abnormality. Adrenals/Urinary Tract: Adrenal glands are normal. Multiple renal cysts. No hydronephrosis. The bladder is normal. Stomach/Bowel: Stomach is within normal limits. History of appendectomy. No evidence of bowel wall thickening, distention, or inflammatory changes. Left colon diverticular disease without acute wall thickening. Vascular/Lymphatic: Moderate aortic atherosclerosis without aneurysm. No suspicious nodes. Reproductive: Prostate is unremarkable. Other: Negative for free air or free fluid. Small fat containing umbilical hernia. Musculoskeletal: No acute or significant osseous findings. IMPRESSION: 1. Multiple gallstones with mild gallbladder wall thickening and surrounding inflammatory change, likely corresponding to suspected cholecystitis. 4.6 by 2.2 x 2 cm multiloculated collection adjacent to the gallbladder fundus at  the junction of the right and left hepatic lobes suspected to represent small abscess  versus infected biloma. 2. Hepatic steatosis 3. Colon diverticular disease without acute inflammatory process Electronically Signed   By: Donavan Foil M.D.   On: 06/12/2021 15:23   IR Guided Niel Hummer W Catheter Placement  Result Date: 06/13/2021 INDICATION: 80 year old male with recurrent acute cholecystitis after prior percutaneous cholecystostomy and drainage status post drain removal. On this admission he has intrahepatic abscess complicating the acute cholecystitis EXAM: IMAGE GUIDED PERCUTANEOUS CHOLECYSTOSTOMY IMAGE GUIDED DRAINAGE OF INTRAHEPATIC ABSCESS MEDICATIONS: None ANESTHESIA/SEDATION: Moderate (conscious) sedation was employed during this procedure. A total of Versed 1.5 mg and Fentanyl 75 mcg was administered intravenously. Moderate Sedation Time: 19 minutes. The patient's level of consciousness and vital signs were monitored continuously by radiology nursing throughout the procedure under my direct supervision. FLUOROSCOPY TIME:  Fluoroscopy Time: 0 minutes 6 seconds (1.9 mGy). COMPLICATIONS: None PROCEDURE: Informed written consent was obtained from the patient and the patient's family after a thorough discussion of the procedural risks, benefits and alternatives. All questions were addressed. Maximal Sterile Barrier Technique was utilized including caps, mask, sterile gowns, sterile gloves, sterile drape, hand hygiene and skin antiseptic. A timeout was performed prior to the initiation of the procedure. Ultrasound survey of the right upper quadrant was performed for planning purposes. Once the patient is prepped and draped in the usual sterile fashion, the skin and subcutaneous tissues overlying the gallbladder were generously infiltrated 1% lidocaine for local anesthesia. A coaxial needle was advanced under ultrasound guidance through the skin subcutaneous tissues and a small segment of liver into the  gallbladder lumen. With removal of the stylet, spontaneous bile drainage occurred. The 018 Mandril wire was advanced under x-ray guidance into the gallbladder lumen. At this point, the percutaneous drainage was performed. 1% lidocaine was used for local anesthesia. Small stab incision was made with 11 blade scalpel. Tract dilation was performed with a hemostat. Using ultrasound guidance, trocar technique was used to place a 10 [place] drain into the abscess within the liver adjacent to the gallbladder fossa. Once the drain was in position aspiration of frankly purulent material confirmed location. Pigtail catheter was formed and the retention suture was ligated Using modified Seldinger technique, we then placed a 10 French drain was placed into the gallbladder fossa, with aspiration of bile the sample for the lab. Contrast injection confirmed position of the tube within the gallbladder lumen. The abscess drain was attached to bulb suction and sutured in position. The percutaneous cholecystostomy drain was attached to gravity and sutured in position. Patient tolerated the procedure well and remained hemodynamically stable throughout. No complications were encountered and no significant blood loss encountered. FINDINGS: Drain labeled 1 into the hepatic abscess, 10 French, to bulb suction, with a sample labeled 1 sent to the lab. Drain labeled 2 into the gallbladder, 10 [place], to gravity drain, with a sample labeled 2 sent to the lab. IMPRESSION: Status post image guided placement of percutaneous cholecystostomy as well as image guided drainage of intrahepatic abscess. Signed, Dulcy Fanny. Dellia Nims, RPVI [facility] Radiology Electronically Signed   By: Corrie Mckusick D.O.   On: 06/13/2021 12:15   IR Perc Cholecystostomy  Result Date: 06/13/2021 INDICATION: 80 year old male with recurrent acute cholecystitis after prior percutaneous cholecystostomy and drainage status  post drain removal. On this admission he has intrahepatic abscess complicating the acute cholecystitis EXAM: IMAGE GUIDED PERCUTANEOUS CHOLECYSTOSTOMY IMAGE GUIDED DRAINAGE OF INTRAHEPATIC ABSCESS MEDICATIONS: None ANESTHESIA/SEDATION: Moderate (conscious) sedation was employed during this procedure. A total of Versed 1.5 mg and  Fentanyl 75 mcg was administered intravenously. Moderate Sedation Time: 19 minutes. The patient's level of consciousness and vital signs were monitored continuously by radiology nursing throughout the procedure under my direct supervision. FLUOROSCOPY TIME:  Fluoroscopy Time: 0 minutes 6 seconds (1.9 mGy). COMPLICATIONS: None PROCEDURE: Informed written consent was obtained from the patient and the patient's family after a thorough discussion of the procedural risks, benefits and alternatives. All questions were addressed. Maximal Sterile Barrier Technique was utilized including caps, mask, sterile gowns, sterile gloves, sterile drape, hand hygiene and skin antiseptic. A timeout was performed prior to the initiation of the procedure. Ultrasound survey of the right upper quadrant was performed for planning purposes. Once the patient is prepped and draped in the usual sterile fashion, the skin and subcutaneous tissues overlying the gallbladder were generously infiltrated 1% lidocaine for local anesthesia. A coaxial needle was advanced under ultrasound guidance through the skin subcutaneous tissues and a small segment of liver into the gallbladder lumen. With removal of the stylet, spontaneous bile drainage occurred. The 018 Mandril wire was advanced under x-ray guidance into the gallbladder lumen. At this point, the percutaneous drainage was performed. 1% lidocaine was used for local anesthesia. Small stab incision was made with 11 blade scalpel. Tract dilation was performed with a hemostat. Using ultrasound guidance, trocar technique was used to place a 10 [place] drain into the abscess within  the liver adjacent to the gallbladder fossa. Once the drain was in position aspiration of frankly purulent material confirmed location. Pigtail catheter was formed and the retention suture was ligated Using modified Seldinger technique, we then placed a 10 French drain was placed into the gallbladder fossa, with aspiration of bile the sample for the lab. Contrast injection confirmed position of the tube within the gallbladder lumen. The abscess drain was attached to bulb suction and sutured in position. The percutaneous cholecystostomy drain was attached to gravity and sutured in position. Patient tolerated the procedure well and remained hemodynamically stable throughout. No complications were encountered and no significant blood loss encountered. FINDINGS: Drain labeled 1 into the hepatic abscess, 10 French, to bulb suction, with a sample labeled 1 sent to the lab. Drain labeled 2 into the gallbladder, 10 [place], to gravity drain, with a sample labeled 2 sent to the lab. IMPRESSION: Status post image guided placement of percutaneous cholecystostomy as well as image guided drainage of intrahepatic abscess. Signed, Dulcy Fanny. Dellia Nims, RPVI [facility] Radiology Electronically Signed   By: Corrie Mckusick D.O.   On: 06/13/2021 12:15   DG HIP UNILAT WITH PELVIS 2-3 VIEWS LEFT  Result Date: 06/13/2021 CLINICAL DATA:  [place] 1 week ago.  Left hip pain. EXAM: DG HIP (WITH OR WITHOUT PELVIS) 2-3V LEFT COMPARISON:  None. FINDINGS: No acute left hip abnormality. No degenerative change or fracture. No acute pelvic fracture. Old right femoral nail. IMPRESSION: No acute radiographic finding.  Left hip appears negative. Electronically Signed   By: Nelson Chimes M.D.   On: 06/13/2021 15:44   US ABDOMEN LIMITED RUQ (LIVER/GB)  Result Date: 06/12/2021 CLINICAL DATA:  Acute right upper quadrant abdominal pain. EXAM: ULTRASOUND ABDOMEN LIMITED RIGHT UPPER QUADRANT COMPARISON:   None. FINDINGS: Gallbladder: Cholelithiasis is noted with mild gallbladder wall thickening. Positive sonographic Murphy's sign is noted. There is a complex fluid collection adjacent to the gallbladder which measures 4.7 x 3.8 x 2.9 cm and is concerning for possible abscess. Common bile duct: Diameter: 3 mm which is within normal limits. Liver: No  focal lesion identified. Increased echogenicity of hepatic parenchyma is noted suggesting hepatic steatosis. Portal vein is patent on color Doppler imaging with normal direction of blood flow towards the liver. Other: None. IMPRESSION: Cholelithiasis is noted with mild gallbladder wall thickening and positive sonographic Murphy's sign concerning for cholecystitis. There is also noted 4.7 x 3.8 x 2.9 cm complex fluid collection adjacent to the gallbladder concerning for possible abscess. CT scan is recommended for further evaluation. Probable hepatic steatosis. Electronically Signed   By: Marijo Conception M.D.   On: 06/12/2021 13:54   ASSESSMENT AND PLAN:  AARYA QUEBEDEAUX is a 80 y.o. male with hx/o GERD, hemorrhagic stroke, hyperlipidemia, hypertension, osteoporosis, prediabetes, sleep apnea, vocal cord dysfunction, Barrett's esophagus, hypogonadism. presented with abdominal pain  Recurrent cholecystitis: --General surgery  input is appreciated. --Patient is status post percutaneous cholecystostomy and abscess drained by IR ton 6/27 --Resume diet now on heart healthy --Pain control --Continue Zosyn-- de-escalate to oral antibiotics once final fluid culture results are available hopefully tomorrow -- white count down to 8.4 (17K) -- patient clinically improving   GERD / barrettes esophagus: On PPI   Lt hip pain-s/p fall last week Will place on lidocaine patch --left hip x-ray--no fracture -- patient ambulated with physical therapy without difficulty.   OSA: Cpap per home settings.    Overall improving. Once final culture results are available will  change to oral antibiotics likely tomorrow and plan on potential discharge home. Plan was discussed with patient and in agreement.   DVT prophylaxis: SCD Code Status: Full Family Communication: Wife at bedside Disposition Plan:  Status is: Inpatient   Remains inpatient appropriate because:Inpatient level of care appropriate due to severity of illness   Dispo: The patient is from: Home              Anticipated d/c is to: Home              Patient currently is medically stable to d/c. Will monitor for one more day              Difficult to place patient No           TOTAL TIME TAKING CARE OF THIS PATIENT: 25 minutes.  >50% time spent on counselling and coordination of care  Note: This dictation was prepared with Dragon dictation along with smaller phrase technology. Any transcriptional errors that result from this process are unintentional.  Fritzi Mandes M.D    [facility]   CC: Primary care physician; Idelle Crouch, MD Patient ID: NOBUO NUNZIATA, male   DOB: 1941/08/21, 80 y.o.   MRN: [id number]

## 2021-06-14 NOTE — Progress Notes (Addendum)
[facility] SURGICAL PROGRESS NOTE (cpt [phone])   Hospital Day(s): 2.   Interval History: Patient seen and examined, no acute events or new complaints overnight. Patient reports he is overall feeling better, just mild soreness at drain sites. He does also continue to have eft hip pain, but this is improved. He denied fever, chills, nausea, emesis. Labs this morning (CBC, BMP) are pending. He did undergo percutaneous cholecystostomy and abscess drain placement yesterday (06/28) with IR. Cx from abscess growing [place]. Cx from gallbladder growing GNR & GPC in pairs. He continues on Zosyn. Tolerating CLD without issues.    Review of Systems:  Constitutional: denies fever, chills  HEENT: denies cough or congestion  Respiratory: denies any shortness of breath  Cardiovascular: denies chest pain or palpitations  Gastrointestinal: denies abdominal pain, N/V, or diarrhea/and bowel function as per interval history Genitourinary: denies burning with urination or urinary frequency Musculoskeletal: + left hip pain  Vital signs in last 24 hours: [min-max] current  Temp:  [97.6 F (36.4 C)-98.6 F (37 C)] 98.6 F (37 C) (06/29 0436) Pulse Rate:  [45-79] 61 (06/29 0436) Resp:  [14-20] 14 (06/29 0436) BP: (91-125)/(61-81) 91/67 (06/29 0436) SpO2:  [91 %-98 %] 96 % (06/29 0436) Weight:  [86.2 kg-89 kg] 89 kg (06/29 0500)     Height: 5\' 9"  (175.3 cm) Weight: 89 kg BMI (Calculated): 28.98   Intake/Output last 2 shifts:  06/28 0701 - 06/29 0700 In: 343.7 [I.V.:123.5; IV Piggyback:220.2] Out: 50 [Drains:50]   Physical Exam:  Constitutional: alert, cooperative and no distress HENT: normocephalic without obvious abnormality Eyes: PERRL, EOM's grossly intact and symmetric Respiratory: breathing non-labored at rest Cardiovascular: regular rate and sinus rhythm Gastrointestinal: Soft, non-tender, non-distended, no rebound/guarding. Percutaneous cholecystostomy in the RUQ, output  seropurulent. Abscess drain in RUQ; output appears more serosanguinous  Musculoskeletal: no edema or wounds, motor and sensation grossly intact, NT   Labs:  CBC Latest Ref Rng & Units 06/13/2021 06/12/2021 12/19/2018  WBC 4.0 - 10.5 K/uL 17.6(H) 18.1(H) 5.8  Hemoglobin 13.0 - 17.0 g/dL 12.8(L) 14.6 14.8  Hematocrit 39.0 - 52.0 % 37.4(L) 42.2 44.4  Platelets 150 - 400 K/uL 189 222 211.0   CMP Latest Ref Rng & Units 06/12/2021 12/19/2018 03/04/2018  Glucose 70 - 99 mg/dL 153(H) 105(H) -  BUN 8 - 23 mg/dL 17 20 -  Creatinine 0.61 - 1.24 mg/dL 0.93 0.83 0.90  Sodium 135 - 145 mmol/L 134(L) 140 -  Potassium 3.5 - 5.1 mmol/L 4.1 4.3 -  Chloride 98 - 111 mmol/L 103 103 -  CO2 22 - 32 mmol/L 23 29 -  Calcium 8.9 - 10.3 mg/dL 9.1 9.5 -  Total Protein 6.5 - 8.1 g/dL 8.3(H) 6.8 -  Total Bilirubin 0.3 - 1.2 mg/dL 0.7 0.5 -  Alkaline Phos 38 - 126 U/L 69 42 -  AST 15 - 41 U/L 23 18 -  ALT 0 - 44 U/L 20 17 -     Imaging studies: No new pertinent imaging studies   Assessment/Plan: (ICD-10's: K81.0) 80 y.o. male admitted with recurrent acute cholecystitis with abscess s/p percutaneous drain and cholecystostomy on 28/41, complicated by numerous pertinent comorbidities   - Appreciate IR assistance; monitor and record drain output daily  - ADAT   - IV Abx (Zosyn); follow up Cx  - Monitor abdominal examination - Pain control prn; antiemetics prn - Monitor leukocytosis; improved  - I will engage PT to ensure he does not need anything further for left hip  pain  - Further management per primary service; we will follow   - Discharge Planning: Patient would like 1 more day in the hospital, I think if he can tolerate diet advancement and leukocytosis continues to improve, he can be discharged home tomorrow (06/30).  All of the above findings and recommendations were discussed with the patient, patient's family (wife at bedside), and the medical team, and all of their questions were answered to their  expressed satisfaction.  -- Edison Simon, PA-C [facility] 06/14/2021, 7:25 AM [phone] M-F: 7am - 4pm  Marked improvement in leukocytosis after drains placed. I agree with the above documentation.

## 2021-06-14 NOTE — Plan of Care (Signed)

## 2021-06-14 NOTE — Evaluation (Signed)
Physical Therapy Evaluation Patient Details Name: Shawn Khan MRN: [id number] DOB: 1941/07/06 Today's Date: 06/14/2021   History of Present Illness  80 y.o. male seen in ed with complaints of  abdominal pain and seen by gen surg for percutaneous drainage. Pt seen in ed with c/o RUQ pain worse since past two days.Pain is worse with eating ? NR/ NO N/V. And reports it feels like his prior episode of cholecystitis in feb 2022. Pt had percutaneous cholecystostomy in 2/22, removed and now again 06/13/21.  I week prior to this admission pt had a fall with L hip injury, imaging negative but clearly with some soft tissue injury.  Clinical Impression  Pt did well with PT exam and though he did have some limp on the L, especially with initial few steps he was able to circumambukate the nurses' station with consistent gait (did rely on UEs/RW) and was able to negotiate up/down steps with single rail safely and w/o assist.  Pt agrees that he does not need further PT f/u here or in the home but will pay attention to hip pain and f/u with PCP/ortho if the situation does not improve.  Discussed progression to [facility] and appropriate usage.  Will complete PT orders, pt expects to d/c home tomorrow and is safe to do so.     Follow Up Recommendations No PT follow up (instructed pt to f/u with PCP/ortho if hip pain does not continue to improve with time)    Equipment Recommendations  None recommended by PT    Recommendations for Other Services       Precautions / Restrictions Precautions Precautions: Fall Restrictions Weight Bearing Restrictions: No      Mobility  Bed Mobility Overal bed mobility: Modified Independent             General bed mobility comments: pt up and out of bed on arrival    Transfers Overall transfer level: Modified independent               General transfer comment: reliant on UEs, but able to transition w/o assist.  Ambulation/Gait Ambulation/Gait assistance:  Supervision Gait Distance (Feet): 250 Feet Assistive device: Rolling walker (2 wheeled)       General Gait Details: Pt with some L hip limp on first few steps but with increased distance and assuring appropriate use of walker he was able to assume consistent cadence and much less pronounced limp.  Slower and more limited than baseline but safe and appropriate for home mobility, etc  Stairs Stairs: Yes Stairs assistance: Supervision Stair Management: One rail Left;Sideways Number of Stairs: 6 General stair comments: educated on and performed appropriate stair management for L hip pain, pt able to negotiate w/o physical assist, voices good understanding  Wheelchair Mobility    Modified Rankin (Stroke Patients Only)       Balance Overall balance assessment: Modified Independent                                           Pertinent Vitals/Pain Pain Assessment: 0-10 Pain Score: 4  Pain Location: L lateral hip    Home Living Family/patient expects to be discharged to:: Private residence Living Arrangements: Spouse/significant other Available Help at Discharge: Available 24 hours/day   Home Access: Stairs to enter;Ramped entrance Entrance Stairs-Rails: Left;Right Entrance Stairs-Number of Steps: 4 Home Layout: One level Home Equipment: [place] - 2 wheels;Cane -  single point;Bedside commode      Prior Function Level of Independence: Independent         Comments: Pt has needed RW recently 2/2 fall/L hip pain, but typically is active and independent     Hand Dominance        Extremity/Trunk Assessment   Upper Extremity Assessment Upper Extremity Assessment: Overall WFL for tasks assessed    Lower Extremity Assessment Lower Extremity Assessment: Overall WFL for tasks assessed (L hip abd and flexion pain hesitancy)       Communication   Communication: No difficulties  Cognition Arousal/Alertness: Awake/alert Behavior During Therapy: WFL for  tasks assessed/performed Overall Cognitive Status: Within Functional Limits for tasks assessed                                        General Comments      Exercises     Assessment/Plan    PT Assessment Patent does not need any further PT services  PT Problem List         PT Treatment Interventions      PT Goals (Current goals can be found in the Care Plan section)  Acute Rehab PT Goals Patient Stated Goal: go home PT Goal Formulation: All assessment and education complete, DC therapy    Frequency     Barriers to discharge        Co-evaluation               AM-PAC PT "6 Clicks" Mobility  Outcome Measure Help needed turning from your back to your side while in a flat bed without using bedrails?: None Help needed moving from lying on your back to sitting on the side of a flat bed without using bedrails?: None Help needed moving to and from a bed to a chair (including a wheelchair)?: None Help needed standing up from a chair using your arms (e.g., wheelchair or bedside chair)?: None Help needed to walk in hospital room?: None Help needed climbing 3-5 steps with a railing? : None 6 Click Score: 24    End of Session Equipment Utilized During Treatment: Gait belt Activity Tolerance: Patient limited by pain;Patient tolerated treatment well Patient left: in chair;with call bell/phone within reach Nurse Communication: Mobility status PT Visit Diagnosis: Difficulty in walking, not elsewhere classified (R26.2);Pain Pain - Right/Left: Left Pain - part of body: Hip    Time: 8527-7824 PT Time Calculation (min) (ACUTE ONLY): 33 min   Charges:   PT Evaluation $PT Eval Low Complexity: 1 Low PT Treatments $Gait Training: 8-22 mins        Kreg Shropshire, DPT 06/14/2021, 10:08 AM

## 2021-06-15 MED ORDER — OXYCODONE HCL 5 MG PO TABS: 5.0000 mg | ORAL_TABLET | Freq: Four times a day (QID) | ORAL | 0 refills | Status: AC | PRN

## 2021-06-15 MED ORDER — MORPHINE SULFATE (PF) 2 MG/ML IV SOLN
1.0000 mg | INTRAVENOUS | Status: DC | PRN
  Administered 2021-06-15 (×3): 1 mg via INTRAVENOUS
  Filled 2021-06-15 (×3): qty 1

## 2021-06-15 MED ORDER — SENNOSIDES-DOCUSATE SODIUM 8.6-50 MG PO TABS
1.0000 | ORAL_TABLET | Freq: Two times a day (BID) | ORAL | Status: DC
  Administered 2021-06-15: 1 via ORAL
  Filled 2021-06-15: qty 1

## 2021-06-15 MED ORDER — AMOXICILLIN-POT CLAVULANATE 875-125 MG PO TABS: 1.0000 | ORAL_TABLET | Freq: Two times a day (BID) | ORAL | 0 refills | Status: AC

## 2021-06-15 NOTE — Care Management Important Message (Signed)
Important Message  Patient Details  Name: Shawn Khan MRN: [id number] Date of Birth: [birth year]   Medicare Important Message Given:  Yes     [name] 06/15/2021, 12:54 PM

## 2021-06-15 NOTE — Progress Notes (Addendum)
[facility] SURGICAL PROGRESS NOTE (cpt [phone])  Hospital Day(s): 3.   Interval History: Patient seen and examined, no acute events or new complaints overnight. Patient reports he had a rough night secondary to discomfort from the RUQ drains, but this is improved this morning. Some discomfort with deep inspiration. He denies fever, chills, nausea, emesis. Leukocytosis resolved yesterday (8.4K). Cx from abscess growing [place]. Cx from gallbladder growing GNR & GPC in pairs. He continues on Zosyn. Tolerating regular diet without issue. Ambulated with PT without issues.   Review of Systems:  Constitutional: denies fever, chills  HEENT: denies cough or congestion  Respiratory: denies any shortness of breath  Cardiovascular: denies chest pain or palpitations  Gastrointestinal: + abdominal pain, denied N/V, or diarrhea/and bowel function as per interval history Genitourinary: denies burning with urination or urinary frequency  Vital signs in last 24 hours: [min-max] current  Temp:  [97.7 F (36.5 C)-98.9 F (37.2 C)] 98.9 F (37.2 C) (06/30 0347) Pulse Rate:  [58-71] 71 (06/30 0347) Resp:  [17-18] 18 (06/30 0347) BP: (109-129)/(58-80) 121/64 (06/30 0347) SpO2:  [94 %-98 %] 94 % (06/30 0347)     Height: 5\' 9"  (175.3 cm) Weight: 89 kg BMI (Calculated): 28.98   Intake/Output last 2 shifts:  06/29 0701 - 06/30 0700 In: 240 [P.O.:240] Out: 1160 [Urine:1000; Drains:160]   Physical Exam:  Constitutional: alert, cooperative and no distress HENT: normocephalic without obvious abnormality Eyes: PERRL, EOM's grossly intact and symmetric Respiratory: breathing non-labored at rest Cardiovascular: regular rate and sinus rhythm Gastrointestinal: Soft, non-tender, non-distended, no rebound/guarding. Percutaneous cholecystostomy in the RUQ, output clearing, more bilious. Abscess drain in RUQ; output appears serous in tubing this morning Musculoskeletal: no edema or wounds, motor and  sensation grossly intact, NT   Labs:  CBC Latest Ref Rng & Units 06/14/2021 06/13/2021 06/12/2021  WBC 4.0 - 10.5 K/uL 8.4 17.6(H) 18.1(H)  Hemoglobin 13.0 - 17.0 g/dL 12.8(L) 12.8(L) 14.6  Hematocrit 39.0 - 52.0 % 37.9(L) 37.4(L) 42.2  Platelets 150 - 400 K/uL 191 189 222   CMP Latest Ref Rng & Units 06/14/2021 06/12/2021 12/19/2018  Glucose 70 - 99 mg/dL 148(H) 153(H) 105(H)  BUN 8 - 23 mg/dL 18 17 20   Creatinine 0.61 - 1.24 mg/dL 0.95 0.93 0.83  Sodium 135 - 145 mmol/L 134(L) 134(L) 140  Potassium 3.5 - 5.1 mmol/L 3.6 4.1 4.3  Chloride 98 - 111 mmol/L 104 103 103  CO2 22 - 32 mmol/L 24 23 29   Calcium 8.9 - 10.3 mg/dL 8.3(L) 9.1 9.5  Total Protein 6.5 - 8.1 g/dL - 8.3(H) 6.8  Total Bilirubin 0.3 - 1.2 mg/dL - 0.7 0.5  Alkaline Phos 38 - 126 U/L - 69 42  AST 15 - 41 U/L - 23 18  ALT 0 - 44 U/L - 20 17    Imaging studies: No new pertinent imaging studies   Assessment/Plan: (ICD-10's: K81.0) 80 y.o. male admitted with recurrent acute cholecystitis with abscess s/p percutaneous drain and cholecystostomy on 10/27, complicated by numerous pertinent comorbidities               - Appreciate IR assistance; monitor and record drain output daily; handouts provided for discharge              - Continue regular diet             - IV Abx (Zosyn); Augmentin PO for home x7 days: follow up Cx; - Monitor abdominal examination - Pain control prn; antiemetics prn -  Monitor leukocytosis; resolved             - Further management per primary service; we will follow               - Discharge Planning: Okay for discharge from surgical standpoint; follow up in 1 week for abscess drain removal, cholecystostomy needs to stay for 6-8 weeks, Abx as above.   All of the above findings and recommendations were discussed with the patient, and the medical team, and all of patient's questions were answered to his expressed satisfaction.  -- Edison Simon, PA-C [facility] 06/15/2021, 7:18  AM [phone] M-F: 7am - 4pm   I saw and evaluated the patient.  I agree with the above documentation, exam, and plan, which I have edited where appropriate. Fredirick Maudlin  1:09 PM

## 2021-06-15 NOTE — Plan of Care (Signed)
  Problem: Education: Goal: Knowledge of General Education information will improve Description: Including pain rating scale, medication(s)/side effects and non-pharmacologic comfort measures Outcome: Adequate for Discharge   Problem: Health Behavior/Discharge Planning: Goal: Ability to manage health-related needs will improve Outcome: Adequate for Discharge   Problem: Clinical Measurements: Goal: Ability to maintain clinical measurements within normal limits will improve Outcome: Adequate for Discharge Goal: Respiratory complications will improve Outcome: Adequate for Discharge Goal: Cardiovascular complication will be avoided Outcome: Adequate for Discharge   Problem: Activity: Goal: Risk for activity intolerance will decrease 06/15/2021 1315 by Basilio Cairo, RN Outcome: Adequate for Discharge 06/15/2021 1315 by Basilio Cairo, RN Outcome: Adequate for Discharge   Problem: Nutrition: Goal: Adequate nutrition will be maintained Outcome: Adequate for Discharge   Problem: Coping: Goal: Level of anxiety will decrease Outcome: Adequate for Discharge   Problem: Elimination: Goal: Will not experience complications related to bowel motility Outcome: Adequate for Discharge Goal: Will not experience complications related to urinary retention Outcome: Adequate for Discharge   Problem: Pain Managment: Goal: General experience of comfort will improve Outcome: Adequate for Discharge   Problem: Safety: Goal: Ability to remain free from injury will improve Outcome: Adequate for Discharge   Problem: Skin Integrity: Goal: Risk for impaired skin integrity will decrease Outcome: Adequate for Discharge

## 2021-06-15 NOTE — Discharge Summary (Signed)
[place] [facility] [place] NAME: Shawn Khan    MR#:  [id number]  DATE OF BIRTH:  Dec 22, 1940  DATE OF ADMISSION:  06/12/2021 ADMITTING PHYSICIAN: Para Skeans, MD  DATE OF DISCHARGE: 06/15/2021  PRIMARY CARE PHYSICIAN: Idelle Crouch, MD    ADMISSION DIAGNOSIS:  Acute cholecystitis [K81.0] Cholecystitis [K81.9] Intra-abdominal abscess ([place]) [K65.1]  DISCHARGE DIAGNOSIS:  recurrent acute cholecystitis/gallbladder abscess--  status post percutaneous cholecystostomy and abscess drained by IR  SECONDARY DIAGNOSIS:   Past Medical History:  Diagnosis Date   Arthritis    Chronic hoarseness    Complication of anesthesia    h/o nasal intubation x 1 hoarseness x 1 week   Contusion of chest    Cough    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   Gastritis    GERD (gastroesophageal reflux disease)    Hemorrhagic stroke ([place])    more than 5 years ago   History of chicken pox    Hyperlipidemia    did not like crestor    Hypertension    Hypoglycemia    Hypogonadism in male    Insomnia    Low testosterone in male    Osteoporosis    Osteoporosis    Prediabetes    Prediabetes    A1C 5.9 06/25/18    Rotator cuff disorder    Sleep apnea    cpap   Stroke [facility])    Vocal cord dysfunction    in 2018 damage per pt    Vocal cord nodule    Zenker diverticulum    1.7 cm    Zenker diverticulum     HOSPITAL COURSE:   Shawn Khan is a 80 y.o. male with hx/o GERD, hemorrhagic stroke, hyperlipidemia, hypertension, osteoporosis, prediabetes, sleep apnea, vocal cord dysfunction, Barrett's esophagus, hypogonadism. presented with abdominal pain   Recurrent cholecystitis Gallbladder abscess --General surgery  input is appreciated. --Patient is status post percutaneous cholecystostomy and abscess drained by IR on 6/27 --Resume diet now on heart healthy --Pain control --Continue Zosyn-- de-escalate to oral Augmentin per surgery -- white count down to  8.4 (17K) -- patient clinically improving. Has some pain right UQ drain site   GERD / Barrett's esophagus: On PPI   Lt hip pain-s/p fall last week Will place on lidocaine patch --left hip x-ray--no fracture -- patient ambulated with physical therapy without difficulty.   OSA: Cpap per home settings.     Overall improving. Has intermittent right UQ pain. Vitals ok. Per surgery ok to discharge with outpatient follow-up next week   DVT prophylaxis: SCD Code Status: Full Family Communication: Wife at bedside on 6/30 Disposition Plan:  Status is: Inpatient     Dispo: The patient is from: Home              Anticipated d/c is to: Home              Patient currently is medically stable to d/c.               Difficult to place patient No       CONSULTS OBTAINED:  Treatment Team:  Fredirick Maudlin, MD  DRUG ALLERGIES:  No Known Allergies  DISCHARGE MEDICATIONS:   Allergies as of 06/15/2021   No Known Allergies      Medication List     TAKE these medications    alendronate 70 MG tablet Commonly known as: FOSAMAX Take 70 mg by mouth once a week. Take with a full  glass of water on an empty stomach.   amoxicillin-clavulanate 875-125 MG tablet Commonly known as: Augmentin Take 1 tablet by mouth 2 (two) times daily for 7 days.   ascorbic acid 500 MG tablet Commonly known as: VITAMIN C Take 500 mg by mouth daily.   cholecalciferol 25 MCG (1000 UNIT) tablet Commonly known as: VITAMIN D3 Take 1,000 Units by mouth daily.   clotrimazole-betamethasone cream Commonly known as: LOTRISONE Apply 1 application topically 2 (two) times daily. (Apply to the genital area as directed)   oxyCODONE 5 MG immediate release tablet Commonly known as: Oxy IR/ROXICODONE Take 1 tablet (5 mg total) by mouth every 6 (six) hours as needed for severe pain or breakthrough pain.   tamsulosin 0.4 MG Caps capsule Commonly known as: FLOMAX Take 1 capsule (0.4 mg total) by mouth 2 (two) times  daily. appt further refills   venlafaxine XR 37.5 MG 24 hr capsule Commonly known as: EFFEXOR-XR Take 37.5 mg by mouth daily.   vitamin B-12 1000 MCG tablet Commonly known as: CYANOCOBALAMIN Take 1,000 mcg by mouth daily.   zinc sulfate 220 (50 Zn) MG capsule Take 220 mg by mouth daily.               Discharge Care Instructions  (From admission, onward)           Start     Ordered   06/15/21 0000  Discharge wound care:       Comments: Right UQ drain dressing change per surgery instructions   06/15/21 0850            If you experience worsening of your admission symptoms, develop shortness of breath, life threatening emergency, suicidal or homicidal thoughts you must seek medical attention immediately by calling 911 or calling your MD immediately  if symptoms less severe.  You Must read complete instructions/literature along with all the possible adverse reactions/side effects for all the Medicines you take and that have been prescribed to you. Take any new Medicines after you have completely understood and accept all the possible adverse reactions/side effects.   Please note  You were cared for by a hospitalist during your hospital stay. If you have any questions about your discharge medications or the care you received while you were in the hospital after you are discharged, you can call the unit and asked to speak with the hospitalist on call if the hospitalist that took care of you is not available. Once you are discharged, your primary care physician will handle any further medical issues. Please note that NO REFILLS for any discharge medications will be authorized once you are discharged, as it is imperative that you return to your primary care physician (or establish a relationship with a primary care physician if you do not have one) for your aftercare needs so that they can reassess your need for medications and monitor your lab values. Today   SUBJECTIVE   Last nite had right UQ pain. Better now. Wants to have BM./ eating regular consistency diet No fever   VITAL SIGNS:  Blood pressure 132/84, pulse 72, temperature 98.1 F (36.7 C), resp. rate 18, height 5\' 9"  (1.753 m), weight 89 kg, SpO2 95 %.  I/O:   Intake/Output Summary (Last 24 hours) at 06/15/2021 0850 Last data filed at 06/15/2021 0700 Gross per 24 hour  Intake 240 ml  Output 1735 ml  Net -1495 ml    PHYSICAL EXAMINATION:  GENERAL:  80 y.o.-year-old patient lying in the bed with  no acute distress.  LUNGS: Normal breath sounds bilaterally, no wheezing, rales,rhonchi or crepitation. No use of accessory muscles of respiration.  CARDIOVASCULAR: S1, S2 normal. No murmurs, rubs, or gallops.  ABDOMEN: Soft, non-tender, non-distended. Bowel sounds present. No organomegaly or mass. Right UQ 2 drains+ EXTREMITIES: No pedal edema, cyanosis, or clubbing.  NEUROLOGIC: non focal PSYCHIATRIC: patient is alert and oriented x 3.  SKIN: No obvious rash, lesion, or ulcer.   DATA REVIEW:   CBC  Recent Labs  Lab 06/14/21 0932  WBC 8.4  HGB 12.8*  HCT 37.9*  PLT 191    Chemistries  Recent Labs  Lab 06/12/21 0915 06/14/21 0932  NA 134* 134*  K 4.1 3.6  CL 103 104  CO2 23 24  GLUCOSE 153* 148*  BUN 17 18  CREATININE 0.93 0.95  CALCIUM 9.1 8.3*  AST 23  --   ALT 20  --   ALKPHOS 69  --   BILITOT 0.7  --     Microbiology Results   Recent Results (from the past 240 hour(s))  Blood culture (single)     Status: None (Preliminary result)   Collection Time: 06/12/21 12:05 PM   Specimen: BLOOD LEFT FOREARM  Result Value Ref Range Status   Specimen Description BLOOD LEFT FOREARM  Final   Special Requests   Final    BOTTLES DRAWN AEROBIC AND ANAEROBIC Blood Culture adequate volume   Culture  Setup Time PENDING  Incomplete   Culture   Final    NO GROWTH 3 DAYS Performed at [facility], [address], [place], [place] [address]    Report Status PENDING   Incomplete  Resp Panel by RT-PCR (Flu A&B, Covid) Nasopharyngeal Swab     Status: None   Collection Time: 06/12/21  2:13 PM   Specimen: Nasopharyngeal Swab; Nasopharyngeal(NP) swabs in vial transport medium  Result Value Ref Range Status   SARS Coronavirus 2 by RT PCR NEGATIVE NEGATIVE Final    Comment: (NOTE) SARS-CoV-2 target nucleic acids are NOT DETECTED.  The SARS-CoV-2 RNA is generally detectable in upper respiratory specimens during the acute phase of infection. The lowest concentration of SARS-CoV-2 viral copies this assay can detect is 138 copies/mL. A negative result does not preclude SARS-Cov-2 infection and should not be used as the sole basis for treatment or other patient management decisions. A negative result may occur with  improper specimen collection/handling, submission of specimen other than nasopharyngeal swab, presence of viral mutation(s) within the areas targeted by this assay, and inadequate number of viral copies(<138 copies/mL). A negative result must be combined with clinical observations, patient history, and epidemiological information. The expected result is Negative.  Fact Sheet for Patients:  EntrepreneurPulse.com.au  Fact Sheet for Healthcare Providers:  IncredibleEmployment.be  This test is no t yet approved or cleared by the [place] FDA and  has been authorized for detection and/or diagnosis of SARS-CoV-2 by FDA under an Emergency Use Authorization (EUA). This EUA will remain  in effect (meaning this test can be used) for the duration of the COVID-19 declaration under Section 564(b)(1) of the Act, 21 U.S.C.section 360bbb-3(b)(1), unless the authorization is terminated  or revoked sooner.       Influenza A by PCR NEGATIVE NEGATIVE Final   Influenza B by PCR NEGATIVE NEGATIVE Final    Comment: (NOTE) The Xpert Xpress SARS-CoV-2/FLU/RSV plus assay is intended as an aid in the diagnosis of influenza  from Nasopharyngeal swab specimens and should not be used as a  sole basis for treatment. Nasal washings and aspirates are unacceptable for Xpert Xpress SARS-CoV-2/FLU/RSV testing.  Fact Sheet for Patients: EntrepreneurPulse.com.au  Fact Sheet for Healthcare Providers: IncredibleEmployment.be  This test is not yet approved or cleared by the [place] FDA and has been authorized for detection and/or diagnosis of SARS-CoV-2 by FDA under an Emergency Use Authorization (EUA). This EUA will remain in effect (meaning this test can be used) for the duration of the COVID-19 declaration under Section 564(b)(1) of the Act, 21 U.S.C. section 360bbb-3(b)(1), unless the authorization is terminated or revoked.  Performed at [facility], [address] [place], [place] [address]   Aerobic/Anaerobic Culture w Gram Stain (surgical/deep wound)     Status: None (Preliminary result)   Collection Time: 06/13/21 11:23 AM   Specimen: Liver; Abscess  Result Value Ref Range Status   Specimen Description   Final    LIVER Performed at [facility], [address] [place], [place] [address]    Special Requests   Final    NONE Performed at [facility], [address] [place], [place] [address]    Gram Stain   Final    ABUNDANT WBC PRESENT, PREDOMINANTLY PMN FEW GRAM NEGATIVE RODS RARE GRAM POSITIVE COCCI    Culture   Final    CULTURE REINCUBATED FOR BETTER GROWTH Performed at [facility], [place] [address] [place], [place] [address]    Report Status PENDING  Incomplete  Aerobic/Anaerobic Culture w Gram Stain (surgical/deep wound)     Status: None (Preliminary result)   Collection Time: 06/13/21 11:44 AM   Specimen: Gallbladder; Bile  Result Value Ref Range Status   Specimen Description   Final    GALL BLADDER Performed at [facility], [address] [place], [place] [address]    Special Requests   Final     NONE Performed at [facility], [address] [place], [place] [address]    Gram Stain   Final    MODERATE WBC PRESENT, PREDOMINANTLY PMN MODERATE GRAM NEGATIVE RODS MODERATE GRAM POSITIVE COCCI IN PAIRS IN CLUSTERS    Culture   Final    CULTURE REINCUBATED FOR BETTER GROWTH Performed at [facility], [place] [address] [place], [place] [address]    Report Status PENDING  Incomplete    RADIOLOGY:  [place] Intraoperative  Result Date: 06/13/2021 INDICATION: 80 year old male with recurrent acute cholecystitis after prior percutaneous cholecystostomy and drainage status post drain removal. On this admission he has intrahepatic abscess complicating the acute cholecystitis EXAM: IMAGE GUIDED PERCUTANEOUS CHOLECYSTOSTOMY IMAGE GUIDED DRAINAGE OF INTRAHEPATIC ABSCESS MEDICATIONS: None ANESTHESIA/SEDATION: Moderate (conscious) sedation was employed during this procedure. A total of Versed 1.5 mg and Fentanyl 75 mcg was administered intravenously. Moderate Sedation Time: 19 minutes. The patient's level of consciousness and vital signs were monitored continuously by radiology nursing throughout the procedure under my direct supervision. FLUOROSCOPY TIME:  Fluoroscopy Time: 0 minutes 6 seconds (1.9 mGy). COMPLICATIONS: None PROCEDURE: Informed written consent was obtained from the patient and the patient's family after a thorough discussion of the procedural risks, benefits and alternatives. All questions were addressed. Maximal Sterile Barrier Technique was utilized including caps, mask, sterile gowns, sterile gloves, sterile drape, hand hygiene and skin antiseptic. A timeout was performed prior to the initiation of the procedure. Ultrasound survey of the right upper quadrant was performed for planning purposes. Once the patient is prepped and draped in the usual sterile fashion, the skin and subcutaneous tissues overlying  the gallbladder were generously infiltrated 1% lidocaine for local  anesthesia. A coaxial needle was advanced under ultrasound guidance through the skin subcutaneous tissues and a small segment of liver into the gallbladder lumen. With removal of the stylet, spontaneous bile drainage occurred. The 018 Mandril wire was advanced under x-ray guidance into the gallbladder lumen. At this point, the percutaneous drainage was performed. 1% lidocaine was used for local anesthesia. Small stab incision was made with 11 blade scalpel. Tract dilation was performed with a hemostat. Using ultrasound guidance, trocar technique was used to place a 10 [place] drain into the abscess within the liver adjacent to the gallbladder fossa. Once the drain was in position aspiration of frankly purulent material confirmed location. Pigtail catheter was formed and the retention suture was ligated Using modified Seldinger technique, we then placed a 10 French drain was placed into the gallbladder fossa, with aspiration of bile the sample for the lab. Contrast injection confirmed position of the tube within the gallbladder lumen. The abscess drain was attached to bulb suction and sutured in position. The percutaneous cholecystostomy drain was attached to gravity and sutured in position. Patient tolerated the procedure well and remained hemodynamically stable throughout. No complications were encountered and no significant blood loss encountered. FINDINGS: Drain labeled 1 into the hepatic abscess, 10 French, to bulb suction, with a sample labeled 1 sent to the lab. Drain labeled 2 into the gallbladder, 10 [place], to gravity drain, with a sample labeled 2 sent to the lab. IMPRESSION: Status post image guided placement of percutaneous cholecystostomy as well as image guided drainage of intrahepatic abscess. Signed, Dulcy Fanny. Dellia Nims, RPVI [facility] Radiology Electronically Signed   By: Corrie Mckusick D.O.   On: 06/13/2021 12:15   IR Guided Drain W Catheter  Placement  Result Date: 06/13/2021 INDICATION: 80 year old male with recurrent acute cholecystitis after prior percutaneous cholecystostomy and drainage status post drain removal. On this admission he has intrahepatic abscess complicating the acute cholecystitis EXAM: IMAGE GUIDED PERCUTANEOUS CHOLECYSTOSTOMY IMAGE GUIDED DRAINAGE OF INTRAHEPATIC ABSCESS MEDICATIONS: None ANESTHESIA/SEDATION: Moderate (conscious) sedation was employed during this procedure. A total of Versed 1.5 mg and Fentanyl 75 mcg was administered intravenously. Moderate Sedation Time: 19 minutes. The patient's level of consciousness and vital signs were monitored continuously by radiology nursing throughout the procedure under my direct supervision. FLUOROSCOPY TIME:  Fluoroscopy Time: 0 minutes 6 seconds (1.9 mGy). COMPLICATIONS: None PROCEDURE: Informed written consent was obtained from the patient and the patient's family after a thorough discussion of the procedural risks, benefits and alternatives. All questions were addressed. Maximal Sterile Barrier Technique was utilized including caps, mask, sterile gowns, sterile gloves, sterile drape, hand hygiene and skin antiseptic. A timeout was performed prior to the initiation of the procedure. Ultrasound survey of the right upper quadrant was performed for planning purposes. Once the patient is prepped and draped in the usual sterile fashion, the skin and subcutaneous tissues overlying the gallbladder were generously infiltrated 1% lidocaine for local anesthesia. A coaxial needle was advanced under ultrasound guidance through the skin subcutaneous tissues and a small segment of liver into the gallbladder lumen. With removal of the stylet, spontaneous bile drainage occurred. The 018 Mandril wire was advanced under x-ray guidance into the gallbladder lumen. At this point, the percutaneous drainage was performed. 1% lidocaine was used for local anesthesia. Small stab incision was made with 11  blade scalpel. Tract dilation was performed with a hemostat. Using ultrasound guidance, trocar technique was used  to place a 10 [place] drain into the abscess within the liver adjacent to the gallbladder fossa. Once the drain was in position aspiration of frankly purulent material confirmed location. Pigtail catheter was formed and the retention suture was ligated Using modified Seldinger technique, we then placed a 10 French drain was placed into the gallbladder fossa, with aspiration of bile the sample for the lab. Contrast injection confirmed position of the tube within the gallbladder lumen. The abscess drain was attached to bulb suction and sutured in position. The percutaneous cholecystostomy drain was attached to gravity and sutured in position. Patient tolerated the procedure well and remained hemodynamically stable throughout. No complications were encountered and no significant blood loss encountered. FINDINGS: Drain labeled 1 into the hepatic abscess, 10 French, to bulb suction, with a sample labeled 1 sent to the lab. Drain labeled 2 into the gallbladder, 10 [place], to gravity drain, with a sample labeled 2 sent to the lab. IMPRESSION: Status post image guided placement of percutaneous cholecystostomy as well as image guided drainage of intrahepatic abscess. Signed, Dulcy Fanny. Dellia Nims, RPVI [facility] Radiology Electronically Signed   By: Corrie Mckusick D.O.   On: 06/13/2021 12:15   IR Perc Cholecystostomy  Result Date: 06/13/2021 INDICATION: 80 year old male with recurrent acute cholecystitis after prior percutaneous cholecystostomy and drainage status post drain removal. On this admission he has intrahepatic abscess complicating the acute cholecystitis EXAM: IMAGE GUIDED PERCUTANEOUS CHOLECYSTOSTOMY IMAGE GUIDED DRAINAGE OF INTRAHEPATIC ABSCESS MEDICATIONS: None ANESTHESIA/SEDATION: Moderate (conscious) sedation was employed during this procedure. A  total of Versed 1.5 mg and Fentanyl 75 mcg was administered intravenously. Moderate Sedation Time: 19 minutes. The patient's level of consciousness and vital signs were monitored continuously by radiology nursing throughout the procedure under my direct supervision. FLUOROSCOPY TIME:  Fluoroscopy Time: 0 minutes 6 seconds (1.9 mGy). COMPLICATIONS: None PROCEDURE: Informed written consent was obtained from the patient and the patient's family after a thorough discussion of the procedural risks, benefits and alternatives. All questions were addressed. Maximal Sterile Barrier Technique was utilized including caps, mask, sterile gowns, sterile gloves, sterile drape, hand hygiene and skin antiseptic. A timeout was performed prior to the initiation of the procedure. Ultrasound survey of the right upper quadrant was performed for planning purposes. Once the patient is prepped and draped in the usual sterile fashion, the skin and subcutaneous tissues overlying the gallbladder were generously infiltrated 1% lidocaine for local anesthesia. A coaxial needle was advanced under ultrasound guidance through the skin subcutaneous tissues and a small segment of liver into the gallbladder lumen. With removal of the stylet, spontaneous bile drainage occurred. The 018 Mandril wire was advanced under x-ray guidance into the gallbladder lumen. At this point, the percutaneous drainage was performed. 1% lidocaine was used for local anesthesia. Small stab incision was made with 11 blade scalpel. Tract dilation was performed with a hemostat. Using ultrasound guidance, trocar technique was used to place a 10 [place] drain into the abscess within the liver adjacent to the gallbladder fossa. Once the drain was in position aspiration of frankly purulent material confirmed location. Pigtail catheter was formed and the retention suture was ligated Using modified Seldinger technique, we then placed a 10 French drain was placed into the gallbladder  fossa, with aspiration of bile the sample for the lab. Contrast injection confirmed position of the tube within the gallbladder lumen. The abscess drain was attached to bulb suction and sutured in position. The percutaneous cholecystostomy drain was attached to gravity  and sutured in position. Patient tolerated the procedure well and remained hemodynamically stable throughout. No complications were encountered and no significant blood loss encountered. FINDINGS: Drain labeled 1 into the hepatic abscess, 10 French, to bulb suction, with a sample labeled 1 sent to the lab. Drain labeled 2 into the gallbladder, 10 [place], to gravity drain, with a sample labeled 2 sent to the lab. IMPRESSION: Status post image guided placement of percutaneous cholecystostomy as well as image guided drainage of intrahepatic abscess. Signed, Dulcy Fanny. Dellia Nims, RPVI [facility] Radiology Electronically Signed   By: Corrie Mckusick D.O.   On: 06/13/2021 12:15   DG HIP UNILAT WITH PELVIS 2-3 VIEWS LEFT  Result Date: 06/13/2021 CLINICAL DATA:  [place] 1 week ago.  Left hip pain. EXAM: DG HIP (WITH OR WITHOUT PELVIS) 2-3V LEFT COMPARISON:  None. FINDINGS: No acute left hip abnormality. No degenerative change or fracture. No acute pelvic fracture. Old right femoral nail. IMPRESSION: No acute radiographic finding.  Left hip appears negative. Electronically Signed   By: Nelson Chimes M.D.   On: 06/13/2021 15:44     CODE STATUS:     Code Status Orders  (From admission, onward)           Start     Ordered   06/12/21 1637  Full code  Continuous        06/12/21 1636           Code Status History     This patient has a current code status but no historical code status.        TOTAL TIME TAKING CARE OF THIS PATIENT: 35 minutes.    Fritzi Mandes M.D  [facility]    CC: Primary care physician; Idelle Crouch, MD

## 2021-06-15 NOTE — Plan of Care (Signed)
  Problem: Education: Goal: Knowledge of General Education information will improve Description: Including pain rating scale, medication(s)/side effects and non-pharmacologic comfort measures Outcome: Adequate for Discharge   Problem: Health Behavior/Discharge Planning: Goal: Ability to manage health-related needs will improve Outcome: Adequate for Discharge   Problem: Clinical Measurements: Goal: Ability to maintain clinical measurements within normal limits will improve Outcome: Adequate for Discharge Goal: Will remain free from infection Outcome: Adequate for Discharge Goal: Diagnostic test results will improve Outcome: Adequate for Discharge Goal: Respiratory complications will improve Outcome: Adequate for Discharge Goal: Cardiovascular complication will be avoided Outcome: Adequate for Discharge   Problem: Activity: Goal: Risk for activity intolerance will decrease 06/15/2021 1315 by Basilio Cairo, RN Outcome: Adequate for Discharge 06/15/2021 1315 by Basilio Cairo, RN Outcome: Adequate for Discharge   Problem: Nutrition: Goal: Adequate nutrition will be maintained Outcome: Adequate for Discharge   Problem: Coping: Goal: Level of anxiety will decrease Outcome: Adequate for Discharge   Problem: Elimination: Goal: Will not experience complications related to bowel motility Outcome: Adequate for Discharge Goal: Will not experience complications related to urinary retention Outcome: Adequate for Discharge   Problem: Pain Managment: Goal: General experience of comfort will improve 06/15/2021 1345 by Basilio Cairo, RN Outcome: Adequate for Discharge 06/15/2021 1315 by Basilio Cairo, RN Outcome: Adequate for Discharge   Problem: Safety: Goal: Ability to remain free from injury will improve 06/15/2021 1345 by Basilio Cairo, RN Outcome: Adequate for Discharge 06/15/2021 1315 by Basilio Cairo, RN Outcome: Adequate for Discharge   Problem:  Skin Integrity: Goal: Risk for impaired skin integrity will decrease Outcome: Adequate for Discharge

## 2021-06-17 ENCOUNTER — Other Ambulatory Visit: Payer: Self-pay

## 2021-06-17 ENCOUNTER — Emergency Department
Admission: EM | Admit: 2021-06-17 | Discharge: 2021-06-17 | Disposition: A | Discharge status: Home / Self Care | Payer: Medicare Other | Attending: Emergency Medicine | Admitting: Emergency Medicine

## 2021-06-17 ENCOUNTER — Emergency Department: Payer: Medicare Other

## 2021-06-17 DIAGNOSIS — I10 Essential (primary) hypertension: Secondary | ICD-10-CM | POA: Insufficient documentation

## 2021-06-17 DIAGNOSIS — K668 Other specified disorders of peritoneum: Secondary | ICD-10-CM

## 2021-06-17 DIAGNOSIS — Z4803 Encounter for change or removal of drains: Secondary | ICD-10-CM | POA: Diagnosis not present

## 2021-06-17 DIAGNOSIS — K838 Other specified diseases of biliary tract: Secondary | ICD-10-CM | POA: Insufficient documentation

## 2021-06-17 DIAGNOSIS — Z79899 Other long term (current) drug therapy: Secondary | ICD-10-CM | POA: Insufficient documentation

## 2021-06-17 DIAGNOSIS — Z4682 Encounter for fitting and adjustment of non-vascular catheter: Secondary | ICD-10-CM

## 2021-06-17 LAB — HEPATIC FUNCTION PANEL
ALT: 33 U/L (ref 0–44)
AST: 24 U/L (ref 15–41)
Albumin: 3 g/dL — ABNORMAL LOW (ref 3.5–5.0)
Alkaline Phosphatase: 59 U/L (ref 38–126)
Bilirubin, Direct: <0.1 mg/dL (ref 0.0–0.2)
Total Bilirubin: 0.6 mg/dL (ref 0.3–1.2)
Total Protein: 7.5 g/dL (ref 6.5–8.1)

## 2021-06-17 LAB — CBC WITH DIFFERENTIAL/PLATELET
Abs Immature Granulocytes: 0.08 10*3/uL — ABNORMAL HIGH (ref 0.00–0.07)
Basophils Absolute: 0.1 10*3/uL (ref 0.0–0.1)
Basophils Relative: 0 %
Eosinophils Absolute: 0.2 10*3/uL (ref 0.0–0.5)
Eosinophils Relative: 2 %
HCT: 42.2 % (ref 39.0–52.0)
Hemoglobin: 14.2 g/dL (ref 13.0–17.0)
Immature Granulocytes: 1 %
Lymphocytes Relative: 9 %
Lymphs Abs: 1.2 10*3/uL (ref 0.7–4.0)
MCH: 31.5 pg (ref 26.0–34.0)
MCHC: 33.6 g/dL (ref 30.0–36.0)
MCV: 93.6 fL (ref 80.0–100.0)
Monocytes Absolute: 1.5 10*3/uL — ABNORMAL HIGH (ref 0.1–1.0)
Monocytes Relative: 11 %
Neutro Abs: 10.4 10*3/uL — ABNORMAL HIGH (ref 1.7–7.7)
Neutrophils Relative %: 77 %
Platelets: 253 10*3/uL (ref 150–400)
RBC: 4.51 MIL/uL (ref 4.22–5.81)
RDW: 13.2 % (ref 11.5–15.5)
WBC: 13.6 10*3/uL — ABNORMAL HIGH (ref 4.0–10.5)
nRBC: 0 % (ref 0.0–0.2)

## 2021-06-17 LAB — BASIC METABOLIC PANEL
Anion gap: 7 (ref 5–15)
BUN: 18 mg/dL (ref 8–23)
CO2: 27 mmol/L (ref 22–32)
Calcium: 8.6 mg/dL — ABNORMAL LOW (ref 8.9–10.3)
Chloride: 97 mmol/L — ABNORMAL LOW (ref 98–111)
Creatinine, Ser: 1.01 mg/dL (ref 0.61–1.24)
GFR, Estimated: >60 mL/min (ref >60)
Glucose, Bld: 169 mg/dL — ABNORMAL HIGH (ref 70–99)
Potassium: 3.6 mmol/L (ref 3.5–5.1)
Sodium: 131 mmol/L — ABNORMAL LOW (ref 135–145)

## 2021-06-17 LAB — TROPONIN I (HIGH SENSITIVITY): Troponin I (High Sensitivity): 7 ng/L (ref <18)

## 2021-06-17 LAB — LIPASE, BLOOD: Lipase: 34 U/L (ref 11–51)

## 2021-06-17 MED ORDER — OXYCODONE-ACETAMINOPHEN 5-325 MG PO TABS
1.0000 | ORAL_TABLET | Freq: Once | ORAL | Status: AC
  Administered 2021-06-17: 1 via ORAL
  Filled 2021-06-17: qty 1

## 2021-06-17 NOTE — ED Triage Notes (Signed)
Pt was d/c a few dys ago after admission where pt had tube placed in gallbladder and another placed in abscess above gallbladder, pt states that the drainage had been red but today switched to a dark green. Pt states he has pain at the site of drain when he coughs or sneezes, not new today.

## 2021-06-17 NOTE — ED Provider Notes (Signed)
[facility] Emergency Department Provider Note   ____________________________________________   Event Date/Time   First MD Initiated Contact with Patient 06/17/21 [id number]     (approximate)  I have reviewed the triage vital signs and the nursing notes.   HISTORY  Chief Complaint Post-op Problem    HPI Shawn Khan is a 80 y.o. male with past medical history of hypertension, hyperlipidemia, stroke, and GERD who presents to the ED complaining of postop problem.  Patient was discharged from the hospital yesterday following admission for cholecystitis and associated abscess.  He underwent IR drainage of abscess along with placement of cholecystostomy tube 4 days ago, was discharged home on Augmentin as well as oxycodone for pain.  Wife is concerned that overnight last night the drain from the abscess began collecting dark green fluid, when it was originally draining cloudy fluid mixed with blood.  She reports about 40 cc of drainage from the time of discharge.  Patient reports ongoing pain in his abdomen which is worse whenever he takes a deep breath.  He has not had any fevers, nausea, vomiting, chest pain, or shortness of breath.  He last took his oxycodone at 130 this morning, states this had been controlling his pain fairly well and he would like another dose.        Past Medical History:  Diagnosis Date   Arthritis    Chronic hoarseness    Complication of anesthesia    h/o nasal intubation x 1 hoarseness x 1 week   Contusion of chest    Cough    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   Gastritis    GERD (gastroesophageal reflux disease)    Hemorrhagic stroke ([place])    more than 5 years ago   History of chicken pox    Hyperlipidemia    did not like crestor    Hypertension    Hypoglycemia    Hypogonadism in male    Insomnia    Low testosterone in male    Osteoporosis    Osteoporosis    Prediabetes    Prediabetes    A1C 5.9 06/25/18    Rotator cuff disorder     Sleep apnea    cpap   Stroke ([place])    Vocal cord dysfunction    in 2018 damage per pt    Vocal cord nodule    Zenker diverticulum    1.7 cm    Zenker diverticulum     Patient Active Problem List   Diagnosis Date Noted   Acute cholecystitis 06/12/2021   Aortic atherosclerosis ([place]) 03/02/2019   Bradycardia 12/18/2018   Leg cramps 12/18/2018   Prediabetes 10/03/2018   Barrett's esophagus 10/03/2018   Penile ulcer 09/16/2018   Zenker's diverticulum 09/16/2018   Degenerative joint disease involving multiple joints on both sides of body 04/23/2018   OSA (obstructive sleep apnea) 04/23/2018   Urinary urgency 04/23/2018   Seborrheic keratosis 04/23/2018   Microscopic hematuria 11/20/2017   Acquired phimosis 11/20/2017   Benign essential hypertension 11/20/2017   Benign prostatic hyperplasia with urinary obstruction 11/20/2017   Chronic hoarseness 11/20/2017   Contusion of chest 11/20/2017   Disorder of bone and articular cartilage 11/20/2017   Disorder of rotator cuff 11/20/2017   Dysphagia 11/20/2017   Gastroesophageal reflux disease 11/20/2017   Generalized osteoarthritis 11/20/2017   History of stroke without residual deficits 11/20/2017   Hypoxemia 11/20/2017   Impaired fasting glucose 11/20/2017   Increased frequency of urination 11/20/2017   Insomnia 11/20/2017  Backache 11/20/2017   Mixed hyperlipidemia 11/20/2017   Obstructive sleep apnea syndrome 11/20/2017   Osteoporosis 11/20/2017   Urinary urgency 11/20/2017   Shoulder joint painful on movement 11/20/2017   Seborrheic keratosis 11/20/2017   Rib pain 11/20/2017   Pharyngitis 11/20/2017   Male hypogonadism 11/20/2017    Past Surgical History:  Procedure Laterality Date   APPENDECTOMY     1955   EYE SURGERY     b/l cataract   FEMUR FRACTURE SURGERY     FRACTURE SURGERY     IR GUIDED DRAIN W CATHETER PLACEMENT  06/13/2021   IR PERC CHOLECYSTOSTOMY  06/13/2021   LARYNGOSCOPY  09/17/2017   Procedure:  LARYNGOSCOPY;  Surgeon: Beverly Gust, MD;  Location: [facility] ORS;  Service: ENT;;   PENILE BIOPSY     03/980 ulcer with lichenoid inflammation    rcr Bilateral    SHOULDER SURGERY     x3 right and left    THROAT SURGERY     vocal cord nodule   TONSILLECTOMY     TONSILLECTOMY     1948    Prior to Admission medications   Medication Sig Start Date End Date Taking? Authorizing Provider  alendronate (FOSAMAX) 70 MG tablet Take 70 mg by mouth once a week. Take with a full glass of water on an empty stomach.    [provider]  amoxicillin-clavulanate (AUGMENTIN) 875-125 MG tablet Take 1 tablet by mouth 2 (two) times daily for 7 days. 06/15/21 06/22/21  Tylene Fantasia, PA-C  ascorbic acid (VITAMIN C) 500 MG tablet Take 500 mg by mouth daily.    [provider]  cholecalciferol (VITAMIN D3) 25 MCG (1000 UNIT) tablet Take 1,000 Units by mouth daily.    [provider]  clotrimazole-betamethasone (LOTRISONE) cream Apply 1 application topically 2 (two) times daily. (Apply to the genital area as directed)    [provider]  oxyCODONE (OXY IR/ROXICODONE) 5 MG immediate release tablet Take 1 tablet (5 mg total) by mouth every 6 (six) hours as needed for severe pain or breakthrough pain. 06/15/21   Tylene Fantasia, PA-C  tamsulosin (FLOMAX) 0.4 MG CAPS capsule Take 1 capsule (0.4 mg total) by mouth 2 (two) times daily. appt further refills 04/01/20   McLean-Scocuzza, Nino Glow, MD  venlafaxine XR (EFFEXOR-XR) 37.5 MG 24 hr capsule Take 37.5 mg by mouth daily.    [provider]  vitamin B-12 (CYANOCOBALAMIN) 1000 MCG tablet Take 1,000 mcg by mouth daily.    [provider]  zinc sulfate 220 (50 Zn) MG capsule Take 220 mg by mouth daily.    [provider]    Allergies Patient has no known allergies.  Family History  Problem Relation Age of Onset   Hearing loss Father    Other Father        brain tumor   Parkinson's disease Father     Arthritis Father    Cancer Mother        pancreatic    Diabetes Sister    Arthritis Son    Stroke Maternal Grandfather    Stroke Paternal Grandmother    Stroke Paternal Grandfather    Diabetes Sister    Hyperlipidemia Sister    Hypertension Sister    Asthma Daughter    Hypertension Daughter    Asthma Son    Prostate cancer Neg Hx    Bladder Cancer Neg Hx    Kidney cancer Neg Hx     Social History Social History  Tobacco Use   Smoking status: Never   Smokeless tobacco: Never  Vaping Use   Vaping Use: Never used  Substance Use Topics   Alcohol use: No   Drug use: No    Review of Systems  Constitutional: No fever/chills Eyes: No visual changes. ENT: No sore throat. Cardiovascular: Denies chest pain. Respiratory: Denies shortness of breath. Gastrointestinal: Positive for abdominal pain.  No nausea, no vomiting.  No diarrhea.  No constipation. Genitourinary: Negative for dysuria. Musculoskeletal: Negative for back pain. Skin: Negative for rash. Neurological: Negative for headaches, focal weakness or numbness.  ____________________________________________   PHYSICAL EXAM:  VITAL SIGNS: ED Triage Vitals  Enc Vitals Group     BP 06/17/21 0139 115/80     Pulse Rate 06/17/21 0139 84     Resp 06/17/21 0139 18     Temp 06/17/21 0139 99.6 F (37.6 C)     Temp Source 06/17/21 0139 Oral     SpO2 06/17/21 0139 92 %     Weight 06/17/21 0137 181 lb (82.1 kg)     Height 06/17/21 0137 5\' 8"  (1.727 m)     Head Circumference --      Peak Flow --      Pain Score 06/17/21 0137 3     Pain Loc --      Pain Edu? --      Excl. in [place]? --     Constitutional: Alert and oriented. Eyes: Conjunctivae are normal. Head: Atraumatic. Nose: No congestion/rhinnorhea. Mouth/Throat: Mucous membranes are moist. Neck: Normal ROM Cardiovascular: Normal rate, regular rhythm. Grossly normal heart sounds. Respiratory: Normal respiratory effort.  No retractions. Lungs  CTAB. Gastrointestinal: Soft and diffusely tender to palpation with no rebound or guarding. No distention.  Pigtail drain to right upper quadrant with small amount of bile in receptacle.  Cholecystostomy drain to right upper quadrant draining biliary fluid.  Drains and sutures are intact with no surrounding erythema or warmth. Genitourinary: deferred Musculoskeletal: No lower extremity tenderness nor edema. Neurologic:  Normal speech and language. No gross focal neurologic deficits are appreciated. Skin:  Skin is warm, dry and intact. No rash noted. Psychiatric: Mood and affect are normal. Speech and behavior are normal.  ____________________________________________   LABS (all labs ordered are listed, but only abnormal results are displayed)  Labs Reviewed  CBC WITH DIFFERENTIAL/PLATELET - Abnormal; Notable for the following components:      Result Value   WBC 13.6 (*)    Neutro Abs 10.4 (*)    Monocytes Absolute 1.5 (*)    Abs Immature Granulocytes 0.08 (*)    All other components within normal limits  BASIC METABOLIC PANEL - Abnormal; Notable for the following components:   Sodium 131 (*)    Chloride 97 (*)    Glucose, Bld 169 (*)    Calcium 8.6 (*)    All other components within normal limits  HEPATIC FUNCTION PANEL - Abnormal; Notable for the following components:   Albumin 3.0 (*)    All other components within normal limits  LIPASE, BLOOD  TROPONIN I (HIGH SENSITIVITY)     PROCEDURES  Procedure(s) performed (including Critical Care):  Procedures   ____________________________________________   INITIAL IMPRESSION / ASSESSMENT AND PLAN / ED COURSE      80 year old male with past medical history of hypertension, hyperlipidemia, stroke, and GERD who presents to the ED complaining of change in color of fluid collecting into his RUQ drain placed 4 days ago for suspected biliary abscess.  He complains  of ongoing pain, although this seems to have been well managed with  oxycodone.  Vital signs are reassuring, labs are unremarkable, LFTs and lipase within normal limits.  We will give patient's usual dose of oxycodone to control his pain.  Abscess drain does appear to be collecting bile, which is not necessarily unexpected because infected biloma was on the initial differential.  Speaking with Dr. Celine Ahr of general surgery, patient initially dealt with ruptured gallbladder back in February and there is likely ongoing leak.  She states that there is no acute intervention needed at this time given bile is appropriately draining.  Patient may continue antibiotics along with oxycodone for pain, has close follow-up scheduled with general surgery and his PCP for later this coming week.  Patient and wife were counseled to continue taking Augmentin as well as oxycodone as needed for pain.  They were counseled to return to the ED for any fevers, worsening pain, inability to tolerate medications, or any other worsening symptoms.  Patient and family agree with plan.      ____________________________________________   FINAL CLINICAL IMPRESSION(S) / ED DIAGNOSES  Final diagnoses:  Encounter for biliary drainage tube placement  Biloma     ED Discharge Orders     None        Note:  This document was prepared using Dragon voice recognition software and may include unintentional dictation errors.    Blake Divine, MD 06/17/21 [phone]

## 2021-06-17 NOTE — ED Notes (Signed)
Pt provided pillow as requested 

## 2021-06-18 LAB — AEROBIC/ANAEROBIC CULTURE W GRAM STAIN (SURGICAL/DEEP WOUND)

## 2021-06-19 LAB — CULTURE, BLOOD (SINGLE)
Culture: NO GROWTH
Special Requests: ADEQUATE

## 2021-06-21 ENCOUNTER — Other Ambulatory Visit: Payer: Self-pay | Admitting: Physician Assistant

## 2021-06-21 ENCOUNTER — Ambulatory Visit
Admission: RE | Admit: 2021-06-21 | Discharge: 2021-06-21 | Disposition: A | Discharge status: Home / Self Care | Payer: Medicare Other | Source: Ambulatory Visit | Attending: Physician Assistant | Admitting: Physician Assistant

## 2021-06-21 ENCOUNTER — Other Ambulatory Visit: Payer: Self-pay

## 2021-06-21 DIAGNOSIS — K651 Peritoneal abscess: Secondary | ICD-10-CM | POA: Insufficient documentation

## 2021-06-21 DIAGNOSIS — K810 Acute cholecystitis: Secondary | ICD-10-CM

## 2021-06-21 MED ORDER — IOHEXOL 350 MG/ML SOLN
80.0000 mL | Freq: Once | INTRAVENOUS | Status: AC | PRN
  Administered 2021-06-21: 80 mL via INTRAVENOUS

## 2021-06-22 ENCOUNTER — Encounter: Payer: Self-pay | Admitting: Physician Assistant

## 2021-06-22 ENCOUNTER — Ambulatory Visit (INDEPENDENT_AMBULATORY_CARE_PROVIDER_SITE_OTHER): Payer: Medicare Other | Admitting: Physician Assistant

## 2021-06-22 VITALS — BP 132/76 | HR 67 | Temp 98.4°F | Ht 69.0 in | Wt 181.0 lb

## 2021-06-22 DIAGNOSIS — K810 Acute cholecystitis: Secondary | ICD-10-CM

## 2021-06-22 NOTE — Progress Notes (Signed)
[facility] NOTE  06/22/2021  History of Present Illness: Shawn Khan is a 80 y.o. male known to our service following recent admission for recurrent cholecystitis with abscess. In brief, in February of 2022, which was managed at [facility] secondary to concerns for possible ascending cholangitis. Ultimately, this was managed with percutaneous cholecystostomy tube placed at 02/21. Cholecystostomy tube was ultimately removed on 03/28 after drain injection showed patent duct. Due to significant medical comorbid conditions, [facility] surgery elected to not perform cholecystectomy. He presented to [facility] on 06/27 for abdominal pain and was again found to have recurrent cholecystitis with concomitant abscess. He underwent cholecystostomy and abscess drain placement on 06/28. He did well and was ultimately discharged on 06/30. He presents today for follow up.   Unfortunately, he reports that he has been having significant issues with abdominal pain over the last few days. This remains worse in the RUQ and with deep inspiration and movement. His T-max at home has been 56F yesterday. He did have some nausea. No fever, chills, CP, SOB, or bowel changes. He did ultimately go to the ED on 07/02 but was ultimately discharged home. He followed up with his PCP yesterday (07/06) and he had CBC and BMP drawn which showed mild leukocytosis to 13.2K but was otherwise reassuring. He also underwent CT Abdomen/Pelvis which I have reviewed and showed drains in stable position with improvement in previously seen fluid collection adjacent to GB. His pain medications were refilled as well. Additionally,. His family reports that he has significant bile staining on his dressing for multiple days prior to changing this. The skin underneath had become exceptionally raw and erythematous. His drainage has changed to bile and the majority is actually draining from his abscess drain. I suspect this is secondary to  known perforation of his gallbladder.    Past Medical History: Past Medical History:  Diagnosis Date   Arthritis    Chronic hoarseness    Complication of anesthesia    h/o nasal intubation x 1 hoarseness x 1 week   Contusion of chest    Cough    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   Gastritis    GERD (gastroesophageal reflux disease)    Hemorrhagic stroke ([place])    more than 5 years ago   History of chicken pox    Hyperlipidemia    did not like crestor    Hypertension    Hypoglycemia    Hypogonadism in male    Insomnia    Low testosterone in male    Osteoporosis    Osteoporosis    Prediabetes    Prediabetes    A1C 5.9 06/25/18    Rotator cuff disorder    Sleep apnea    cpap   Stroke [facility])    Vocal cord dysfunction    in 2018 damage per pt    Vocal cord nodule    Zenker diverticulum    1.7 cm    Zenker diverticulum      Past Surgical History: Past Surgical History:  Procedure Laterality Date   APPENDECTOMY     1955   EYE SURGERY     b/l cataract   FEMUR FRACTURE SURGERY     FRACTURE SURGERY     IR GUIDED DRAIN W CATHETER PLACEMENT  06/13/2021   IR PERC CHOLECYSTOSTOMY  06/13/2021   LARYNGOSCOPY  09/17/2017   Procedure: LARYNGOSCOPY;  Surgeon: Beverly Gust, MD;  Location: [facility] ORS;  Service: ENT;;   PENILE BIOPSY  02/3353 ulcer with lichenoid inflammation    rcr Bilateral    SHOULDER SURGERY     x3 right and left    THROAT SURGERY     vocal cord nodule   TONSILLECTOMY     TONSILLECTOMY     1948    Home Medications: Prior to Admission medications   Medication Sig Start Date End Date Taking? Authorizing Provider  alendronate (FOSAMAX) 70 MG tablet Take 70 mg by mouth once a week. Take with a full glass of water on an empty stomach.    [provider]  amoxicillin-clavulanate (AUGMENTIN) 875-125 MG tablet Take 1 tablet by mouth 2 (two) times daily for 7 days. 06/15/21 06/22/21  Tylene Fantasia, PA-C  ascorbic acid (VITAMIN C) 500 MG tablet Take 500 mg by  mouth daily.    [provider]  cholecalciferol (VITAMIN D3) 25 MCG (1000 UNIT) tablet Take 1,000 Units by mouth daily.    [provider]  clotrimazole-betamethasone (LOTRISONE) cream Apply 1 application topically 2 (two) times daily. (Apply to the genital area as directed)    [provider]  oxyCODONE (OXY IR/ROXICODONE) 5 MG immediate release tablet Take 1 tablet (5 mg total) by mouth every 6 (six) hours as needed for severe pain or breakthrough pain. 06/15/21   Tylene Fantasia, PA-C  tamsulosin (FLOMAX) 0.4 MG CAPS capsule Take 1 capsule (0.4 mg total) by mouth 2 (two) times daily. appt further refills 04/01/20   McLean-Scocuzza, Nino Glow, MD  venlafaxine XR (EFFEXOR-XR) 37.5 MG 24 hr capsule Take 37.5 mg by mouth daily.    [provider]  vitamin B-12 (CYANOCOBALAMIN) 1000 MCG tablet Take 1,000 mcg by mouth daily.    [provider]  zinc sulfate 220 (50 Zn) MG capsule Take 220 mg by mouth daily.    [provider]    Allergies: No Known Allergies  Review of Systems: Review of Systems  Constitutional:  Negative for chills and fever.  HENT:  Negative for congestion and sore throat.   Respiratory:  Negative for cough and shortness of breath.   Cardiovascular:  Negative for chest pain and palpitations.  Gastrointestinal:  Positive for abdominal pain and nausea. Negative for constipation, diarrhea and vomiting.  Genitourinary:  Negative for dysuria and urgency.  All other systems reviewed and are negative.  Physical Exam Wt 181 lb (82.1 kg)   BMI 27.52 kg/m   Physical Exam Vitals and nursing note reviewed. Exam conducted with a chaperone present.  Constitutional:      Appearance: Normal appearance.     Comments: Appear uncomfortable. Wife and daughter at bedside  HENT:     Head: Normocephalic and atraumatic.  Eyes:     Extraocular Movements: Extraocular movements intact.     Conjunctiva/sclera: Conjunctivae normal.      Pupils: Pupils are equal, round, and reactive to light.  Cardiovascular:     Rate and Rhythm: Normal rate.     Pulses: Normal pulses.     Heart sounds: No murmur heard. Pulmonary:     Effort: Pulmonary effort is normal. No respiratory distress.  Abdominal:     General: Abdomen is flat. There is no distension.     Palpations: Abdomen is soft.     Tenderness: There is abdominal tenderness in the right upper quadrant.     Comments: Abdomen is soft, tenderness in the RUQ, non-distended. Cholecystostomy and percutaneous drains in the RUQ, bilious output primarily in JP bulb, bile staining in both tubing and cholecystostomy  bag. Again, the skin surrounding the drains is erythematous and macerated.   Genitourinary:    Comments: Deferred Skin:    Findings: Erythema present.       Neurological:     General: No focal deficit present.     Mental Status: He is alert and oriented to person, place, and time.  Psychiatric:        Mood and Affect: Mood normal.        Behavior: Behavior normal.    RUQ (06/22/2021):     Labs/Imaging:  CT Abdomen/Pelvis (06/21/2021) personally reviewed which shows stable position of cholecystostomy tube in gallbladder fossa which is decompressed with known cholelithiasis, stable position of percutaneous drain with resolution of previous fluid collection, no other intra-abdominal processes identified, and radiologist report review: IMPRESSION: 1. Interval development of a possibly loculated right base trace to small volume pleural effusion. No definite findings of empyema. 2. Interval placement of a gallbladder fossa drain with almost complete resolution of previously identified gallbladder fossa abscess. Residual 0.9 cm pericholecystic hypodensity along the drain that may represent represent trace persistent fluid. 3. Interval placement of a cholecystostomy tube with tip terminating within the gallbladder lumen along its neck. Cholelithiasis with  a decompressed gallbladder. Suggestion of associated gallbladder wall thickening and possible trace pericholecystic fluid.   Assessment and Plan: This is a 80 y.o. male with recurrent perforated cholecystitis with abscess s/p percutaneous drainage and cholecystostomy on 06/28   - I do believe that a significant portion of his discomfort is attributed to the maceration of his skin likely secondary to leaving bile soaked dressings in place for multiple days. I completely removed his dressing and cleaned the area. I was able to apply lidocaine cream to this which significantly helped. I encouraged his family to keep this area clean and dry and use light superficial dressings as needed.   - I reviewed his CT scan  at bedside with the patient and his family. His drainers are in excellent position and the remainder of his imaging was reassuring. They understand that these need to stay in place for a minimum of 6-8 week; however, given known perforation I anticipate these will likely need to stay in place until any surgical intervention is pursued.   - His pain medication was refilled by PCP yesterday  - No need for any further antibiotics at this time  - Reviewed signs and symptoms of worsening  - I will have him follow up in 2 weeks with Dr Celine Ahr, or sooner, for reassessment  Face-to-face time spent with the patient and care providers was 30 minutes, with more than 50% of the time spent counseling, educating, and coordinating care of the patient.     Shawn Simon, PA-C [facility] 06/22/2021, 11:17 AM [phone] M-F: 7am - 4pm

## 2021-06-22 NOTE — Patient Instructions (Addendum)
You may use Desitin to the skin after you clean the area well. You may also try topical lidocaine cream. Leave open to are as much as possible. You may place gauze over it loosely.   Call us if you have a fever greater than 101, nausea/vomiting, pain that is not controlled. You may need to go to the ER.  Continue to flush the drain as able.   Continue to keep a record of the drainage, color and amount.  We will have you follow up in 2 weeks to see Dr Celine Ahr.

## 2021-06-23 ENCOUNTER — Telehealth: Payer: Self-pay | Admitting: Physician Assistant

## 2021-06-23 DIAGNOSIS — R7881 Bacteremia: Secondary | ICD-10-CM | POA: Insufficient documentation

## 2021-06-23 DIAGNOSIS — K810 Acute cholecystitis: Secondary | ICD-10-CM | POA: Insufficient documentation

## 2021-06-23 NOTE — Telephone Encounter (Signed)
Pt's spouse,. Santiago Glad, called advising the pt was transported by EMS to [facility] ED for evaluation & possible abscess in the lower lung, after presenting w/fever, shaking/shivering & increased pain yesterday evening. Santiago Glad wanted to make sure Thedore Mins, [place] was aware.  Thank you

## 2021-07-06 ENCOUNTER — Ambulatory Visit: Payer: Medicare Other | Admitting: General Surgery

## 2021-09-18 IMAGING — CR DG CHEST 2V
2 series · 3 of 3 positions shown · non-contrast
Comparison: Chest radiographs 09/22/2013. CT Abdomen and Pelvis
06/12/2021.

CLINICAL DATA: 79-year-old male with cough. Recently admitted with
percutaneous cholecystostomy, imaging guided drainage of hepatic
abscess.

EXAM:
CHEST - 2 VIEW

[chest lat]
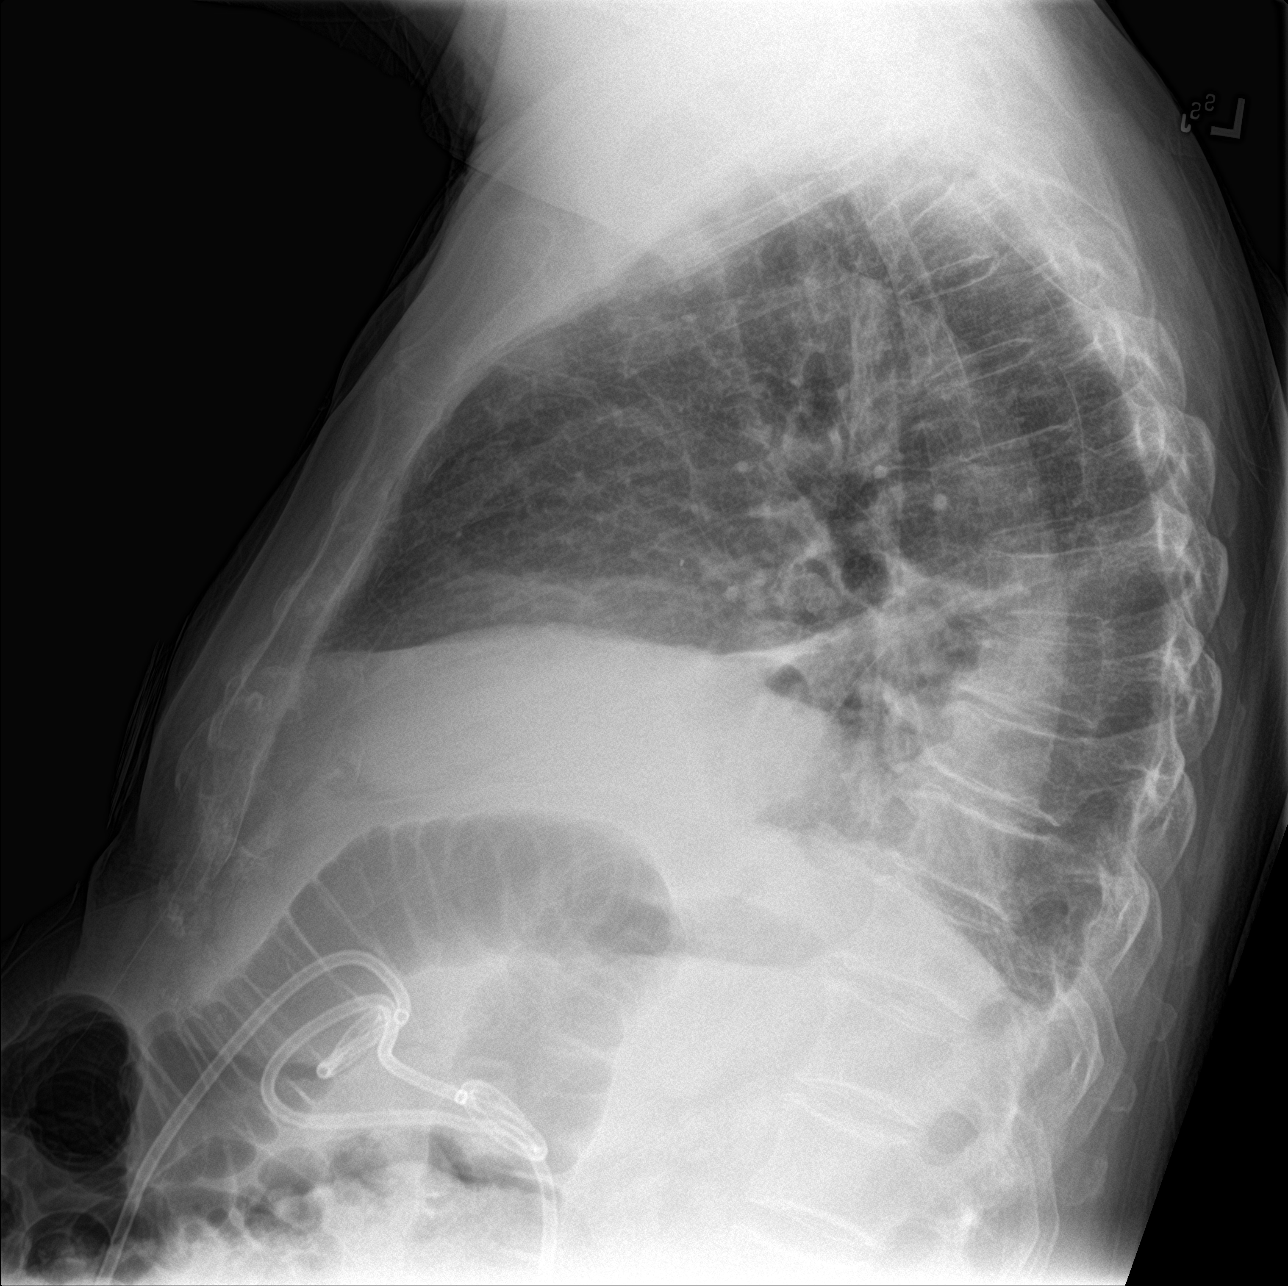

[Series 3: chest ap · 0.14mm/px · 2 of 2 slices shown]
[im 1/2]
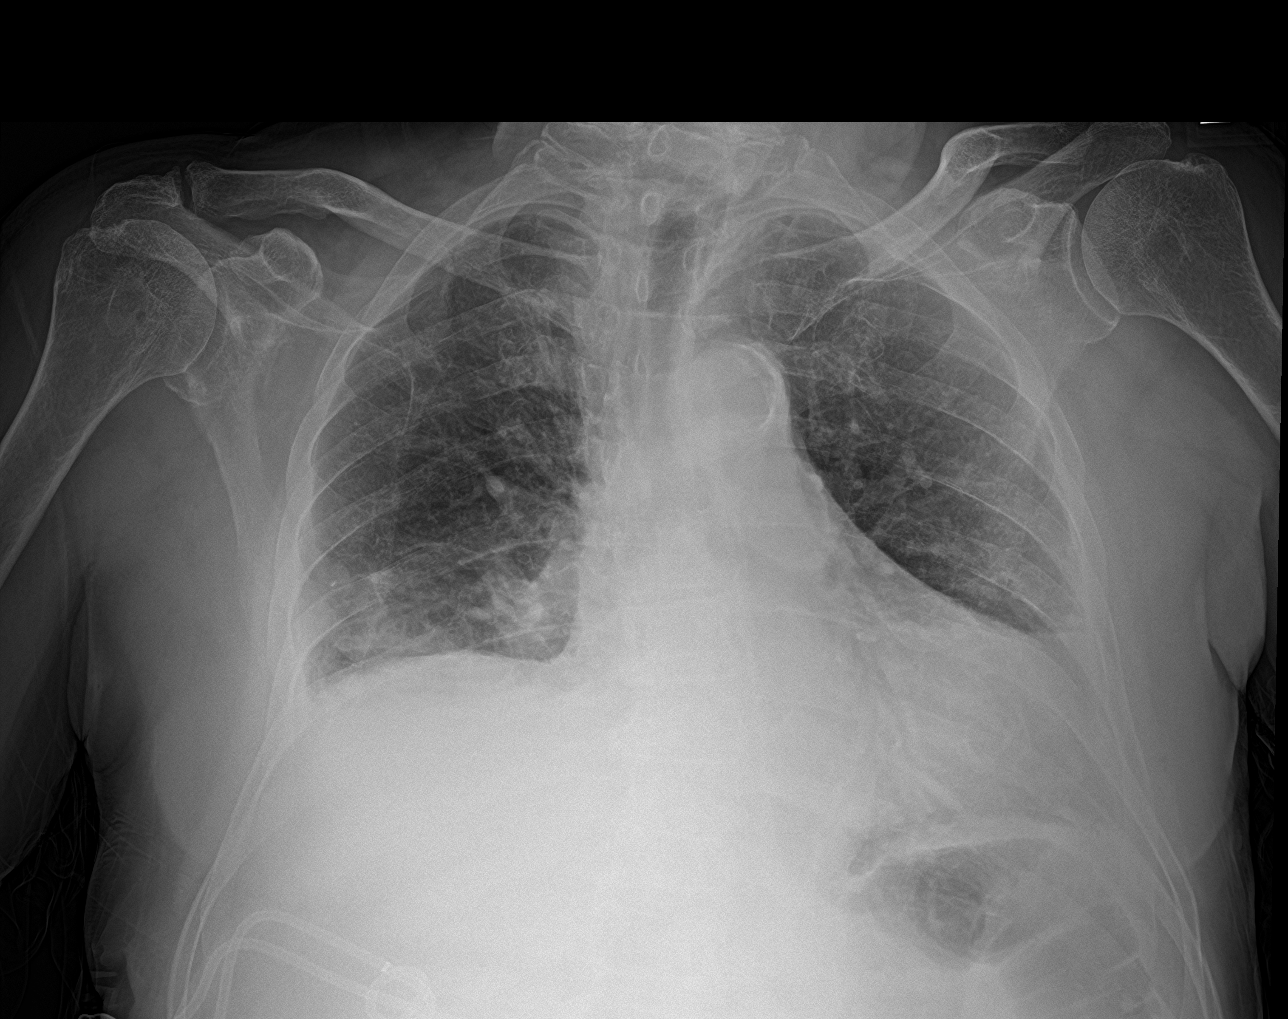
[im 2/2]
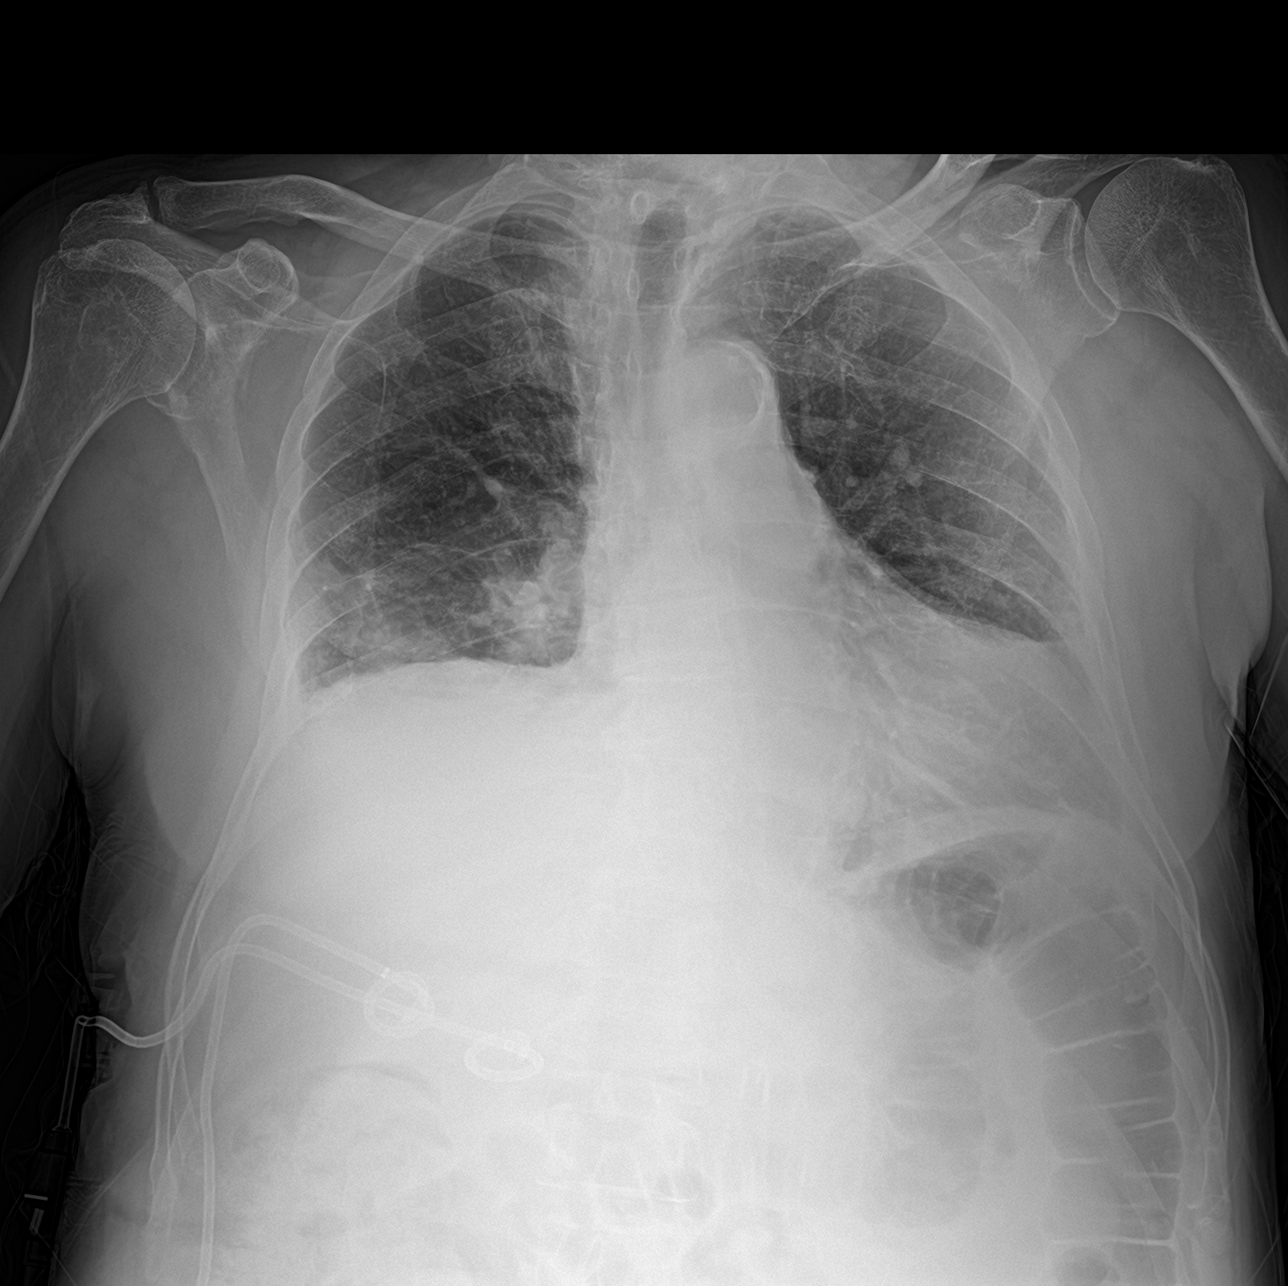

[3 of 3 positions shown; findings below may reference images not displayed]

FINDINGS: Seated AP and lateral views of the chest. Lower lung volumes.
Patchy, indistinct right lung base opacity is new from the recent
CT. And on the lateral view small pleural effusions are suggested.
Stable cardiac size and mediastinal contours. Calcified aortic
atherosclerosis. No pneumothorax or pulmonary edema. Two right upper
quadrant abdominal drains are in place. Gas-filled transverse colon.
No dilated small bowel is evident. No acute osseous abnormality
identified.
IMPRESSION: 1. Lower lung volumes with patchy and indistinct new right lower
lung opacity, suspicious for trans-diaphragmatic spread of the
hepatic infection since 06/12/2021.
[DATE]. Small pleural effusions suspected.
3. Two right upper quadrant drains in place.

## 2021-10-10 ENCOUNTER — Encounter: Payer: Self-pay | Admitting: General Surgery

## 2022-03-15 ENCOUNTER — Ambulatory Visit
Admission: EM | Admit: 2022-03-15 | Discharge: 2022-03-15 | Disposition: A | Discharge status: Home / Self Care | Payer: Medicare Other | Attending: Emergency Medicine | Admitting: Emergency Medicine

## 2022-03-15 ENCOUNTER — Ambulatory Visit (INDEPENDENT_AMBULATORY_CARE_PROVIDER_SITE_OTHER): Payer: Medicare Other

## 2022-03-15 DIAGNOSIS — J069 Acute upper respiratory infection, unspecified: Secondary | ICD-10-CM

## 2022-03-15 DIAGNOSIS — R0602 Shortness of breath: Secondary | ICD-10-CM | POA: Diagnosis not present

## 2022-03-15 DIAGNOSIS — R059 Cough, unspecified: Secondary | ICD-10-CM | POA: Diagnosis not present

## 2022-03-15 MED ORDER — AZITHROMYCIN 250 MG PO TABS: 250.0000 mg | ORAL_TABLET | Freq: Every day | ORAL | 0 refills | Status: DC

## 2022-03-15 MED ORDER — PREDNISONE 10 MG (21) PO TBPK: ORAL_TABLET | Freq: Every day | ORAL | 0 refills | Status: DC

## 2022-03-15 MED ORDER — ALBUTEROL SULFATE HFA 108 (90 BASE) MCG/ACT IN AERS: 1.0000 | INHALATION_SPRAY | Freq: Four times a day (QID) | RESPIRATORY_TRACT | 0 refills | Status: AC | PRN

## 2022-03-15 MED ORDER — LEVOCETIRIZINE DIHYDROCHLORIDE 5 MG PO TABS: 5.0000 mg | ORAL_TABLET | Freq: Every evening | ORAL | 0 refills | Status: AC

## 2022-03-15 NOTE — Discharge Instructions (Addendum)
Your chest x-ray is normal no pneumonia seen ?If symptoms become worse you will need to be reseen in 1 week to be seen in the emergency room for shortness of breath or any chest pain ?I want you to take allergy medicine every night for the next 2 weeks ?The albuterol inhaler you will just need to get filled if you feel you need it in the next 24 hours ? ?

## 2022-03-15 NOTE — ED Provider Notes (Signed)
?[place] ? ? ? ?CSN: [id number] ?Arrival date & time: 03/15/22  0919 ? ? ?  ? ?History   ?Chief Complaint ?Chief Complaint  ?Patient presents with  ? Cough  ?  Chest congestion   ? ? ?HPI ?Shawn Khan is a 81 y.o. male.  ? ?Patient presents today with cough slight shortness of breath x2 weeks.  Patient states that he has been coughing up thick phlegm and seems to be getting worse.  Patient has been taken over-the-counter cough and cold allergy medicine with no relief.  Patient is concerned for pneumonia.  Denies any known fevers eating and drinking well. ? ? ?Past Medical History:  ?Diagnosis Date  ? Arthritis   ? Chronic hoarseness   ? Complication of anesthesia   ? h/o nasal intubation x 1 hoarseness x 1 week  ? Contusion of chest   ? Cough   ? 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  ? Gastritis   ? GERD (gastroesophageal reflux disease)   ? Hemorrhagic stroke ([place])   ? more than 5 years ago  ? History of chicken pox   ? Hyperlipidemia   ? did not like crestor   ? Hypertension   ? Hypoglycemia   ? Hypogonadism in male   ? Insomnia   ? Low testosterone in male   ? Osteoporosis   ? Osteoporosis   ? Prediabetes   ? Prediabetes   ? A1C 5.9 06/25/18   ? Rotator cuff disorder   ? Sleep apnea   ? cpap  ? Stroke [facility])   ? Vocal cord dysfunction   ? in 2018 damage per pt   ? Vocal cord nodule   ? Zenker diverticulum   ? 1.7 cm   ? Zenker diverticulum   ? ? ?Patient Active Problem List  ? Diagnosis Date Noted  ? Acute cholecystitis 06/12/2021  ? Aortic atherosclerosis ([place]) 03/02/2019  ? Bradycardia 12/18/2018  ? Leg cramps 12/18/2018  ? Prediabetes 10/03/2018  ? Barrett's esophagus 10/03/2018  ? Penile ulcer 09/16/2018  ? Zenker's diverticulum 09/16/2018  ? Degenerative joint disease involving multiple joints on both sides of body 04/23/2018  ? OSA (obstructive sleep apnea) 04/23/2018  ? Urinary urgency 04/23/2018  ? Seborrheic keratosis 04/23/2018  ? Microscopic hematuria 11/20/2017  ? Acquired phimosis 11/20/2017  ?  Benign essential hypertension 11/20/2017  ? Benign prostatic hyperplasia with urinary obstruction 11/20/2017  ? Chronic hoarseness 11/20/2017  ? Contusion of chest 11/20/2017  ? Disorder of bone and articular cartilage 11/20/2017  ? Disorder of rotator cuff 11/20/2017  ? Dysphagia 11/20/2017  ? Gastroesophageal reflux disease 11/20/2017  ? Generalized osteoarthritis 11/20/2017  ? History of stroke without residual deficits 11/20/2017  ? Hypoxemia 11/20/2017  ? Impaired fasting glucose 11/20/2017  ? Increased frequency of urination 11/20/2017  ? Insomnia 11/20/2017  ? Backache 11/20/2017  ? Mixed hyperlipidemia 11/20/2017  ? Obstructive sleep apnea syndrome 11/20/2017  ? Osteoporosis 11/20/2017  ? Urinary urgency 11/20/2017  ? Shoulder joint painful on movement 11/20/2017  ? Seborrheic keratosis 11/20/2017  ? Rib pain 11/20/2017  ? Pharyngitis 11/20/2017  ? Male hypogonadism 11/20/2017  ? ? ?Past Surgical History:  ?Procedure Laterality Date  ? APPENDECTOMY    ? 1955  ? EYE SURGERY    ? b/l cataract  ? FEMUR FRACTURE SURGERY    ? FRACTURE SURGERY    ? IR GUIDED DRAIN W CATHETER PLACEMENT  06/13/2021  ? IR PERC CHOLECYSTOSTOMY  06/13/2021  ? LARYNGOSCOPY  09/17/2017  ?  Procedure: LARYNGOSCOPY;  Surgeon: Beverly Gust, MD;  Location: [facility] ORS;  Service: ENT;;  ? PENILE BIOPSY    ? 12/6382 ulcer with lichenoid inflammation   ? rcr Bilateral   ? SHOULDER SURGERY    ? x3 right and left   ? THROAT SURGERY    ? vocal cord nodule  ? TONSILLECTOMY    ? TONSILLECTOMY    ? 1948  ? ? ? ? ? ?Home Medications   ? ?Prior to Admission medications   ?Medication Sig Start Date End Date Taking? Authorizing Provider  ?albuterol (VENTOLIN HFA) 108 (90 Base) MCG/ACT inhaler Inhale 1-2 puffs into the lungs every 6 (six) hours as needed for wheezing or shortness of breath. 03/15/22  Yes Marney Setting, NP  ?azithromycin (ZITHROMAX) 250 MG tablet Take 1 tablet (250 mg total) by mouth daily. Take first 2 tablets together, then 1 every day  until finished. 03/15/22  Yes Marney Setting, NP  ?levocetirizine (XYZAL) 5 MG tablet Take 1 tablet (5 mg total) by mouth every evening. 03/15/22  Yes Marney Setting, NP  ?predniSONE (STERAPRED UNI-PAK 21 TAB) 10 MG (21) TBPK tablet Take by mouth daily. Take 6 tabs by mouth daily  for 2 days, then 5 tabs for 2 days, then 4 tabs for 2 days, then 3 tabs for 2 days, 2 tabs for 2 days, then 1 tab by mouth daily for 2 days 03/15/22  Yes Marney Setting, NP  ?alendronate (FOSAMAX) 70 MG tablet Take 70 mg by mouth once a week. Take with a full glass of water on an empty stomach.    [provider]  ?ascorbic acid (VITAMIN C) 500 MG tablet Take 500 mg by mouth daily.    [provider]  ?cholecalciferol (VITAMIN D3) 25 MCG (1000 UNIT) tablet Take 1,000 Units by mouth daily.    [provider]  ?clotrimazole-betamethasone (LOTRISONE) cream Apply 1 application topically 2 (two) times daily. (Apply to the genital area as directed)    [provider]  ?oxyCODONE (OXY IR/ROXICODONE) 5 MG immediate release tablet Take 1 tablet (5 mg total) by mouth every 6 (six) hours as needed for severe pain or breakthrough pain. 06/15/21   Tylene Fantasia, PA-C  ?tamsulosin (FLOMAX) 0.4 MG CAPS capsule Take 1 capsule (0.4 mg total) by mouth 2 (two) times daily. appt further refills 04/01/20   McLean-Scocuzza, Nino Glow, MD  ?venlafaxine XR (EFFEXOR-XR) 37.5 MG 24 hr capsule Take 37.5 mg by mouth daily.    [provider]  ?vitamin B-12 (CYANOCOBALAMIN) 1000 MCG tablet Take 1,000 mcg by mouth daily.    [provider]  ?zinc sulfate 220 (50 Zn) MG capsule Take 220 mg by mouth daily.    [provider]  ? ? ?Family History ?Family History  ?Problem Relation Age of Onset  ? Hearing loss Father   ? Other Father   ?     brain tumor  ? Parkinson's disease Father   ? Arthritis Father   ? Cancer Mother   ?     pancreatic   ? Diabetes Sister   ? Arthritis Son   ? Stroke Maternal  Grandfather   ? Stroke Paternal Grandmother   ? Stroke Paternal Grandfather   ? Diabetes Sister   ? Hyperlipidemia Sister   ? Hypertension Sister   ? Asthma Daughter   ? Hypertension Daughter   ? Asthma Son   ? Prostate cancer Neg Hx   ? Bladder Cancer Neg  Hx   ? Kidney cancer Neg Hx   ? ? ?Social History ?Social History  ? ?Tobacco Use  ? Smoking status: Never  ? Smokeless tobacco: Never  ?Vaping Use  ? Vaping Use: Never used  ?Substance Use Topics  ? Alcohol use: No  ? Drug use: No  ? ? ? ?Allergies   ?Patient has no known allergies. ? ? ?Review of Systems ?Review of Systems  ?Constitutional:  Positive for chills. Negative for fever.  ?HENT:  Positive for postnasal drip and rhinorrhea. Negative for congestion, mouth sores, sinus pressure, sinus pain, sneezing and sore throat.   ?Respiratory:  Positive for cough and shortness of breath. Negative for wheezing.   ?Cardiovascular: Negative.   ?Gastrointestinal: Negative.   ?Genitourinary: Negative.   ?Skin: Negative.   ?Neurological: Negative.   ? ? ?Physical Exam ?Triage Vital Signs ?ED Triage Vitals  ?Enc Vitals Group  ?   BP 03/15/22 0952 (!) 143/75  ?   Pulse Rate 03/15/22 0952 68  ?   Resp 03/15/22 0952 16  ?   Temp 03/15/22 0952 98.7 ?F (37.1 ?C)  ?   Temp Source 03/15/22 0952 Oral  ?   SpO2 03/15/22 0952 94 %  ?   Weight --   ?   Height --   ?   Head Circumference --   ?   Peak Flow --   ?   Pain Score 03/15/22 0950 0  ?   Pain Loc --   ?   Pain Edu? --   ?   Excl. in [place]? --   ? ?No data found. ? ?Updated Vital Signs ?BP (!) 143/75 (BP Location: Right Arm)   Pulse 68   Temp 98.7 ?F (37.1 ?C) (Oral)   Resp 16   SpO2 94%  ? ?Visual Acuity ?Right Eye Distance:   ?Left Eye Distance:   ?Bilateral Distance:   ? ?Right Eye Near:   ?Left Eye Near:    ?Bilateral Near:    ? ?Physical Exam ?Constitutional:   ?   Appearance: Normal appearance.  ?HENT:  ?   Nose: Nose normal.  ?   Mouth/Throat:  ?   Mouth: Mucous membranes are moist.  ?Eyes:  ?   Pupils: Pupils are  equal, round, and reactive to light.  ?Cardiovascular:  ?   Rate and Rhythm: Normal rate.  ?   Pulses: Normal pulses.  ?   Heart sounds: Normal heart sounds.  ?Pulmonary:  ?   Breath sounds: Rhonchi present.

## 2022-03-15 NOTE — ED Triage Notes (Signed)
Patient presents to Urgent Care with complaints of cough and chest congestion x 2 weeks. Treating symptoms with allergy med and mucinex.  ?

## 2022-04-06 ENCOUNTER — Ambulatory Visit
Admission: EM | Admit: 2022-04-06 | Discharge: 2022-04-06 | Disposition: A | Discharge status: Home / Self Care | Payer: Medicare Other

## 2022-04-06 DIAGNOSIS — H1031 Unspecified acute conjunctivitis, right eye: Secondary | ICD-10-CM

## 2022-04-06 MED ORDER — MOXIFLOXACIN HCL 0.5 % OP SOLN: OPHTHALMIC | 0 refills | Status: AC

## 2022-04-06 NOTE — ED Notes (Signed)
Bio Glo (Fluorescein Strip) and Eye drops (provided) to provider. Lurline Idol CMA ?

## 2022-04-06 NOTE — ED Provider Notes (Signed)
? ? ?Provider Note ? ?Patient Contact: 9:45 AM (approximate) ? ? ?History  ? ?Eye Problem ? ? ?HPI ? ?Shawn Khan is a 81 y.o. male presents to the urgent care with right eye conjunctivitis with matting and crusting of the medial and lateral canthi for the past 2 to 3 days.  Patient states he has had some mild swelling of the right upper eyelid with no significant periorbital erythema.  Patient states that he has experienced similar symptoms in the past.  No sick contacts in the family with conjunctivitis.  No associated rhinorrhea, nasal congestion or nonproductive cough.  No left eye involvement.  Patient does not wear contacts.  He denies pain with extraocular eye muscle movement.  No recent swimming.  He states that he does mediate but does wear glasses while weed eating.  Denies foreign body sensation. ? ?  ? ? ?Physical Exam  ? ?Triage Vital Signs: ?ED Triage Vitals  ?Enc Vitals Group  ?   BP 04/06/22 0920 125/71  ?   Pulse Rate 04/06/22 0920 62  ?   Resp 04/06/22 0920 18  ?   Temp 04/06/22 0920 97.8 ?F (36.6 ?C)  ?   Temp Source 04/06/22 0920 Oral  ?   SpO2 04/06/22 0920 97 %  ?   Weight 04/06/22 0917 181 lb (82.1 kg)  ?   Height 04/06/22 0917 '5\' 9"'$  (1.753 m)  ?   Head Circumference --   ?   Peak Flow --   ?   Pain Score 04/06/22 0917 0  ?   Pain Loc --   ?   Pain Edu? --   ?   Excl. in [place]? --   ? ? ?Most recent vital signs: ?Vitals:  ? 04/06/22 0920  ?BP: 125/71  ?Pulse: 62  ?Resp: 18  ?Temp: 97.8 ?F (36.6 ?C)  ?SpO2: 97%  ? ? ? ?General: Alert and in no acute distress. ?Eyes:  PERRL. EOMI. patient has right eye conjunctivitis.  No fluorescein uptake with staining. ?Head: No acute traumatic findings ?ENT: ?     Nose: No congestion/rhinnorhea. ?     Mouth/Throat: Mucous membranes are moist. ?Neck: No stridor. No cervical spine tenderness to palpation. ?Cardiovascular:  Good peripheral perfusion ?Respiratory: Normal respiratory effort without tachypnea or retractions. Lungs CTAB. Good air entry to the  bases with no decreased or absent breath sounds. ?Gastrointestinal: Bowel sounds ?4 quadrants. Soft and nontender to palpation. No guarding or rigidity. No palpable masses. No distention. No CVA tenderness. ?Musculoskeletal: Full range of motion to all extremities.  ?Neurologic:  No gross focal neurologic deficits are appreciated.  ?Skin:   No rash noted ? ? ? ?ED Results / Procedures / Treatments  ? ?Labs ?(all labs ordered are listed, but only abnormal results are displayed) ?Labs Reviewed - No data to display ? ? ? ?PROCEDURES: ? ?Critical Care performed: No ? ?Procedures ? ? ?MEDICATIONS ORDERED IN ED: ?Medications - No data to display ? ? ?IMPRESSION / MDM / ASSESSMENT AND PLAN / ED COURSE  ?I reviewed the triage vital signs and the nursing notes. ?             ?               ? ?Assessment and plan: ?Eye pain:  ?Differential diagnosis includes, but is not limited to, conjunctivitis, scleritis, early periorbital cellulitis ? ?81 year old male presents to the urgent care with 3 days of matting and crusting along the medial and  lateral canthi of the right eye. ? ?Vital signs are reassuring at triage.  On physical exam, patient was alert, active and nontoxic-appearing.  He did have evidence of purulence along the medial and lateral canthi.  He had no perilimbal redness.  There was no fluorescein uptake with staining and patient denied right eye pain or pressure.  Suspect bacterial conjunctivitis at this time.  We will treat with Vigamox, 2 drops every 2 hours for the first 2 days and then 1 drop every 6 hours for the next 5 days.  I did caution patient that should his symptoms worsen, he should seek care at local emergency department for further care and management.  He is an established patient with ophthalmology. ? ?  ? ? ?FINAL CLINICAL IMPRESSION(S) / ED DIAGNOSES  ? ?Final diagnoses:  ?Acute bacterial conjunctivitis of right eye  ? ? ? ?Rx / DC Orders  ? ?ED Discharge Orders   ? ?      Ordered  ?   moxifloxacin (VIGAMOX) 0.5 % ophthalmic solution       ? 04/06/22 0944  ? ?  ?  ? ?  ? ? ? ?Note:  This document was prepared using Dragon voice recognition software and may include unintentional dictation errors. ?  ?Lannie Fields, PA-C ?04/06/22 8875 ? ?

## 2022-04-06 NOTE — Discharge Instructions (Addendum)
Use 2 drops of Vigamox every 2 hours for the first 2 days while awake. ?Then use 2 drops every 6 hours for 5 days. ?

## 2022-04-06 NOTE — ED Triage Notes (Signed)
Patient is here for Right Eye redness, pain, swelling. Started a few days ago. Started OTC drops (Thera Tears)., No improvement. No fever. No injury.  ?

## 2022-06-16 IMAGING — CR DG CHEST 2V
2 series · 2 of 2 positions shown · non-contrast
Comparison: Chest two views 06/17/2021, 09/22/2013

CLINICAL DATA: Cough for 2 weeks.  Shortness of breath.

EXAM:
CHEST - 2 VIEW

[chest pa]
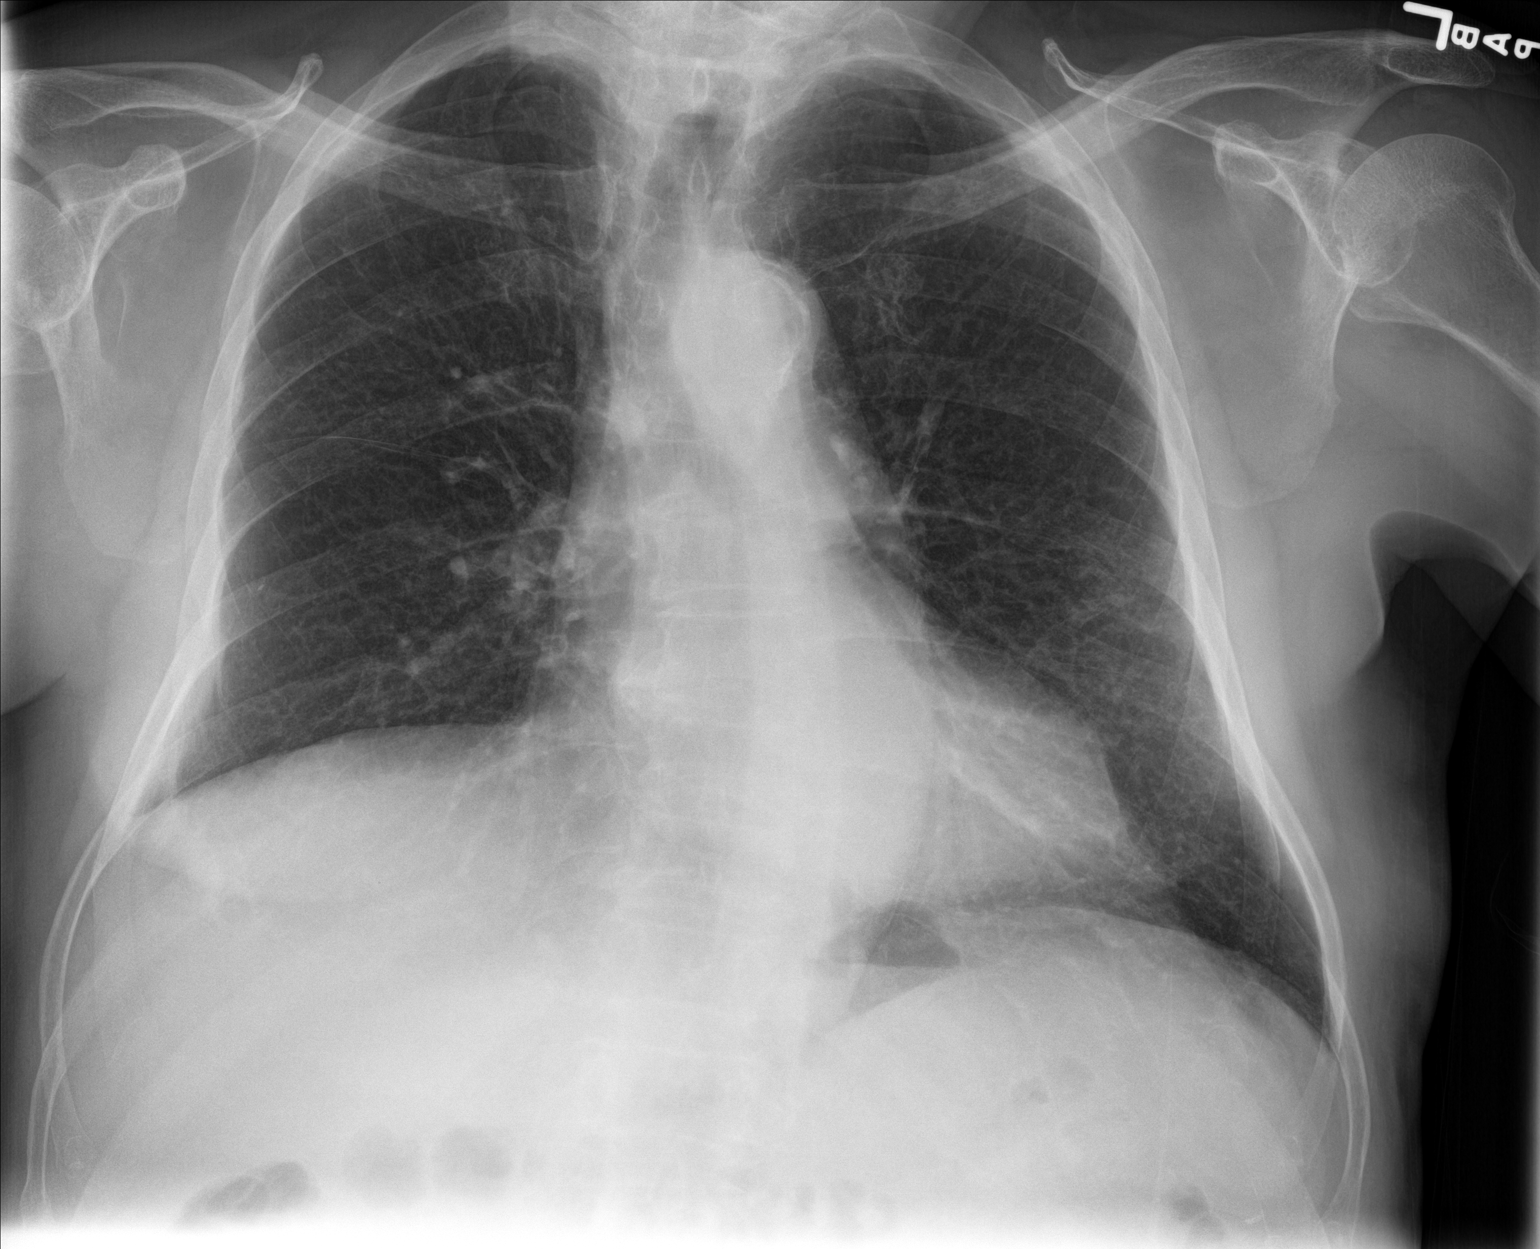

[chest lat]
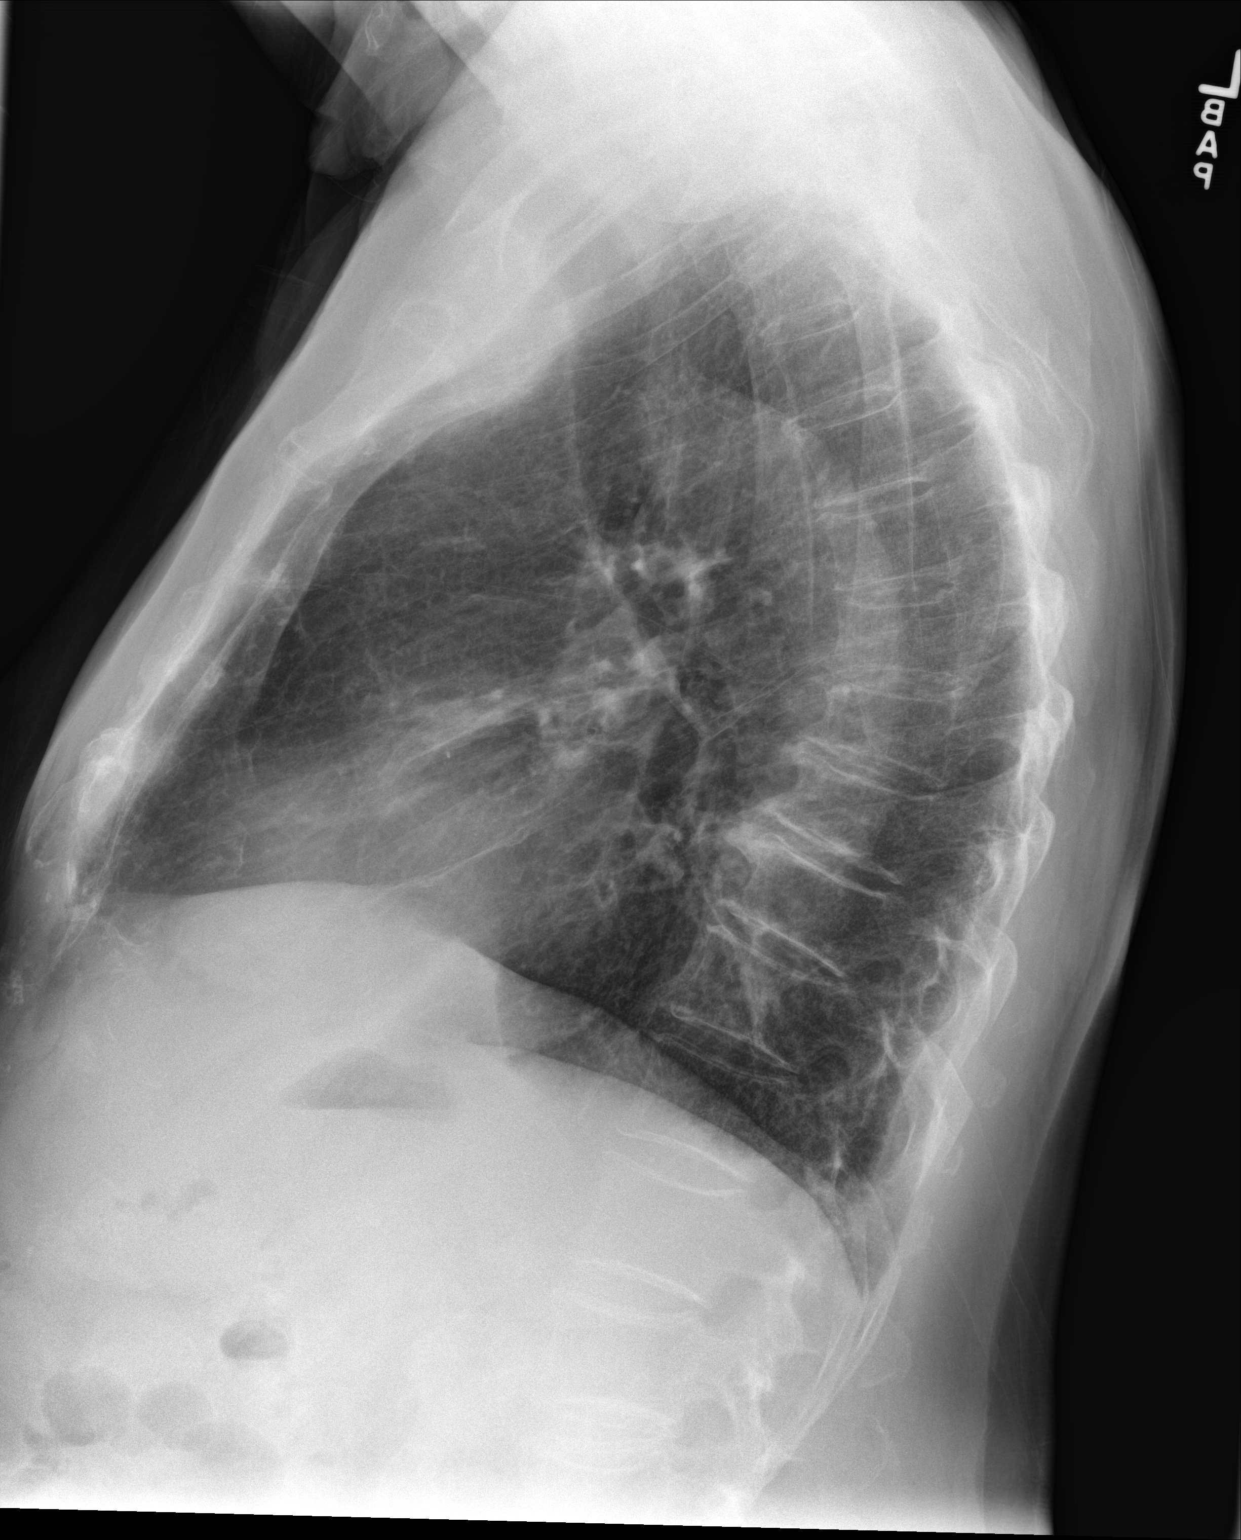

[2 of 2 positions shown; findings below may reference images not displayed]

FINDINGS: Cardiac silhouette and mediastinal contours are unchanged and within
normal limits with calcification again seen within aortic arch and
tortuous descending thoracic aorta. The lungs are well-aerated and
similar to 09/22/2013 with minimal left lateral mid lung linear
scarring. Resolution of the small right-greater-than-left pleural
effusions and basilar opacity seen on 06/17/2021 radiographs. No
pneumothorax is seen. Moderate multilevel degenerative disc changes
of the thoracic spine with unchanged mild wedging of a midthoracic
vertebral body.

Likely interval removal of the two prior right upper quadrant
abdominal drains.
IMPRESSION: No active cardiopulmonary disease. Resolution of the prior pleural
effusions and basilar airspace opacities on 06/17/2021 prior
radiographs.

## 2022-09-26 ENCOUNTER — Other Ambulatory Visit: Payer: Self-pay | Admitting: Internal Medicine

## 2022-09-26 DIAGNOSIS — Z0000 Encounter for general adult medical examination without abnormal findings: Secondary | ICD-10-CM

## 2022-09-26 DIAGNOSIS — R413 Other amnesia: Secondary | ICD-10-CM

## 2022-10-11 ENCOUNTER — Ambulatory Visit
Admission: RE | Admit: 2022-10-11 | Discharge: 2022-10-11 | Disposition: A | Discharge status: Home / Self Care | Payer: Medicare Other | Source: Ambulatory Visit | Attending: Internal Medicine | Admitting: Internal Medicine

## 2022-10-11 DIAGNOSIS — R413 Other amnesia: Secondary | ICD-10-CM | POA: Insufficient documentation

## 2022-10-11 DIAGNOSIS — Z0000 Encounter for general adult medical examination without abnormal findings: Secondary | ICD-10-CM | POA: Diagnosis present

## 2022-11-25 ENCOUNTER — Ambulatory Visit
Admission: EM | Admit: 2022-11-25 | Discharge: 2022-11-25 | Disposition: A | Discharge status: Home / Self Care | Payer: Medicare Other | Attending: Family Medicine | Admitting: Family Medicine

## 2022-11-25 ENCOUNTER — Encounter: Payer: Self-pay | Admitting: Emergency Medicine

## 2022-11-25 DIAGNOSIS — R058 Other specified cough: Secondary | ICD-10-CM | POA: Insufficient documentation

## 2022-11-25 DIAGNOSIS — Z1152 Encounter for screening for COVID-19: Secondary | ICD-10-CM | POA: Insufficient documentation

## 2022-11-25 DIAGNOSIS — J069 Acute upper respiratory infection, unspecified: Secondary | ICD-10-CM | POA: Diagnosis present

## 2022-11-25 DIAGNOSIS — R5383 Other fatigue: Secondary | ICD-10-CM | POA: Diagnosis present

## 2022-11-25 LAB — RESP PANEL BY RT-PCR (RSV, FLU A&B, COVID)  RVPGX2
Influenza A by PCR: NEGATIVE
Influenza B by PCR: NEGATIVE
Resp Syncytial Virus by PCR: NEGATIVE
SARS Coronavirus 2 by RT PCR: NEGATIVE

## 2022-11-25 MED ORDER — CHERATUSSIN AC 100-10 MG/5ML PO SOLN: 10.0000 mL | Freq: Every evening | ORAL | 0 refills | Status: AC | PRN

## 2022-11-25 NOTE — Discharge Instructions (Signed)
-  You are negative for flu, COVID and RSV.  You likely have another virus.  Most the time people feel better within 7 to 10 days.  You may take over-the-counter cough medication or prescription cough medication prescribed.  Plenty of rest and fluids. - You need to be seen again if you develop a fever or have worsening cough, shortness of breath or you are not feeling better in the next 2 weeks.

## 2022-11-25 NOTE — ED Provider Notes (Signed)
[facility]    CSN: [id number] Arrival date & time: 11/25/22  1205      History   Chief Complaint Chief Complaint  Patient presents with   Cough    HPI Shawn Khan is a 81 y.o. male presenting for 3 to 4-day history of cough that is somewhat productive, congestion, postnasal drainage and fatigue.  He states that he was coughing so much and had some of his wife's codeine cough medication from an old prescription which helped him sleep.  He denies any associated fevers and has not had any sore throat, sinus pain, chest pain, shortness of breath, vomiting or diarrhea.  Reports that he wants to leave to go on a 1 month long trip to [place] tomorrow and wanted to see if he was potentially contagious.  He has been around his daughter who has been sick with a cough for over a month.  No other complaints.  HPI  Past Medical History:  Diagnosis Date   Arthritis    Chronic hoarseness    Complication of anesthesia    h/o nasal intubation x 1 hoarseness x 1 week   Contusion of chest    Cough    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   Gastritis    GERD (gastroesophageal reflux disease)    Hemorrhagic stroke ([place])    more than 5 years ago   History of chicken pox    Hyperlipidemia    did not like crestor    Hypertension    Hypoglycemia    Hypogonadism in male    Insomnia    Low testosterone in male    Osteoporosis    Osteoporosis    Prediabetes    Prediabetes    A1C 5.9 06/25/18    Rotator cuff disorder    Sleep apnea    cpap   Stroke ([place])    Vocal cord dysfunction    in 2018 damage per pt    Vocal cord nodule    Zenker diverticulum    1.7 cm    Zenker diverticulum     Patient Active Problem List   Diagnosis Date Noted   Acute cholecystitis 06/12/2021   Aortic atherosclerosis ([place]) 03/02/2019   Bradycardia 12/18/2018   Leg cramps 12/18/2018   Prediabetes 10/03/2018   Barrett's esophagus 10/03/2018   Penile ulcer 09/16/2018   Zenker's diverticulum  09/16/2018   Degenerative joint disease involving multiple joints on both sides of body 04/23/2018   OSA (obstructive sleep apnea) 04/23/2018   Urinary urgency 04/23/2018   Seborrheic keratosis 04/23/2018   Microscopic hematuria 11/20/2017   Acquired phimosis 11/20/2017   Benign essential hypertension 11/20/2017   Benign prostatic hyperplasia with urinary obstruction 11/20/2017   Chronic hoarseness 11/20/2017   Contusion of chest 11/20/2017   Disorder of bone and articular cartilage 11/20/2017   Disorder of rotator cuff 11/20/2017   Dysphagia 11/20/2017   Gastroesophageal reflux disease 11/20/2017   Generalized osteoarthritis 11/20/2017   History of stroke without residual deficits 11/20/2017   Hypoxemia 11/20/2017   Impaired fasting glucose 11/20/2017   Increased frequency of urination 11/20/2017   Insomnia 11/20/2017   Backache 11/20/2017   Mixed hyperlipidemia 11/20/2017   Obstructive sleep apnea syndrome 11/20/2017   Osteoporosis 11/20/2017   Urinary urgency 11/20/2017   Shoulder joint painful on movement 11/20/2017   Seborrheic keratosis 11/20/2017   Rib pain 11/20/2017   Pharyngitis 11/20/2017   Male hypogonadism 11/20/2017    Past Surgical History:  Procedure Laterality Date   APPENDECTOMY  1955   EYE SURGERY     b/l cataract   FEMUR FRACTURE SURGERY     FRACTURE SURGERY     IR GUIDED DRAIN W CATHETER PLACEMENT  06/13/2021   IR PERC CHOLECYSTOSTOMY  06/13/2021   LARYNGOSCOPY  09/17/2017   Procedure: LARYNGOSCOPY;  Surgeon: Beverly Gust, MD;  Location: [facility] ORS;  Service: ENT;;   PENILE BIOPSY     04/92 ulcer with lichenoid inflammation    rcr Bilateral    SHOULDER SURGERY     x3 right and left    THROAT SURGERY     vocal cord nodule   TONSILLECTOMY     TONSILLECTOMY     1948       Home Medications    Prior to Admission medications   Medication Sig Start Date End Date Taking? Authorizing Provider  gabapentin (NEURONTIN) 300 MG capsule Take by  mouth. 05/29/22  Yes [provider]  guaiFENesin-codeine (CHERATUSSIN AC) 100-10 MG/5ML syrup Take 10 mLs by mouth at bedtime as needed for cough. 11/25/22  Yes Danton Clap, PA-C  acetaminophen (TYLENOL) 500 MG tablet Take by mouth. 07/06/21   [provider]  albuterol (VENTOLIN HFA) 108 (90 Base) MCG/ACT inhaler Inhale 1-2 puffs into the lungs every 6 (six) hours as needed for wheezing or shortness of breath. 03/15/22   Marney Setting, NP  alendronate (FOSAMAX) 70 MG tablet Take 70 mg by mouth once a week. Take with a full glass of water on an empty stomach.    [provider]  ascorbic acid (VITAMIN C) 1000 MG tablet Take by mouth.    [provider]  cholecalciferol (VITAMIN D3) 25 MCG (1000 UNIT) tablet Take 1,000 Units by mouth daily.    [provider]  Cholecalciferol 25 MCG (1000 UT) capsule Take by mouth.    [provider]  clotrimazole-betamethasone (LOTRISONE) cream Apply 1 application topically 2 (two) times daily. (Apply to the genital area as directed)    [provider]  finasteride (PROSCAR) 5 MG tablet Take 5 mg by mouth.    [provider]  levocetirizine (XYZAL) 5 MG tablet Take 1 tablet (5 mg total) by mouth every evening. 03/15/22   Marney Setting, NP  moxifloxacin (VIGAMOX) 0.5 % ophthalmic solution Use 2 drops every 2 hours for 2 days.  Then use 2 drops every 6 hours for 5 days. 04/06/22   Lannie Fields, PA-C  Multiple Vitamin (MULTI-VITAMIN) tablet Take by mouth.    [provider]  oxyCODONE (OXY IR/ROXICODONE) 5 MG immediate release tablet Take 1 tablet (5 mg total) by mouth every 6 (six) hours as needed for severe pain or breakthrough pain. 06/15/21   Tylene Fantasia, PA-C  Sodium Chloride Flush (NORMAL SALINE FLUSH) 0.9 % SOLN 5 mLs by Intracatheter route 2 (two) times daily 08/08/21   [provider]  tamsulosin (FLOMAX) 0.4 MG CAPS capsule Take 1 capsule (0.4 mg total)  by mouth 2 (two) times daily. appt further refills 04/01/20   McLean-Scocuzza, Nino Glow, MD  venlafaxine XR (EFFEXOR-XR) 37.5 MG 24 hr capsule Take 37.5 mg by mouth daily.    [provider]  vitamin B-12 (CYANOCOBALAMIN) 1000 MCG tablet Take 1,000 mcg by mouth daily.    [provider]  zinc sulfate 220 (50 Zn) MG capsule Take 220 mg by mouth daily.    [provider]    Family History Family History  Problem Relation Age of Onset   Hearing loss  Father    Other Father        brain tumor   Parkinson's disease Father    Arthritis Father    Cancer Mother        pancreatic    Diabetes Sister    Arthritis Son    Stroke Maternal Grandfather    Stroke Paternal Grandmother    Stroke Paternal Grandfather    Diabetes Sister    Hyperlipidemia Sister    Hypertension Sister    Asthma Daughter    Hypertension Daughter    Asthma Son    Prostate cancer Neg Hx    Bladder Cancer Neg Hx    Kidney cancer Neg Hx     Social History Social History   Tobacco Use   Smoking status: Never   Smokeless tobacco: Never  Vaping Use   Vaping Use: Never used  Substance Use Topics   Alcohol use: No   Drug use: No     Allergies   Patient has no known allergies.   Review of Systems Review of Systems  Constitutional:  Positive for fatigue. Negative for fever.  HENT:  Positive for congestion and rhinorrhea. Negative for sinus pressure, sinus pain and sore throat.   Respiratory:  Positive for cough. Negative for shortness of breath.   Gastrointestinal:  Negative for abdominal pain, diarrhea, nausea and vomiting.  Musculoskeletal:  Negative for myalgias.  Neurological:  Negative for weakness, light-headedness and headaches.  Hematological:  Negative for adenopathy.     Physical Exam Triage Vital Signs ED Triage Vitals  Enc Vitals Group     BP 11/25/22 1341 (!) 143/84     Pulse Rate 11/25/22 1341 82     Resp 11/25/22 1341 15     Temp 11/25/22 1341 99.5 F (37.5  C)     Temp Source 11/25/22 1341 Oral     SpO2 11/25/22 1341 95 %     Weight 11/25/22 1339 191 lb (86.6 kg)     Height 11/25/22 1339 '5\' 9"'$  (1.753 m)     Head Circumference --      Peak Flow --      Pain Score 11/25/22 1339 0     Pain Loc --      Pain Edu? --      Excl. in [place]? --    No data found.  Updated Vital Signs BP (!) 143/84 (BP Location: Right Arm)   Pulse 82   Temp 99.5 F (37.5 C) (Oral)   Resp 15   Ht '5\' 9"'$  (1.753 m)   Wt 191 lb (86.6 kg)   SpO2 95%   BMI 28.21 kg/m      Physical Exam Vitals and nursing note reviewed.  Constitutional:      General: He is not in acute distress.    Appearance: Normal appearance. He is well-developed. He is not ill-appearing.  HENT:     Head: Normocephalic and atraumatic.     Nose: Congestion present.     Mouth/Throat:     Mouth: Mucous membranes are moist.     Pharynx: Oropharynx is clear.  Eyes:     General: No scleral icterus.    Conjunctiva/sclera: Conjunctivae normal.  Cardiovascular:     Rate and Rhythm: Normal rate and regular rhythm.  Pulmonary:     Effort: Pulmonary effort is normal. No respiratory distress.     Breath sounds: Normal breath sounds. No wheezing, rhonchi or rales.  Musculoskeletal:     Cervical back: Neck supple.  Skin:  General: Skin is warm and dry.     Capillary Refill: Capillary refill takes less than 2 seconds.  Neurological:     General: No focal deficit present.     Mental Status: He is alert. Mental status is at baseline.     Motor: No weakness.     Gait: Gait normal.  Psychiatric:        Mood and Affect: Mood normal.        Behavior: Behavior normal.      UC Treatments / Results  Labs (all labs ordered are listed, but only abnormal results are displayed) Labs Reviewed  RESP PANEL BY RT-PCR (RSV, FLU A&B, COVID)  [id number]    EKG   Radiology No results found.  Procedures Procedures (including critical care time)  Medications Ordered in UC Medications - No data to  display  Initial Impression / Assessment and Plan / UC Course  I have reviewed the triage vital signs and the nursing notes.  Pertinent labs & imaging results that were available during my care of the patient were reviewed by me and considered in my medical decision making (see chart for details).   81 year old male presenting for cough, congestion and fatigue for the past 3 to 4 days.  No associated fever, sore throat, sinus pain, chest pain or breathing difficulty.  Daughter has been sick with a cough for about a month.  He is afebrile and overall well-appearing.  In no acute distress.  On exam he has nasal congestion.  Throat is clear.  Chest clear auscultation heart regular rate and rhythm.  PCR respiratory panel is negative for flu A, flu B, RSV and COVID-19.  Discussed all results with patient.  Suspect other viral URI.  Supportive care encouraged.  Sent Cheratussin.  This is for nighttime use only.  Advised him not to take before he drives.  Encourage plenty of rest and fluids.  Advised returning or seeking follow-up elsewhere if he develops a fever or has worsening cough or shortness of breath.   Final Clinical Impressions(s) / UC Diagnoses   Final diagnoses:  Viral URI with cough  Other fatigue     Discharge Instructions      -You are negative for flu, COVID and RSV.  You likely have another virus.  Most the time people feel better within 7 to 10 days.  You may take over-the-counter cough medication or prescription cough medication prescribed.  Plenty of rest and fluids. - You need to be seen again if you develop a fever or have worsening cough, shortness of breath or you are not feeling better in the next 2 weeks.     ED Prescriptions     Medication Sig Dispense Auth. Provider   guaiFENesin-codeine (CHERATUSSIN AC) 100-10 MG/5ML syrup Take 10 mLs by mouth at bedtime as needed for cough. 118 mL Danton Clap, PA-C      I have reviewed the PDMP during this  encounter.   Danton Clap, PA-C 11/25/22 1434

## 2022-11-25 NOTE — ED Triage Notes (Signed)
Patient c/o cough and chest congestion for the past 3-4 days.

## 2023-03-28 ENCOUNTER — Other Ambulatory Visit: Payer: Self-pay | Admitting: Nurse Practitioner

## 2023-03-28 ENCOUNTER — Other Ambulatory Visit: Payer: Self-pay

## 2023-03-28 DIAGNOSIS — R1084 Generalized abdominal pain: Secondary | ICD-10-CM

## 2023-03-28 DIAGNOSIS — R140 Abdominal distension (gaseous): Secondary | ICD-10-CM

## 2023-03-29 ENCOUNTER — Ambulatory Visit
Admission: RE | Admit: 2023-03-29 | Discharge: 2023-03-29 | Disposition: A | Discharge status: Home / Self Care | Payer: Medicare Other | Source: Ambulatory Visit | Attending: Nurse Practitioner | Admitting: Nurse Practitioner

## 2023-03-29 DIAGNOSIS — R1084 Generalized abdominal pain: Secondary | ICD-10-CM

## 2023-03-29 DIAGNOSIS — R140 Abdominal distension (gaseous): Secondary | ICD-10-CM

## 2023-03-29 MED ORDER — IOPAMIDOL (ISOVUE-300) INJECTION 61%
100.0000 mL | Freq: Once | INTRAVENOUS | Status: AC | PRN
  Administered 2023-03-29: 100 mL via INTRAVENOUS

## 2023-10-24 ENCOUNTER — Other Ambulatory Visit: Payer: Self-pay | Admitting: Internal Medicine

## 2023-10-24 DIAGNOSIS — R1310 Dysphagia, unspecified: Secondary | ICD-10-CM

## 2023-10-29 ENCOUNTER — Ambulatory Visit
Admission: RE | Admit: 2023-10-29 | Discharge: 2023-10-29 | Disposition: A | Discharge status: Home / Self Care | Payer: Medicare Other | Source: Ambulatory Visit | Attending: Internal Medicine | Admitting: Internal Medicine

## 2023-10-29 DIAGNOSIS — R1310 Dysphagia, unspecified: Secondary | ICD-10-CM | POA: Insufficient documentation

## 2023-11-29 NOTE — Therapy (Incomplete)
OUTPATIENT PHYSICAL THERAPY BALANCE EVALUATION   Patient Name: Shawn Khan MRN: [id number] DOB:13-May-1941, 82 y.o., male Today's Date: 11/29/2023  END OF SESSION:   Past Medical History:  Diagnosis Date   Arthritis    Chronic hoarseness    Complication of anesthesia    h/o nasal intubation x 1 hoarseness x 1 week   Contusion of chest    Cough    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   Gastritis    GERD (gastroesophageal reflux disease)    Hemorrhagic stroke ([facility])    more than 5 years ago   History of chicken pox    Hyperlipidemia    did not like crestor    Hypertension    Hypoglycemia    Hypogonadism in male    Insomnia    Low testosterone in male    Osteoporosis    Osteoporosis    Prediabetes    Prediabetes    A1C 5.9 06/25/18    Rotator cuff disorder    Sleep apnea    cpap   Stroke [facility])    Vocal cord dysfunction    in 2018 damage per pt    Vocal cord nodule    Zenker diverticulum    1.7 cm    Zenker diverticulum    Past Surgical History:  Procedure Laterality Date   APPENDECTOMY     1955   EYE SURGERY     b/l cataract   FEMUR FRACTURE SURGERY     FRACTURE SURGERY     IR GUIDED DRAIN W CATHETER PLACEMENT  06/13/2021   IR PERC CHOLECYSTOSTOMY  06/13/2021   LARYNGOSCOPY  09/17/2017   Procedure: LARYNGOSCOPY;  Surgeon: Linus Salmons, MD;  Location: [facility] ORS;  Service: ENT;;   PENILE BIOPSY     04/2018 ulcer with lichenoid inflammation    rcr Bilateral    SHOULDER SURGERY     x3 right and left    THROAT SURGERY     vocal cord nodule   TONSILLECTOMY     TONSILLECTOMY     1948   Patient Active Problem List   Diagnosis Date Noted   Acute cholecystitis 06/12/2021   Aortic atherosclerosis ([facility]) 03/02/2019   Bradycardia 12/18/2018   Leg cramps 12/18/2018   Prediabetes 10/03/2018   Barrett's esophagus 10/03/2018   Penile ulcer 09/16/2018   Zenker's diverticulum 09/16/2018   Degenerative joint disease involving multiple joints on both sides of body  04/23/2018   OSA (obstructive sleep apnea) 04/23/2018   Urinary urgency 04/23/2018   Seborrheic keratosis 04/23/2018   Microscopic hematuria 11/20/2017   Acquired phimosis of penis 11/20/2017   Benign essential hypertension 11/20/2017   Benign prostatic hyperplasia with urinary obstruction 11/20/2017   Chronic hoarseness 11/20/2017   Contusion of chest 11/20/2017   Disorder of bone and articular cartilage 11/20/2017   Disorder of rotator cuff 11/20/2017   Dysphagia 11/20/2017   Gastroesophageal reflux disease 11/20/2017   Generalized osteoarthritis 11/20/2017   History of stroke without residual deficits 11/20/2017   Hypoxemia 11/20/2017   Impaired fasting glucose 11/20/2017   Increased frequency of urination 11/20/2017   Insomnia 11/20/2017   Backache 11/20/2017   Mixed hyperlipidemia 11/20/2017   Obstructive sleep apnea syndrome 11/20/2017   Osteoporosis 11/20/2017   Urinary urgency 11/20/2017   Shoulder joint painful on movement 11/20/2017   Seborrheic keratosis 11/20/2017   Rib pain 11/20/2017   Pharyngitis 11/20/2017   Male hypogonadism 11/20/2017    PCP: Marguarite Arbour, MD  REFERRING PROVIDER: Lonell Face, MD  REFERRING DIAG: R53.81 (ICD-10-CM) - Other malaise   RATIONALE FOR EVALUATION AND TREATMENT: Rehabilitation  THERAPY DIAG: No diagnosis found.  ONSET DATE: ***  FOLLOW-UP APPT SCHEDULED WITH REFERRING PROVIDER: Yes    SUBJECTIVE:                                                                                                                                                                                         SUBJECTIVE STATEMENT:  ***  PERTINENT HISTORY:  5. Concern for Deconditioning  We will order physical therapy   6. Wellness Discussed exercise precautions regarding balance and fall prevention. Recommend exercising with a partner. Also recommend using stationary bikes and avoiding treadmills due to risk of injury.   10/12/2023 MRI  Brain without contrast  IMPRESSION:  1. No evidence of acute intracranial abnormality.  2. Chronic hemorrhage or chronic hemorrhagic infarct within the left  subinsular white matter/basal ganglia.  3. Minimal chronic small-vessel ischemic changes elsewhere within  the cerebral white matter.  4. Mild generalized cerebral atrophy.     Pain: {yes/no:20286} Numbness/Tingling: {yes/no:20286} Focal Weakness: {yes/no:20286} Recent changes in overall health/medication: {yes/no:20286} Prior history of physical therapy for balance:  {yes/no:20286} Dominant hand: {RIGHT/LEFT:20294} Imaging: {yes/no:20286}  Red flags: Negative for bowel/bladder changes, saddle paresthesia, personal history of cancer, h/o spinal tumors, h/o compression fx, h/o abdominal aneurysm, abdominal pain, chills/fever, night sweats, nausea, vomiting, unrelenting pain, first onset of insidious LBP <20 y/o  PRECAUTIONS: Fall  WEIGHT BEARING RESTRICTIONS: No  FALLS: Has patient fallen in last 6 months? {fallsyesno:27318}, Directional pattern for falls: {Yes ***/No:24003}  Living Environment Lives with: {[facility] lives with:25569::"lives with their family"} Lives in: {Lives in:25570} Stairs: {opstairs:27293} Has following equipment at home: {Assistive devices:23999}  Prior level of function: {PLOF:24004}  Occupational demands:   Hobbies:   Patient Goals: ***   OBJECTIVE:   Patient Surveys  FOTO: ,predicted improvement to  ABC:   Cognition Patient is oriented to person, place, and time.  Recent memory is intact.  Remote memory is intact.  Attention span and concentration are intact.  Expressive speech is intact.  Patient's fund of knowledge is within normal limits for educational level.    Gross Musculoskeletal Assessment Tremor: None Bulk: Normal Tone: Normal  Posture: No gross abnormalities noted in standing or seated posture  AROM AROM (Normal range in degrees) AROM   Lumbar   Flexion (65)    Extension (30)   Right lateral flexion (25)   Left lateral flexion (25)   Right rotation (30)   Left rotation (30)       Hip Right Left  Flexion (125)    Extension (15)  Abduction (40)    Adduction     Internal Rotation (45)    External Rotation (45)        Knee    Flexion (135)    Extension (0)        Ankle    Dorsiflexion (20)    Plantarflexion (50)    Inversion (35)    Eversion (15)    (* = pain; Blank rows = not tested)  LE MMT: MMT (out of 5) Right  Left   Hip flexion    Hip extension    Hip abduction    Hip adduction    Hip internal rotation    Hip external rotation    Knee flexion    Knee extension    Ankle dorsiflexion    Ankle plantarflexion    Ankle inversion    Ankle eversion    (* = pain; Blank rows = not tested)  Sensation Grossly intact to light touch throughout bilateral LEs as determined by testing dermatomes L2-S2. Proprioception, stereognosis, and hot/cold testing deferred on this date.  Reflexes R/L Knee Jerk (L3/4): 2+/2+  Ankle Jerk (S1/2): 2+/2+   Cranial Nerves Visual acuity and visual fields are intact  Extraocular muscles are intact  Facial sensation is intact bilaterally  Facial strength is intact bilaterally  Hearing is normal as tested by gross conversation Palate elevates midline, normal phonation  Shoulder shrug strength is intact  Tongue protrudes midline  Coordination/Cerebellar Finger to Nose: WNL Heel to Shin: WNL Rapid alternating movements: WNL Finger Opposition: WNL Pronator Drift: Negative  Bed mobility: {Bed mobility:24027}  Transfers: Assistive device utilized: {Assistive devices:23999}  Sit to stand: {Levels of assistance:24026} Stand to sit: {Levels of assistance:24026} Chair to chair: {Levels of assistance:24026} Floor: {Levels of assistance:24026}  Curb:  Level of Assistance: {Levels of assistance:24026} Assistive device utilized: {Assistive devices:23999} Curb Comments:  ***  Stairs: Level of Assistance: {Levels of assistance:24026} Stair Negotiation Technique: {Stair Technique:27161} with {Rail Assistance:27162} Number of Stairs: ***  Height of Stairs: ***  Comments: ***  Gait: Gait pattern: {gait characteristics:25376} Distance walked: *** Assistive device utilized: {Assistive devices:23999} Level of assistance: {Levels of assistance:24026} Comments: ***  Functional Outcome Measures  Results Comments  BERG /56   DGI /24   FGA /30   TUG seconds   5TSTS seconds   6 Minute Walk Test    10 Meter Gait Speed Self-selected: s = m/s; Fastest: s = m/s   (Blank rows = not tested)   TODAY'S TREATMENT  Deferred   PATIENT EDUCATION:  Education details: Plan of care Person educated: Patient Education method: Explanation Education comprehension: verbalized understanding   HOME EXERCISE PROGRAM:    ASSESSMENT:  CLINICAL IMPRESSION: Patient is a 82 y.o. male who was seen today for physical therapy evaluation and treatment for physical deconditioning.   OBJECTIVE IMPAIRMENTS: {opptimpairments:25111}.   ACTIVITY LIMITATIONS: {activitylimitations:27494}  PARTICIPATION LIMITATIONS: {participationrestrictions:25113}  PERSONAL FACTORS: {Personal factors:25162} are also affecting patient's functional outcome.   REHAB POTENTIAL: {rehabpotential:25112}  CLINICAL DECISION MAKING: {clinical decision making:25114}  EVALUATION COMPLEXITY: {Evaluation complexity:25115}   GOALS: Goals reviewed with patient? No  SHORT TERM GOALS: Target date: 01/10/2024  Pt will be independent with HEP in order to improve strength and balance in order to decrease fall risk and improve function at home. Baseline: *** Goal status: INITIAL   LONG TERM GOALS: Target date: 02/21/2024  Pt will increase FOTO to at least *** to demonstrate significant improvement in function at home related to balance  Baseline:  Goal  status: INITIAL  2.  Pt will improve BERG by  at least 3 points in order to demonstrate clinically significant improvement in balance.   Baseline: *** Goal status: INITIAL  3.  Pt will improve ABC by at least 13% in order to demonstrate clinically significant improvement in balance confidence.      Baseline: *** Goal status: INITIAL  4. Pt will decrease 5TSTS by at least 3 seconds in order to demonstrate clinically significant improvement in LE strength      Baseline: *** Goal status: INITIAL  5. Pt will improve DGI by at least 3 points in order to demonstrate clinically significant improvement in balance and decreased risk for falls.     Baseline: *** Goal status: INITIAL  6. Pt will decrease TUG to below 14 seconds/decrease in order to demonstrate decreased fall risk.  Baseline: *** Goal status: INITIAL  7. Pt will increase by at least 3m (131ft) in order to demonstrate clinically significant improvement in cardiopulmonary endurance and community ambulation   Baseline: *** Goal status: INITIAL   PLAN: PT FREQUENCY: 2x/week  PT DURATION: 8 weeks  PLANNED INTERVENTIONS: Therapeutic exercises, Therapeutic activity, Neuromuscular re-education, Balance training, Gait training, Patient/Family education, Self Care, Joint mobilization, Joint manipulation, Vestibular training, Canalith repositioning, Orthotic/Fit training, DME instructions, Dry Needling, Electrical stimulation, Spinal manipulation, Spinal mobilization, Cryotherapy, Moist heat, Taping, Traction, Ultrasound, Ionotophoresis 4mg /ml Dexamethasone, Manual therapy, and Re-evaluation.  PLAN FOR NEXT SESSION: ***   Sharalyn Ink Vanesha [place] PT, DPT, GCS  Charlize Hathaway 11/29/2023, 12:45 PM

## 2023-12-02 ENCOUNTER — Ambulatory Visit: Payer: Medicare Other

## 2023-12-03 NOTE — Therapy (Signed)
OUTPATIENT PHYSICAL THERAPY BALANCE EVALUATION   Patient Name: Shawn Khan MRN: [id number] DOB:1941/01/23, 82 y.o., male Today's Date: 12/05/2023  END OF SESSION:  PT End of Session - 12/04/23 1150     Visit Number 1    Number of Visits 25    Date for PT Re-Evaluation 02/26/24    Authorization Type eval: 12/04/23    PT Start Time 1152    PT Stop Time 1230    PT Time Calculation (min) 38 min    Equipment Utilized During Treatment Gait belt    Activity Tolerance Patient tolerated treatment well    Behavior During Therapy WFL for tasks assessed/performed            Past Medical History:  Diagnosis Date   Arthritis    Chronic hoarseness    Complication of anesthesia    h/o nasal intubation x 1 hoarseness x 1 week   Contusion of chest    Cough    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   Gastritis    GERD (gastroesophageal reflux disease)    Hemorrhagic stroke (HCC)    more than 5 years ago   History of chicken pox    Hyperlipidemia    did not like crestor    Hypertension    Hypoglycemia    Hypogonadism in male    Insomnia    Low testosterone in male    Osteoporosis    Osteoporosis    Prediabetes    Prediabetes    A1C 5.9 06/25/18    Rotator cuff disorder    Sleep apnea    cpap   Stroke [facility])    Vocal cord dysfunction    in 2018 damage per pt    Vocal cord nodule    Zenker diverticulum    1.7 cm    Zenker diverticulum    Past Surgical History:  Procedure Laterality Date   APPENDECTOMY     1955   EYE SURGERY     b/l cataract   FEMUR FRACTURE SURGERY     FRACTURE SURGERY     IR GUIDED DRAIN W CATHETER PLACEMENT  06/13/2021   IR PERC CHOLECYSTOSTOMY  06/13/2021   LARYNGOSCOPY  09/17/2017   Procedure: LARYNGOSCOPY;  Surgeon: Linus Salmons, MD;  Location: [facility] ORS;  Service: ENT;;   PENILE BIOPSY     04/2018 ulcer with lichenoid inflammation    rcr Bilateral    SHOULDER SURGERY     x3 right and left    THROAT SURGERY     vocal cord nodule   TONSILLECTOMY      TONSILLECTOMY     1948   Patient Active Problem List   Diagnosis Date Noted   Acute cholecystitis 06/12/2021   Aortic atherosclerosis (HCC) 03/02/2019   Bradycardia 12/18/2018   Leg cramps 12/18/2018   Prediabetes 10/03/2018   Barrett's esophagus 10/03/2018   Penile ulcer 09/16/2018   Zenker's diverticulum 09/16/2018   Degenerative joint disease involving multiple joints on both sides of body 04/23/2018   OSA (obstructive sleep apnea) 04/23/2018   Urinary urgency 04/23/2018   Seborrheic keratosis 04/23/2018   Microscopic hematuria 11/20/2017   Acquired phimosis of penis 11/20/2017   Benign essential hypertension 11/20/2017   Benign prostatic hyperplasia with urinary obstruction 11/20/2017   Chronic hoarseness 11/20/2017   Contusion of chest 11/20/2017   Disorder of bone and articular cartilage 11/20/2017   Disorder of rotator cuff 11/20/2017   Dysphagia 11/20/2017   Gastroesophageal reflux disease 11/20/2017   Generalized osteoarthritis 11/20/2017  History of stroke without residual deficits 11/20/2017   Hypoxemia 11/20/2017   Impaired fasting glucose 11/20/2017   Increased frequency of urination 11/20/2017   Insomnia 11/20/2017   Backache 11/20/2017   Mixed hyperlipidemia 11/20/2017   Obstructive sleep apnea syndrome 11/20/2017   Osteoporosis 11/20/2017   Urinary urgency 11/20/2017   Shoulder joint painful on movement 11/20/2017   Seborrheic keratosis 11/20/2017   Rib pain 11/20/2017   Pharyngitis 11/20/2017   Male hypogonadism 11/20/2017    PCP: Marguarite Arbour, MD  REFERRING PROVIDER: Lonell Face, MD   REFERRING DIAG: R53.81 (ICD-10-CM) - Other malaise   RATIONALE FOR EVALUATION AND TREATMENT: Rehabilitation  THERAPY DIAG: Muscle weakness (generalized) - Plan: PT plan of care cert/re-cert  ONSET DATE: 1 year (approximate)  FOLLOW-UP APPT SCHEDULED WITH REFERRING PROVIDER: Yes    SUBJECTIVE:                                                                                                                                                                                          SUBJECTIVE STATEMENT:  Progressive weakness  PERTINENT HISTORY:  Pt reports progressive weakness for the last year as well as issues with endurance. No known cause. History of chronic bilateral knee pain and L shoulder pain s/p surgery. He also complains of bilateral wrist pain. History of CVA "30 years ago." He has some residual RUE numbness from the CVA but otherwise no residual deficits. Pt reports that he struggles with his memory. He has been seeing floaters recently and has an appt with an eye doctor today after his PT evaluation.   10/12/2023 MRI Brain without contrast  IMPRESSION:  1. No evidence of acute intracranial abnormality.  2. Chronic hemorrhage or chronic hemorrhagic infarct within the left  subinsular white matter/basal ganglia.  3. Minimal chronic small-vessel ischemic changes elsewhere within  the cerebral white matter.  4. Mild generalized cerebral atrophy.  Pain: Yes, bilateral knee pain Numbness/Tingling: Yes, RUE Focal Weakness: No Recent changes in overall health/medication: No Prior history of physical therapy for balance:  No Dominant hand: right Imaging: Yes  Red flags: Negative for bowel/bladder changes, saddle paresthesia, personal history of cancer, h/o spinal tumors, h/o compression fx, h/o abdominal aneurysm, abdominal pain, chills/fever, night sweats, nausea, vomiting, unrelenting pain, first onset of insidious LBP <20 y/o  PRECAUTIONS: Fall  WEIGHT BEARING RESTRICTIONS: No  FALLS: Has patient fallen in last 6 months? No  Living Environment Lives with: lives with their spouse, daughter and granddaughter next door. Grandson lives 10 minutes away. Lives in: House/apartment, ramp as well as 9 steps with bilateral wide rails Has following equipment at home: Single point  cane, Walker - 2 wheeled, Walker - 4 wheeled, and power.  Walk in shower with seat and grab bars  Prior level of function: Independent  Occupational demands: Retired Curator for [employer]: [place], playing games, helping daughter with her Armed forces technical officer business, caring for his chickens  Patient Goals: "Get back to gardening" Pt would like to improve his endurance   OBJECTIVE:   Patient Surveys  FOTO: 58, predicted improvement to 63 ABC: To be completed (given to pt to take home)  Cognition Patient is oriented to person, place, and time.  Recent memory is intact.  Remote memory is intact.  Attention span and concentration are intact.  Expressive speech is intact.  Patient's fund of knowledge is within normal limits for educational level.    Gross Musculoskeletal Assessment Tremor: None Bulk: Normal Tone: Normal  Posture: No gross abnormalities noted in standing or seated posture  AROM Deferred  LE MMT: MMT (out of 5) Right  Left   Hip flexion 4 4  Hip extension    Hip abduction (seated) 4 4  Hip adduction (seated) 4 4  Hip internal rotation    Hip external rotation    Knee flexion (seated) 5 5  Knee extension 5 5  Ankle dorsiflexion 5 5  Ankle plantarflexion    Ankle inversion    Ankle eversion    (* = pain; Blank rows = not tested)  UE MMT grossly WNL and symmetrical  Sensation Deferred  Reflexes Deferred  Cranial Nerves Deferred  Coordination/Cerebellar Deferred  Bed mobility: Deferred  Transfers: Assistive device utilized: None  Sit to stand: Complete Independence Stand to sit: Complete Independence Chair to chair: Complete Independence Deferred  Curb:  Deferred  Stairs: Level of Assistance: Modified independence Stair Negotiation Technique: Alternating Pattern  with Bilateral Rails Number of Stairs: 4  Height of Stairs: 6"  Comments: No safety concerns noted  Gait: Gait pattern: WFL Distance walked: 100' Assistive device utilized: None Level of assistance:  Complete Independence Comments: No gross deficits identified  Functional Outcome Measures  Results Comments  BERG 53/56 Mild deficits  DGI 23/24 WNL  FGA    TUG 10.7 seconds WNL  5TSTS 26.2 seconds Bilateral knee pain  6 Minute Walk Test    10 Meter Gait Speed Self-selected: 9.9 s = 1.01 m/s; Fastest: 7.9 s = 1.27 m/s WNL  (Blank rows = not tested)   TODAY'S TREATMENT  Deferred   PATIENT EDUCATION:  Education details: Plan of care and examination findings Person educated: Patient Education method: Explanation Education comprehension: verbalized understanding   HOME EXERCISE PROGRAM:  None currently   ASSESSMENT:  CLINICAL IMPRESSION: Patient is a 82 y.o. male who was seen today for physical therapy evaluation and treatment for physical deconditioning.   OBJECTIVE IMPAIRMENTS: decreased balance, decreased endurance, decreased strength, and pain.   ACTIVITY LIMITATIONS: standing  PARTICIPATION LIMITATIONS: cleaning, shopping, and community activity  PERSONAL FACTORS: Age, Time since onset of injury/illness/exacerbation, and 3+ comorbidities: OA, OSA, and memory impairments  are also affecting patient's functional outcome.   REHAB POTENTIAL: Good  CLINICAL DECISION MAKING: Evolving/moderate complexity  EVALUATION COMPLEXITY: Moderate   GOALS: Goals reviewed with patient? No  SHORT TERM GOALS: Target date: 01/15/2024  Pt will be independent with HEP in order to improve strength and balance in order to decrease fall risk and improve function at home. Baseline:  Goal status: INITIAL   LONG TERM GOALS: Target date: 02/26/2024  Pt will increase FOTO to at least 63  to demonstrate significant improvement in function at home related to balance  Baseline: 58 Goal status: INITIAL  2.  Pt will improve single leg balance to >10s on both legs in order to demonstrate clinically significant improvement in balance.   Baseline: 5-10s on each side; Goal status:  INITIAL  3.  Pt will improve ABC by at least 13% in order to demonstrate clinically significant improvement in balance confidence.      Baseline: To be completed Goal status: INITIAL  4. Pt will decrease 5TSTS by at least 3 seconds in order to demonstrate clinically significant improvement in LE strength      Baseline: 26.2s Goal status: INITIAL   PLAN: PT FREQUENCY: 1-2x/week  PT DURATION: 12 weeks  PLANNED INTERVENTIONS: Therapeutic exercises, Therapeutic activity, Neuromuscular re-education, Balance training, Gait training, Patient/Family education, Self Care, Joint mobilization, Joint manipulation, Vestibular training, Canalith repositioning, Orthotic/Fit training, DME instructions, Dry Needling, Electrical stimulation, Spinal manipulation, Spinal mobilization, Cryotherapy, Moist heat, Taping, Traction, Ultrasound, Ionotophoresis 4mg /ml Dexamethasone, Manual therapy, and Re-evaluation.  PLAN FOR NEXT SESSION: 30s sit to stand test, MiniBEST, initiate balance and strengthening, issue HEP;   Sharalyn Ink Yanixan Mellinger PT, DPT, GCS  Franca Stakes 12/05/2023, 11:47 AM

## 2023-12-04 ENCOUNTER — Ambulatory Visit: Payer: Medicare Other | Attending: Neurology

## 2023-12-04 DIAGNOSIS — M6281 Muscle weakness (generalized): Secondary | ICD-10-CM | POA: Insufficient documentation

## 2023-12-06 ENCOUNTER — Encounter: Payer: Self-pay | Admitting: Gastroenterology

## 2023-12-08 NOTE — H&P (Signed)
Pre-Procedure H&P   Patient ID: Shawn Khan is a 82 y.o. male.  Gastroenterology Provider: [name], DO  Referring Provider: Fransico Setters, NP PCP: Marguarite Arbour, MD  Date: 12/09/2023  HPI [name] is a 82 y.o. male who presents today for Esophagogastroduodenoscopy for Dysphagia, abnormal barium swallow .  Patient with longstanding dysphagia.  He has a history of Zenker's diverticulum with repair.  More recently has had worsening dysphagia.  Underwent barium swallow study in November demonstrating a narrow GEJ that the barium tablet was unable to pass suspicious for stricture.  Zenker's and pharyngeal diverticula were also present.  Esophageal dysmotility also noted.  Pt had tea this morning so procedure was delayed  Creatinine 1.0 hemoglobin 14.6 MCV 96 platelets 204,000   Past Medical History:  Diagnosis Date   Arthritis    Chronic hoarseness    Complication of anesthesia    h/o nasal intubation x 1 hoarseness x 1 week   Contusion of chest    Cough    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   Gastritis    GERD (gastroesophageal reflux disease)    Hemorrhagic stroke (HCC)    more than 5 years ago   History of chicken pox    Hyperlipidemia    did not like crestor    Hypertension    Hypoglycemia    Hypogonadism in male    Insomnia    Low testosterone in male    Osteoporosis    Prediabetes    A1C 5.9 06/25/18    Rotator cuff disorder    Sleep apnea    cpap   Stroke [facility])    Vocal cord dysfunction    in 2018 damage per pt    Vocal cord nodule    Zenker diverticulum    1.7 cm     Past Surgical History:  Procedure Laterality Date   APPENDECTOMY     1955   EYE SURGERY     b/l cataract   FEMUR FRACTURE SURGERY     FRACTURE SURGERY     IR GUIDED DRAIN W CATHETER PLACEMENT  06/13/2021   IR PERC CHOLECYSTOSTOMY  06/13/2021   LARYNGOSCOPY  09/17/2017   Procedure: LARYNGOSCOPY;  Surgeon: Linus Salmons, MD;  Location: [facility] ORS;  Service:  ENT;;   PENILE BIOPSY     04/2018 ulcer with lichenoid inflammation    rcr Bilateral    SHOULDER SURGERY     x3 right and left    THROAT SURGERY     vocal cord nodule   TONSILLECTOMY     1948    Family History No h/o GI disease or malignancy  Review of Systems  Constitutional:  Negative for activity change, appetite change, chills, diaphoresis, fatigue, fever and unexpected weight change.  HENT:  Positive for trouble swallowing. Negative for voice change.   Respiratory:  Negative for shortness of breath and wheezing.   Cardiovascular:  Negative for chest pain, palpitations and leg swelling.  Gastrointestinal:  Negative for abdominal distention, abdominal pain, anal bleeding, blood in stool, constipation, diarrhea, nausea and vomiting.  Musculoskeletal:  Negative for arthralgias and myalgias.  Skin:  Negative for color change and pallor.  Neurological:  Negative for dizziness, syncope and weakness.  Psychiatric/Behavioral:  Negative for confusion. The patient is not nervous/anxious.   All other systems reviewed and are negative.    Medications No current facility-administered medications on file prior to encounter.   Current Outpatient Medications on File Prior to Encounter  Medication Sig Dispense Refill   acetaminophen (TYLENOL) 500 MG tablet Take by mouth.     albuterol (VENTOLIN HFA) 108 (90 Base) MCG/ACT inhaler Inhale 1-2 puffs into the lungs every 6 (six) hours as needed for wheezing or shortness of breath. 18 g 0   alendronate (FOSAMAX) 70 MG tablet Take 70 mg by mouth once a week. Take with a full glass of water on an empty stomach.     ascorbic acid (VITAMIN C) 1000 MG tablet Take by mouth.     cholecalciferol (VITAMIN D3) 25 MCG (1000 UNIT) tablet Take 1,000 Units by mouth daily.     Cholecalciferol 25 MCG (1000 UT) capsule Take by mouth.     clotrimazole-betamethasone (LOTRISONE) cream Apply 1 application topically 2 (two) times daily. (Apply to the genital area as  directed)     finasteride (PROSCAR) 5 MG tablet Take 5 mg by mouth. (Patient not taking: Reported on 12/09/2023)     gabapentin (NEURONTIN) 300 MG capsule Take by mouth.     guaiFENesin-codeine (CHERATUSSIN AC) 100-10 MG/5ML syrup Take 10 mLs by mouth at bedtime as needed for cough. 118 mL 0   levocetirizine (XYZAL) 5 MG tablet Take 1 tablet (5 mg total) by mouth every evening. 30 tablet 0   moxifloxacin (VIGAMOX) 0.5 % ophthalmic solution Use 2 drops every 2 hours for 2 days.  Then use 2 drops every 6 hours for 5 days. 3 mL 0   Multiple Vitamin (MULTI-VITAMIN) tablet Take by mouth.     oxyCODONE (OXY IR/ROXICODONE) 5 MG immediate release tablet Take 1 tablet (5 mg total) by mouth every 6 (six) hours as needed for severe pain or breakthrough pain. 20 tablet 0   Sodium Chloride Flush (NORMAL SALINE FLUSH) 0.9 % SOLN 5 mLs by Intracatheter route 2 (two) times daily     tamsulosin (FLOMAX) 0.4 MG CAPS capsule Take 1 capsule (0.4 mg total) by mouth 2 (two) times daily. appt further refills 180 capsule 1   venlafaxine XR (EFFEXOR-XR) 37.5 MG 24 hr capsule Take 37.5 mg by mouth daily.     vitamin B-12 (CYANOCOBALAMIN) 1000 MCG tablet Take 1,000 mcg by mouth daily.     zinc sulfate 220 (50 Zn) MG capsule Take 220 mg by mouth daily.      Pertinent medications related to GI and procedure were reviewed by me with the patient prior to the procedure   Current Facility-Administered Medications:    0.9 %  sodium chloride infusion, , Intravenous, Continuous, [name], DO, Last Rate: 20 mL/hr at 12/09/23 0755, New Bag at 12/09/23 0755  sodium chloride 20 mL/hr at 12/09/23 0755       No Known Allergies Allergies were reviewed by me prior to the procedure  Objective   Body mass index is 28.51 kg/m. Vitals:   12/09/23 0722 12/09/23 0746  BP:  (!) 149/95  Pulse:  (!) 51  Resp:  18  Temp:  (!) 96.7 F (35.9 C)  TempSrc:  Temporal  SpO2:  100%  Weight: 82.6 kg   Height: 5\' 7"  (1.702  m)      Physical Exam Vitals and nursing note reviewed.  Constitutional:      General: He is not in acute distress.    Appearance: Normal appearance. He is not ill-appearing, toxic-appearing or diaphoretic.  HENT:     Head: Normocephalic and atraumatic.     Nose: Nose normal.     Mouth/Throat:     Mouth: Mucous membranes are moist.  Pharynx: Oropharynx is clear.  Eyes:     General: No scleral icterus.    Extraocular Movements: Extraocular movements intact.  Cardiovascular:     Rate and Rhythm: Regular rhythm. Bradycardia present.     Heart sounds: Normal heart sounds. No murmur heard.    No friction rub. No gallop.  Pulmonary:     Effort: Pulmonary effort is normal. No respiratory distress.     Breath sounds: Normal breath sounds. No wheezing, rhonchi or rales.  Abdominal:     General: Bowel sounds are normal. There is no distension.     Palpations: Abdomen is soft.     Tenderness: There is no abdominal tenderness. There is no guarding or rebound.  Musculoskeletal:     Cervical back: Neck supple.     Right lower leg: No edema.     Left lower leg: No edema.  Skin:    General: Skin is warm and dry.     Coloration: Skin is not jaundiced or pale.  Neurological:     General: No focal deficit present.     Mental Status: He is alert and oriented to person, place, and time. Mental status is at baseline.  Psychiatric:        Mood and Affect: Mood normal.        Behavior: Behavior normal.        Thought Content: Thought content normal.        Judgment: Judgment normal.      Assessment:  [name] is a 82 y.o. male  who presents today for Esophagogastroduodenoscopy for Dysphagia, abnormal barium swallow .  Plan:  Esophagogastroduodenoscopy with possible intervention today  Esophagogastroduodenoscopy with possible biopsy, control of bleeding, polypectomy, and interventions as necessary has been discussed with the patient/patient representative. Informed  consent was obtained from the patient/patient representative after explaining the indication, nature, and risks of the procedure including but not limited to death, bleeding, perforation, missed neoplasm/lesions, cardiorespiratory compromise, and reaction to medications. Opportunity for questions was given and appropriate answers were provided. Patient/patient representative has verbalized understanding is amenable to undergoing the procedure.   [name], DO  [facility]  Portions of the record may have been created with voice recognition software. Occasional wrong-word or 'sound-a-like' substitutions may have occurred due to the inherent limitations of voice recognition software.  Read the chart carefully and recognize, using context, where substitutions may have occurred.

## 2023-12-09 ENCOUNTER — Encounter
Admission: RE | Disposition: A | Discharge status: Home / Self Care | Payer: Self-pay | Source: Ambulatory Visit | Attending: Gastroenterology

## 2023-12-09 ENCOUNTER — Encounter: Payer: Self-pay | Admitting: Gastroenterology

## 2023-12-09 ENCOUNTER — Ambulatory Visit: Payer: Medicare Other | Admitting: Anesthesiology

## 2023-12-09 ENCOUNTER — Ambulatory Visit
Admission: RE | Admit: 2023-12-09 | Discharge: 2023-12-09 | Disposition: A | Discharge status: Home / Self Care | Payer: Medicare Other | Source: Ambulatory Visit | Attending: Gastroenterology | Admitting: Gastroenterology

## 2023-12-09 DIAGNOSIS — Z8719 Personal history of other diseases of the digestive system: Secondary | ICD-10-CM | POA: Insufficient documentation

## 2023-12-09 DIAGNOSIS — I10 Essential (primary) hypertension: Secondary | ICD-10-CM | POA: Insufficient documentation

## 2023-12-09 DIAGNOSIS — Z9889 Other specified postprocedural states: Secondary | ICD-10-CM | POA: Insufficient documentation

## 2023-12-09 DIAGNOSIS — Z8673 Personal history of transient ischemic attack (TIA), and cerebral infarction without residual deficits: Secondary | ICD-10-CM | POA: Diagnosis not present

## 2023-12-09 DIAGNOSIS — K222 Esophageal obstruction: Secondary | ICD-10-CM | POA: Insufficient documentation

## 2023-12-09 DIAGNOSIS — G4730 Sleep apnea, unspecified: Secondary | ICD-10-CM | POA: Diagnosis not present

## 2023-12-09 DIAGNOSIS — R1310 Dysphagia, unspecified: Secondary | ICD-10-CM | POA: Insufficient documentation

## 2023-12-09 DIAGNOSIS — K2970 Gastritis, unspecified, without bleeding: Secondary | ICD-10-CM | POA: Diagnosis not present

## 2023-12-09 HISTORY — PX: ESOPHAGOGASTRODUODENOSCOPY (EGD) WITH PROPOFOL: SHX5813

## 2023-12-09 HISTORY — PX: BIOPSY: SHX5522

## 2023-12-09 HISTORY — PX: BALLOON DILATION: SHX5330

## 2023-12-09 SURGERY — ESOPHAGOGASTRODUODENOSCOPY (EGD) WITH PROPOFOL
Anesthesia: General

## 2023-12-09 MED ORDER — PROPOFOL 10 MG/ML IV BOLUS
INTRAVENOUS | Status: DC | PRN
  Administered 2023-12-09: 80 mg via INTRAVENOUS

## 2023-12-09 MED ORDER — SODIUM CHLORIDE 0.9 % IV SOLN: INTRAVENOUS | Status: DC

## 2023-12-09 MED ORDER — PROPOFOL 500 MG/50ML IV EMUL
INTRAVENOUS | Status: DC | PRN
  Administered 2023-12-09: 150 ug/kg/min via INTRAVENOUS
  Administered 2023-12-09: 1 ug/kg/min via INTRAVENOUS

## 2023-12-09 MED ORDER — GLYCOPYRROLATE 0.2 MG/ML IJ SOLN
INTRAMUSCULAR | Status: DC | PRN
Start: 2023-12-09 — End: 2023-12-09
  Administered 2023-12-09: .2 mg via INTRAVENOUS

## 2023-12-09 NOTE — Anesthesia Postprocedure Evaluation (Signed)
Anesthesia Post Note  Patient: Shawn Khan  Procedure(s) Performed: ESOPHAGOGASTRODUODENOSCOPY (EGD) WITH PROPOFOL BIOPSY BALLOON DILATION  Patient location during evaluation: Endoscopy Anesthesia Type: General Level of consciousness: awake and alert Pain management: pain level controlled Vital Signs Assessment: post-procedure vital signs reviewed and stable Respiratory status: spontaneous breathing, nonlabored ventilation, respiratory function stable and patient connected to nasal cannula oxygen Cardiovascular status: blood pressure returned to baseline and stable Postop Assessment: no apparent nausea or vomiting Anesthetic complications: no   No notable events documented.   Last Vitals:  Vitals:   12/09/23 0921 12/09/23 0932  BP: 111/73 (!) 127/92  Pulse: (!) 51 (!) 58  Resp:    Temp:    SpO2: 95% 96%    Last Pain:  Vitals:   12/09/23 0932  TempSrc:   PainSc: 0-No pain                 Cleda Mccreedy Raymonda Pell

## 2023-12-09 NOTE — Anesthesia Preprocedure Evaluation (Addendum)
Anesthesia Evaluation  Patient identified by MRN, date of birth, ID band Patient awake    Reviewed: Allergy & Precautions, NPO status , Patient's Chart, lab work & pertinent test results  History of Anesthesia Complications (+) history of anesthetic complications  Airway Mallampati: III  TM Distance: <3 FB Neck ROM: full    Dental  (+) Chipped   Pulmonary sleep apnea    Pulmonary exam normal        Cardiovascular hypertension, Normal cardiovascular exam     Neuro/Psych CVA  negative psych ROS   GI/Hepatic Neg liver ROS,GERD  ,,  Endo/Other  negative endocrine ROS    Renal/GU negative Renal ROS  negative genitourinary   Musculoskeletal   Abdominal   Peds  Hematology negative hematology ROS (+)   Anesthesia Other Findings Patient reports that they do not think that any food or pills are stuck in their throat at this time.  Past Medical History: No date: Arthritis No date: Chronic hoarseness No date: Complication of anesthesia     Comment:  h/o nasal intubation x 1 hoarseness x 1 week No date: Contusion of chest No date: Cough     Comment:  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 No date: Gastritis No date: GERD (gastroesophageal reflux disease) No date: Hemorrhagic stroke (HCC)     Comment:  more than 5 years ago No date: History of chicken pox No date: Hyperlipidemia     Comment:  did not like crestor  No date: Hypertension No date: Hypoglycemia No date: Hypogonadism in male No date: Insomnia No date: Low testosterone in male No date: Osteoporosis No date: Prediabetes     Comment:  A1C 5.9 06/25/18  No date: Rotator cuff disorder No date: Sleep apnea     Comment:  cpap No date: Stroke [facility]) No date: Vocal cord dysfunction     Comment:  in 2018 damage per pt  No date: Vocal cord nodule No date: Zenker diverticulum     Comment:  1.7 cm   Past Surgical History: No date: APPENDECTOMY     Comment:  1955 No date:  EYE SURGERY     Comment:  b/l cataract No date: FEMUR FRACTURE SURGERY No date: FRACTURE SURGERY 06/13/2021: IR GUIDED DRAIN W CATHETER PLACEMENT 06/13/2021: IR PERC CHOLECYSTOSTOMY 09/17/2017: LARYNGOSCOPY     Comment:  Procedure: LARYNGOSCOPY;  Surgeon: Linus Salmons, MD;              Location: [facility] ORS;  Service: ENT;; No date: PENILE BIOPSY     Comment:  04/2018 ulcer with lichenoid inflammation  No date: rcr; Bilateral No date: SHOULDER SURGERY     Comment:  x3 right and left  No date: THROAT SURGERY     Comment:  vocal cord nodule No date: TONSILLECTOMY     Comment:  1948  BMI    Body Mass Index: 28.51 kg/m      Reproductive/Obstetrics negative OB ROS                             Anesthesia Physical Anesthesia Plan  ASA: 3  Anesthesia Plan: General   Post-op Pain Management:    Induction: Intravenous  PONV Risk Score and Plan: Propofol infusion and TIVA  Airway Management Planned: Natural Airway and Nasal Cannula  Additional Equipment:   Intra-op Plan:   Post-operative Plan:   Informed Consent: I have reviewed the patients History and Physical, chart, labs and discussed the procedure including the  risks, benefits and alternatives for the proposed anesthesia with the patient or authorized representative who has indicated his/her understanding and acceptance.     Dental Advisory Given  Plan Discussed with: Anesthesiologist, CRNA and Surgeon  Anesthesia Plan Comments: (Patient consented for risks of anesthesia including but not limited to:  - adverse reactions to medications - risk of airway placement if required - damage to eyes, teeth, lips or other oral mucosa - nerve damage due to positioning  - sore throat or hoarseness - Damage to heart, brain, nerves, lungs, other parts of body or loss of life  Patient voiced understanding and assent.)       Anesthesia Quick Evaluation

## 2023-12-09 NOTE — Interval H&P Note (Signed)
History and Physical Interval Note: Preprocedure H&P from 12/09/23  was reviewed and there was no interval change after seeing and examining the patient.  Written consent was obtained from the patient after discussion of risks, benefits, and alternatives. Patient has consented to proceed with Esophagogastroduodenoscopy with possible intervention   12/09/2023 8:51 AM  Shawn Khan  has presented today for surgery, with the diagnosis of R13.10 (ICD-10-CM) - Dysphagia, unspecified type R93.3 (ICD-10-CM) - Abnormal barium swallow K22.5 (ICD-10-CM) - Zenker's diverticulum.  The various methods of treatment have been discussed with the patient and family. After consideration of risks, benefits and other options for treatment, the patient has consented to  Procedure(s): ESOPHAGOGASTRODUODENOSCOPY (EGD) WITH PROPOFOL (N/A) as a surgical intervention.  The patient's history has been reviewed, patient examined, no change in status, stable for surgery.  I have reviewed the patient's chart and labs.  Questions were answered to the patient's satisfaction.     [name]

## 2023-12-09 NOTE — Op Note (Signed)
[facility] Patient Name: Shawn Khan Procedure Date: 12/09/2023 8:48 AM MRN: [id number] Account #: [id number] Date of Birth: [birth year] Admit Type: Outpatient Age: 82 Room: [facility] ENDO ROOM 2 Gender: Male Note Status: Finalized Instrument Name: Upper Endoscope [id number] Procedure:             Upper GI endoscopy Indications:           Dysphagia, Abnormal cine-esophagram Providers:             Trenda Moots, DO Referring MD:          Duane Lope. Judithann Sheen, MD (Referring MD) Medicines:             Monitored Anesthesia Care Complications:         No immediate complications. Estimated blood loss:                         Minimal. Procedure:             Pre-Anesthesia Assessment:                        - Prior to the procedure, a History and Physical was                         performed, and patient medications and allergies were                         reviewed. The patient is competent. The risks and                         benefits of the procedure and the sedation options and                         risks were discussed with the patient. All questions                         were answered and informed consent was obtained.                         Patient identification and proposed procedure were                         verified by the physician, the nurse, the anesthetist                         and the technician in the endoscopy suite. Mental                         Status Examination: alert and oriented. Airway                         Examination: normal oropharyngeal airway and neck                         mobility. Respiratory Examination: clear to                         auscultation. CV Examination: RRR, no murmurs, no S3  or S4. Prophylactic Antibiotics: The patient does not                         require prophylactic antibiotics. Prior                         Anticoagulants: The patient has taken no  anticoagulant                         or antiplatelet agents. ASA Grade Assessment: III - A                         patient with severe systemic disease. After reviewing                         the risks and benefits, the patient was deemed in                         satisfactory condition to undergo the procedure. The                         anesthesia plan was to use monitored anesthesia care                         (MAC). Immediately prior to administration of                         medications, the patient was re-assessed for adequacy                         to receive sedatives. The heart rate, respiratory                         rate, oxygen saturations, blood pressure, adequacy of                         pulmonary ventilation, and response to care were                         monitored throughout the procedure. The physical                         status of the patient was re-assessed after the                         procedure.                        After obtaining informed consent, the endoscope was                         passed under direct vision. Throughout the procedure,                         the patient's blood pressure, pulse, and oxygen                         saturations were monitored continuously. The Endoscope  was introduced through the mouth, and advanced to the                         second part of duodenum. The upper GI endoscopy was                         accomplished without difficulty. The patient tolerated                         the procedure well. Findings:      The duodenal bulb, first portion of the duodenum and second portion of       the duodenum were normal. Estimated blood loss: none.      Localized mild inflammation characterized by erythema was found in the       gastric antrum. Biopsies were taken with a cold forceps for Helicobacter       pylori testing. Estimated blood loss was minimal.      The Z-line was regular.  Estimated blood loss: none.      Esophagogastric landmarks were identified: the gastroesophageal junction       was found at 40 cm from the incisors.      One benign-appearing, intrinsic moderate (circumferential scarring or       stenosis; an endoscope may pass) stenosis was found. This stenosis       measured 1.1 cm (inner diameter) x less than one cm (in length). The       stenosis was traversed. A TTS dilator was passed through the scope.       Dilation with a 12-13.5-15 mm balloon dilator was performed to 12 mm,       13.5 mm and 15 mm. The dilation site was examined following endoscope       reinsertion and showed mild mucosal disruption. Superficial mucosal       disruption noted after 15mm TTS balloon. No further dilation performed.       Estimated blood loss was minimal.      The exam of the esophagus was otherwise normal. Impression:            - Normal duodenal bulb, first portion of the duodenum                         and second portion of the duodenum.                        - Gastritis. Biopsied.                        - Z-line regular.                        - Esophagogastric landmarks identified.                        - Benign-appearing esophageal stenosis. Dilated. Recommendation:        - Patient has a contact number available for                         emergencies. The signs and symptoms of potential                         delayed  complications were discussed with the patient.                         Return to normal activities tomorrow. Written                         discharge instructions were provided to the patient.                        - Discharge patient to home.                        - Soft diet.                        - Continue present medications.                        - No ibuprofen, naproxen, or other non-steroidal                         anti-inflammatory drugs for 5 days.                        - Await pathology results.                        -  Repeat upper endoscopy in 3 weeks for retreatment.                        - Return to GI office as previously scheduled.                        - The findings and recommendations were discussed with                         the patient. Procedure Code(s):     --- Professional ---                        [phone], Esophagogastroduodenoscopy, flexible,                         transoral; with transendoscopic balloon dilation of                         esophagus (less than 30 mm diameter)                        [id number], 59, Esophagogastroduodenoscopy, flexible,                         transoral; with biopsy, single or multiple Diagnosis Code(s):     --- Professional ---                        K29.70, Gastritis, unspecified, without bleeding                        K22.2, Esophageal obstruction                        R13.10, Dysphagia, unspecified  R93.3, Abnormal findings on diagnostic imaging of                         other parts of digestive tract CPT copyright 2022 American Medical Association. All rights reserved. The codes documented in this report are preliminary and upon coder review may  be revised to meet current compliance requirements. Attending Participation:      I personally performed the entire procedure. Elfredia Nevins, DO [name] DO, DO 12/09/2023 9:21:32 AM This report has been signed electronically. Number of Addenda: 0 Note Initiated On: 12/09/2023 8:48 AM Estimated Blood Loss:  Estimated blood loss was minimal.      [facility]

## 2023-12-09 NOTE — Transfer of Care (Signed)
Immediate Anesthesia Transfer of Care Note  Patient: Shawn Khan  Procedure(s) Performed: ESOPHAGOGASTRODUODENOSCOPY (EGD) WITH PROPOFOL BIOPSY BALLOON DILATION  Patient Location: PACU  Anesthesia Type:General  Level of Consciousness: awake  Airway & Oxygen Therapy: Patient Spontanous Breathing  Post-op Assessment: Report given to RN and Post -op Vital signs reviewed and stable  Post vital signs: Reviewed and stable All V/S WNL see PACU flow sheet  Last Vitals:  Vitals Value Taken Time  BP    Temp    Pulse 56 12/09/23 0913  Resp    SpO2 96 % 12/09/23 0913  Vitals shown include unfiled device data.  Last Pain:  Vitals:   12/09/23 0746  TempSrc: Temporal         Complications: No notable events documented.

## 2023-12-10 LAB — SURGICAL PATHOLOGY

## 2023-12-12 ENCOUNTER — Ambulatory Visit: Payer: Medicare Other

## 2023-12-12 DIAGNOSIS — M6281 Muscle weakness (generalized): Secondary | ICD-10-CM

## 2023-12-12 NOTE — Therapy (Signed)
OUTPATIENT PHYSICAL THERAPY BALANCE TREATMENT   Patient Name: Shawn Khan MRN: [id number] DOB:1941-09-28, 82 y.o., male Today's Date: 12/12/2023  END OF SESSION:  PT End of Session - 12/12/23 1021     Visit Number 2    Number of Visits 25    Date for PT Re-Evaluation 02/26/24    Authorization Type eval: 12/04/23    PT Start Time 1018    PT Stop Time 1100    PT Time Calculation (min) 42 min    Equipment Utilized During Treatment Gait belt    Activity Tolerance Patient tolerated treatment well    Behavior During Therapy WFL for tasks assessed/performed            Past Medical History:  Diagnosis Date   Arthritis    Chronic hoarseness    Complication of anesthesia    h/o nasal intubation x 1 hoarseness x 1 week   Contusion of chest    Cough    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   Gastritis    GERD (gastroesophageal reflux disease)    Hemorrhagic stroke (HCC)    more than 5 years ago   History of chicken pox    Hyperlipidemia    did not like crestor    Hypertension    Hypoglycemia    Hypogonadism in male    Insomnia    Low testosterone in male    Osteoporosis    Prediabetes    A1C 5.9 06/25/18    Rotator cuff disorder    Sleep apnea    cpap   Stroke [facility])    Vocal cord dysfunction    in 2018 damage per pt    Vocal cord nodule    Zenker diverticulum    1.7 cm    Past Surgical History:  Procedure Laterality Date   APPENDECTOMY     1955   EYE SURGERY     b/l cataract   FEMUR FRACTURE SURGERY     FRACTURE SURGERY     IR GUIDED DRAIN W CATHETER PLACEMENT  06/13/2021   IR PERC CHOLECYSTOSTOMY  06/13/2021   LARYNGOSCOPY  09/17/2017   Procedure: LARYNGOSCOPY;  Surgeon: Linus Salmons, MD;  Location: [facility] ORS;  Service: ENT;;   PENILE BIOPSY     04/2018 ulcer with lichenoid inflammation    rcr Bilateral    SHOULDER SURGERY     x3 right and left    THROAT SURGERY     vocal cord nodule   TONSILLECTOMY     1948   Patient Active Problem List   Diagnosis  Date Noted   Acute cholecystitis 06/12/2021   Aortic atherosclerosis (HCC) 03/02/2019   Bradycardia 12/18/2018   Leg cramps 12/18/2018   Prediabetes 10/03/2018   Barrett's esophagus 10/03/2018   Penile ulcer 09/16/2018   Zenker's diverticulum 09/16/2018   Degenerative joint disease involving multiple joints on both sides of body 04/23/2018   OSA (obstructive sleep apnea) 04/23/2018   Urinary urgency 04/23/2018   Seborrheic keratosis 04/23/2018   Microscopic hematuria 11/20/2017   Acquired phimosis of penis 11/20/2017   Benign essential hypertension 11/20/2017   Benign prostatic hyperplasia with urinary obstruction 11/20/2017   Chronic hoarseness 11/20/2017   Contusion of chest 11/20/2017   Disorder of bone and articular cartilage 11/20/2017   Disorder of rotator cuff 11/20/2017   Dysphagia 11/20/2017   Gastroesophageal reflux disease 11/20/2017   Generalized osteoarthritis 11/20/2017   History of stroke without residual deficits 11/20/2017   Hypoxemia 11/20/2017   Impaired fasting glucose  11/20/2017   Increased frequency of urination 11/20/2017   Insomnia 11/20/2017   Backache 11/20/2017   Mixed hyperlipidemia 11/20/2017   Obstructive sleep apnea syndrome 11/20/2017   Osteoporosis 11/20/2017   Urinary urgency 11/20/2017   Shoulder joint painful on movement 11/20/2017   Seborrheic keratosis 11/20/2017   Rib pain 11/20/2017   Pharyngitis 11/20/2017   Male hypogonadism 11/20/2017    PCP: Marguarite Arbour, MD  REFERRING PROVIDER: Lonell Face, MD   REFERRING DIAG: R53.81 (ICD-10-CM) - Other malaise   RATIONALE FOR EVALUATION AND TREATMENT: Rehabilitation  THERAPY DIAG: Muscle weakness (generalized)  ONSET DATE: 1 year (approximate)  FOLLOW-UP APPT SCHEDULED WITH REFERRING PROVIDER: Yes   FROM INITIAL EVALUATION SUBJECTIVE:                                                                                                                                                                                          SUBJECTIVE STATEMENT:  Progressive weakness  PERTINENT HISTORY:  Pt reports progressive weakness for the last year as well as issues with endurance. No known cause. History of chronic bilateral knee pain and L shoulder pain s/p surgery. He also complains of bilateral wrist pain. History of CVA "30 years ago." He has some residual RUE numbness from the CVA but otherwise no residual deficits. Pt reports that he struggles with his memory. He has been seeing floaters recently and has an appt with an eye doctor today after his PT evaluation.   10/12/2023 MRI Brain without contrast  IMPRESSION:  1. No evidence of acute intracranial abnormality.  2. Chronic hemorrhage or chronic hemorrhagic infarct within the left  subinsular white matter/basal ganglia.  3. Minimal chronic small-vessel ischemic changes elsewhere within  the cerebral white matter.  4. Mild generalized cerebral atrophy.  Pain: Yes, bilateral knee pain Numbness/Tingling: Yes, RUE Focal Weakness: No Recent changes in overall health/medication: No Prior history of physical therapy for balance:  No Dominant hand: right Imaging: Yes  Red flags: Negative for bowel/bladder changes, saddle paresthesia, personal history of cancer, h/o spinal tumors, h/o compression fx, h/o abdominal aneurysm, abdominal pain, chills/fever, night sweats, nausea, vomiting, unrelenting pain, first onset of insidious LBP <20 y/o  PRECAUTIONS: Fall  WEIGHT BEARING RESTRICTIONS: No  FALLS: Has patient fallen in last 6 months? No  Living Environment Lives with: lives with their spouse, daughter and granddaughter next door. Grandson lives 10 minutes away. Lives in: House/apartment, ramp as well as 9 steps with bilateral wide rails Has following equipment at home: Single point cane, Walker - 2 wheeled, Environmental consultant - 4 wheeled, and power. Walk in shower with seat and grab bars  Prior  level of function:  Independent  Occupational demands: Retired Curator for [employer]: [place], playing games, helping daughter with her Armed forces technical officer business, caring for his chickens  Patient Goals: "Get back to gardening" Pt would like to improve his endurance   OBJECTIVE:   Patient Surveys  FOTO: 58, predicted improvement to 63 ABC: To be completed (given to pt to take home)  Cognition Patient is oriented to person, place, and time.  Recent memory is intact.  Remote memory is intact.  Attention span and concentration are intact.  Expressive speech is intact.  Patient's fund of knowledge is within normal limits for educational level.    Gross Musculoskeletal Assessment Tremor: None Bulk: Normal Tone: Normal  Posture: No gross abnormalities noted in standing or seated posture  AROM Deferred  LE MMT: MMT (out of 5) Right  Left   Hip flexion 4 4  Hip extension    Hip abduction (seated) 4 4  Hip adduction (seated) 4 4  Hip internal rotation    Hip external rotation    Knee flexion (seated) 5 5  Knee extension 5 5  Ankle dorsiflexion 5 5  Ankle plantarflexion    Ankle inversion    Ankle eversion    (* = pain; Blank rows = not tested)  UE MMT grossly WNL and symmetrical  Sensation Deferred  Reflexes Deferred  Cranial Nerves Deferred  Coordination/Cerebellar Deferred  Bed mobility: Deferred  Transfers: Assistive device utilized: None  Sit to stand: Complete Independence Stand to sit: Complete Independence Chair to chair: Complete Independence Deferred  Curb:  Deferred  Stairs: Level of Assistance: Modified independence Stair Negotiation Technique: Alternating Pattern  with Bilateral Rails Number of Stairs: 4  Height of Stairs: 6"  Comments: No safety concerns noted  Gait: Gait pattern: WFL Distance walked: 100' Assistive device utilized: None Level of assistance: Complete Independence Comments: No gross deficits  identified  Functional Outcome Measures  Results Comments  BERG 53/56 Mild deficits  DGI 23/24 WNL  FGA    TUG 10.7 seconds WNL  5TSTS 26.2 seconds Bilateral knee pain  6 Minute Walk Test    10 Meter Gait Speed Self-selected: 9.9 s = 1.01 m/s; Fastest: 7.9 s = 1.27 m/s WNL  (Blank rows = not tested)   TODAY'S TREATMENT   SUBJECTIVE: Pt reports that he is doing well today. No changes since the initial evaluation. He saw the eye doctor who thought his vision symptoms were related to high blood pressure. Denies pain upon arrival today. No specific questions or concerns.    PAIN: Denies   Ther-ex   Neuromuscular Re-education  NuStep L2 x 6 minutes for BLE strengthening and warm-up during interval history (2 minutes unbilled);   Performed MiniBESTest: 17/28 Tandem static balance without UE support x 30s on each side; Tandem balance without UE support and vertical/horizontal head turns x 30s each on each side; Tandem gait in // bars without UE support x multiple lengths; Heel/toe raises in // bars without UE support 3s hold x 10 each;  HEP issued and reviewed with patient;   PATIENT EDUCATION:  Education details: Outcome measures and HEP Person educated: Patient Education method: Explanation, Demonstration, Verbal cues, and Handouts Education comprehension: verbalized understanding   HOME EXERCISE PROGRAM:  Access Code: [email or web address] URL: [email or web address] Date: 12/12/2023 Prepared by: Ria Comment  Exercises - Standing Tandem Balance with Counter Support  - 1 x daily - 7 x weekly - 3 reps - 30s hold - Standing Tandem Balance  with Counter Support (Mirrored)  - 1 x daily - 7 x weekly - 3 reps - 30s hold - Tandem Stance with Head Rotation  - 1 x daily - 7 x weekly - 3 reps - 30s hold - Tandem Stance with Head Rotation (Mirrored)  - 1 x daily - 7 x weekly - 3 reps - 30s hold - Tandem Stance with Head Nods   - 1 x daily - 7 x weekly - 3 reps - 30s hold -  Tandem Stance with Head Nods  (Mirrored)  - 1 x daily - 7 x weekly - 3 reps - 30s hold - Heel Raise  - 1-2 x daily - 7 x weekly - 2 sets - 10 reps - 3s hold   ASSESSMENT:  CLINICAL IMPRESSION: Performed MiniBESTest with patient who scored 17/28 indicating balance impairments. Initiated balance exercises and issued HEP. Pt encouraged to follow-up as scheduled. Plan to progress balance and strength exercises at future sessions. He will benefit from PT services to address deficits in strength, balance, and mobility in order to return to full function at home and decrease his risk for falls.    OBJECTIVE IMPAIRMENTS: decreased balance, decreased endurance, decreased strength, and pain.   ACTIVITY LIMITATIONS: standing  PARTICIPATION LIMITATIONS: cleaning, shopping, and community activity  PERSONAL FACTORS: Age, Time since onset of injury/illness/exacerbation, and 3+ comorbidities: OA, OSA, and memory impairments  are also affecting patient's functional outcome.   REHAB POTENTIAL: Good  CLINICAL DECISION MAKING: Evolving/moderate complexity  EVALUATION COMPLEXITY: Moderate   GOALS: Goals reviewed with patient? No  SHORT TERM GOALS: Target date: 01/15/2024  Pt will be independent with HEP in order to improve strength and balance in order to decrease fall risk and improve function at home. Baseline:  Goal status: INITIAL   LONG TERM GOALS: Target date: 02/26/2024  Pt will increase FOTO to at least 63 to demonstrate significant improvement in function at home related to balance  Baseline: 58 Goal status: INITIAL  2.  Pt will improve single leg balance to >10s on both legs in order to demonstrate clinically significant improvement in balance.   Baseline: 5-10s on each side; Goal status: INITIAL  3.  Pt will improve ABC by at least 13% in order to demonstrate clinically significant improvement in balance confidence.      Baseline: 12/12/23: 68.1% Goal status: DISCONTINUED;  4. Pt  will decrease 5TSTS by at least 3 seconds in order to demonstrate clinically significant improvement in LE strength      Baseline: 26.2s Goal status: INITIAL  5. Pt will increase MiniBESTest by at least 4 points in order to demonstrate clinically significant improvement in balance.     Baseline: 12/12/23: 17/28; Goal status: INITIAL   PLAN: PT FREQUENCY: 1-2x/week  PT DURATION: 12 weeks  PLANNED INTERVENTIONS: Therapeutic exercises, Therapeutic activity, Neuromuscular re-education, Balance training, Gait training, Patient/Family education, Self Care, Joint mobilization, Joint manipulation, Vestibular training, Canalith repositioning, Orthotic/Fit training, DME instructions, Dry Needling, Electrical stimulation, Spinal manipulation, Spinal mobilization, Cryotherapy, Moist heat, Taping, Traction, Ultrasound, Ionotophoresis 4mg /ml Dexamethasone, Manual therapy, and Re-evaluation.  PLAN FOR NEXT SESSION: progress balance and strengthening, modify/review HEP as needed;   Sharalyn Ink Irena Gaydos PT, DPT, GCS  Verbon Giangregorio 12/12/2023, 11:00 AM

## 2023-12-12 NOTE — Therapy (Signed)
OUTPATIENT PHYSICAL THERAPY BALANCE TREATMENT   Patient Name: Shawn Khan MRN: [id number] DOB:1941/03/05, 82 y.o., male Today's Date: 12/16/2023  END OF SESSION:  PT End of Session - 12/16/23 1318     Visit Number 3    Number of Visits 25    Date for PT Re-Evaluation 02/26/24    Authorization Type eval: 12/04/23    PT Start Time 1315    PT Stop Time 1400    PT Time Calculation (min) 45 min    Equipment Utilized During Treatment Gait belt    Activity Tolerance Patient tolerated treatment well    Behavior During Therapy WFL for tasks assessed/performed            Past Medical History:  Diagnosis Date   Arthritis    Chronic hoarseness    Complication of anesthesia    h/o nasal intubation x 1 hoarseness x 1 week   Contusion of chest    Cough    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   Gastritis    GERD (gastroesophageal reflux disease)    Hemorrhagic stroke (HCC)    more than 5 years ago   History of chicken pox    Hyperlipidemia    did not like crestor    Hypertension    Hypoglycemia    Hypogonadism in male    Insomnia    Low testosterone in male    Osteoporosis    Prediabetes    A1C 5.9 06/25/18    Rotator cuff disorder    Sleep apnea    cpap   Stroke (HCC)    Vocal cord dysfunction    in 2018 damage per pt    Vocal cord nodule    Zenker diverticulum    1.7 cm    Past Surgical History:  Procedure Laterality Date   APPENDECTOMY     1955   BALLOON DILATION  12/09/2023   Procedure: BALLOON DILATION;  Surgeon: [name], DO;  Location: [facility] ENDOSCOPY;  Service: Gastroenterology;;   BIOPSY  12/09/2023   Procedure: BIOPSY;  Surgeon: [name], DO;  Location: [facility] ENDOSCOPY;  Service: Gastroenterology;;   ESOPHAGOGASTRODUODENOSCOPY (EGD) WITH PROPOFOL N/A 12/09/2023   Procedure: ESOPHAGOGASTRODUODENOSCOPY (EGD) WITH PROPOFOL;  Surgeon: [name], DO;  Location: [facility] ENDOSCOPY;  Service: Gastroenterology;  Laterality: N/A;   EYE  SURGERY     b/l cataract   FEMUR FRACTURE SURGERY     FRACTURE SURGERY     IR GUIDED DRAIN W CATHETER PLACEMENT  06/13/2021   IR PERC CHOLECYSTOSTOMY  06/13/2021   LARYNGOSCOPY  09/17/2017   Procedure: LARYNGOSCOPY;  Surgeon: Linus Salmons, MD;  Location: [facility] ORS;  Service: ENT;;   PENILE BIOPSY     04/2018 ulcer with lichenoid inflammation    rcr Bilateral    SHOULDER SURGERY     x3 right and left    THROAT SURGERY     vocal cord nodule   TONSILLECTOMY     1948   Patient Active Problem List   Diagnosis Date Noted   Acute cholecystitis 06/12/2021   Aortic atherosclerosis (HCC) 03/02/2019   Bradycardia 12/18/2018   Leg cramps 12/18/2018   Prediabetes 10/03/2018   Barrett's esophagus 10/03/2018   Penile ulcer 09/16/2018   Zenker's diverticulum 09/16/2018   Degenerative joint disease involving multiple joints on both sides of body 04/23/2018   OSA (obstructive sleep apnea) 04/23/2018   Urinary urgency 04/23/2018   Seborrheic keratosis 04/23/2018   Microscopic hematuria 11/20/2017   Acquired phimosis of  penis 11/20/2017   Benign essential hypertension 11/20/2017   Benign prostatic hyperplasia with urinary obstruction 11/20/2017   Chronic hoarseness 11/20/2017   Contusion of chest 11/20/2017   Disorder of bone and articular cartilage 11/20/2017   Disorder of rotator cuff 11/20/2017   Dysphagia 11/20/2017   Gastroesophageal reflux disease 11/20/2017   Generalized osteoarthritis 11/20/2017   History of stroke without residual deficits 11/20/2017   Hypoxemia 11/20/2017   Impaired fasting glucose 11/20/2017   Increased frequency of urination 11/20/2017   Insomnia 11/20/2017   Backache 11/20/2017   Mixed hyperlipidemia 11/20/2017   Obstructive sleep apnea syndrome 11/20/2017   Osteoporosis 11/20/2017   Urinary urgency 11/20/2017   Shoulder joint painful on movement 11/20/2017   Seborrheic keratosis 11/20/2017   Rib pain 11/20/2017   Pharyngitis 11/20/2017   Male  hypogonadism 11/20/2017    PCP: Marguarite Arbour, MD  REFERRING PROVIDER: Marguarite Arbour, MD   REFERRING DIAG: R53.81 (ICD-10-CM) - Other malaise   RATIONALE FOR EVALUATION AND TREATMENT: Rehabilitation  THERAPY DIAG: Muscle weakness (generalized)  ONSET DATE: 1 year (approximate)  FOLLOW-UP APPT SCHEDULED WITH REFERRING PROVIDER: Yes   FROM INITIAL EVALUATION SUBJECTIVE:                                                                                                                                                                                         SUBJECTIVE STATEMENT:  Progressive weakness  PERTINENT HISTORY:  Pt reports progressive weakness for the last year as well as issues with endurance. No known cause. History of chronic bilateral knee pain and L shoulder pain s/p surgery. He also complains of bilateral wrist pain. History of CVA "30 years ago." He has some residual RUE numbness from the CVA but otherwise no residual deficits. Pt reports that he struggles with his memory. He has been seeing floaters recently and has an appt with an eye doctor today after his PT evaluation.   10/12/2023 MRI Brain without contrast  IMPRESSION:  1. No evidence of acute intracranial abnormality.  2. Chronic hemorrhage or chronic hemorrhagic infarct within the left  subinsular white matter/basal ganglia.  3. Minimal chronic small-vessel ischemic changes elsewhere within  the cerebral white matter.  4. Mild generalized cerebral atrophy.  Pain: Yes, bilateral knee pain Numbness/Tingling: Yes, RUE Focal Weakness: No Recent changes in overall health/medication: No Prior history of physical therapy for balance:  No Dominant hand: right Imaging: Yes  Red flags: Negative for bowel/bladder changes, saddle paresthesia, personal history of cancer, h/o spinal tumors, h/o compression fx, h/o abdominal aneurysm, abdominal pain, chills/fever, night sweats, nausea, vomiting, unrelenting pain,  first onset of insidious LBP <19 y/o  PRECAUTIONS: Fall  WEIGHT BEARING RESTRICTIONS: No  FALLS: Has patient fallen in last 6 months? No  Living Environment Lives with: lives with their spouse, daughter and granddaughter next door. Grandson lives 10 minutes away. Lives in: House/apartment, ramp as well as 9 steps with bilateral wide rails Has following equipment at home: Single point cane, Walker - 2 wheeled, Environmental consultant - 4 wheeled, and power. Walk in shower with seat and grab bars  Prior level of function: Independent  Occupational demands: Retired Curator for [employer]: [place], playing games, helping daughter with her Armed forces technical officer business, caring for his chickens  Patient Goals: "Get back to gardening" Pt would like to improve his endurance   OBJECTIVE:   Patient Surveys  FOTO: 58, predicted improvement to 63 ABC: To be completed (given to pt to take home)  Cognition Patient is oriented to person, place, and time.  Recent memory is intact.  Remote memory is intact.  Attention span and concentration are intact.  Expressive speech is intact.  Patient's fund of knowledge is within normal limits for educational level.    Gross Musculoskeletal Assessment Tremor: None Bulk: Normal Tone: Normal  Posture: No gross abnormalities noted in standing or seated posture  AROM Deferred  LE MMT: MMT (out of 5) Right  Left   Hip flexion 4 4  Hip extension    Hip abduction (seated) 4 4  Hip adduction (seated) 4 4  Hip internal rotation    Hip external rotation    Knee flexion (seated) 5 5  Knee extension 5 5  Ankle dorsiflexion 5 5  Ankle plantarflexion    Ankle inversion    Ankle eversion    (* = pain; Blank rows = not tested)  UE MMT grossly WNL and symmetrical  Sensation Deferred  Reflexes Deferred  Cranial Nerves Deferred  Coordination/Cerebellar Deferred  Bed mobility: Deferred  Transfers: Assistive device utilized: None   Sit to stand: Complete Independence Stand to sit: Complete Independence Chair to chair: Complete Independence Deferred  Curb:  Deferred  Stairs: Level of Assistance: Modified independence Stair Negotiation Technique: Alternating Pattern  with Bilateral Rails Number of Stairs: 4  Height of Stairs: 6"  Comments: No safety concerns noted  Gait: Gait pattern: WFL Distance walked: 100' Assistive device utilized: None Level of assistance: Complete Independence Comments: No gross deficits identified  Functional Outcome Measures  Results Comments  BERG 53/56 Mild deficits  DGI 23/24 WNL  FGA    TUG 10.7 seconds WNL  5TSTS 26.2 seconds Bilateral knee pain  6 Minute Walk Test    10 Meter Gait Speed Self-selected: 9.9 s = 1.01 m/s; Fastest: 7.9 s = 1.27 m/s WNL  (Blank rows = not tested)   TODAY'S TREATMENT    SUBJECTIVE: Pt reports that he is doing alright today. He punctured his hand on Saturday and tried to go to the [facility] yesterday for an update on his tetanus shot but due to a 1.5 hour weight he left without being seen. He is planning to go to his PCP office after he leaves therapy today. No specific questions upon arrival today.   PAIN: Denies   Neuromuscular Re-education  Alternating 12" step taps without UE support x 15 BLE; Single leg balance practice holding blue tband on tension x approximately 30-45s on each leg; Airex feet together (FT) eyes open/closed x 30s each; Airex FT eyes open horizontal and vertical head turns x 30s each; Airex FT ball passes around body to  therapist with return pass on opposite side, therapist varying height of return pass x multiple bouts toward each side; Rockerboard balance in A/P and R/L orientations eyes open x 60s each direction; Rockerboard eyes open weight shifts A/P and R/L orientations x 60s each; Rockerboard eyes open horizontal and vertical head turns A/P and R/L orientations x 60s each;   Ther-ex  NuStep  L2-4 x 6 minutes for BLE strengthening and warm-up during interval history (2 minutes unbilled);  Total Gym (TG) Level 22 (L22) double leg squats 2 x 15 (second set performed holding 8# dumbbell); TG L22 double leg heel raises 2 x 20;    Not performed: Tandem static balance without UE support x 30s on each side; Tandem balance without UE support and vertical/horizontal head turns x 30s each on each side; Tandem gait in // bars without UE support x multiple lengths; Heel/toe raises in // bars without UE support 3s hold x 10 each;    PATIENT EDUCATION:  Education details: Outcome measures and HEP Person educated: Patient Education method: Explanation, Demonstration, Verbal cues, and Handouts Education comprehension: verbalized understanding   HOME EXERCISE PROGRAM:  Access Code: [email or web address] URL: [email or web address] Date: 12/12/2023 Prepared by: Ria Comment  Exercises - Standing Tandem Balance with Counter Support  - 1 x daily - 7 x weekly - 3 reps - 30s hold - Standing Tandem Balance with Counter Support (Mirrored)  - 1 x daily - 7 x weekly - 3 reps - 30s hold - Tandem Stance with Head Rotation  - 1 x daily - 7 x weekly - 3 reps - 30s hold - Tandem Stance with Head Rotation (Mirrored)  - 1 x daily - 7 x weekly - 3 reps - 30s hold - Tandem Stance with Head Nods   - 1 x daily - 7 x weekly - 3 reps - 30s hold - Tandem Stance with Head Nods  (Mirrored)  - 1 x daily - 7 x weekly - 3 reps - 30s hold - Heel Raise  - 1-2 x daily - 7 x weekly - 2 sets - 10 reps - 3s hold   ASSESSMENT:  CLINICAL IMPRESSION: Progressed balance and strengthening exercises during session today. No HEP modifications. He requires intermittent seated rest breaks throughout session today. Pt encouraged to follow-up as scheduled. Plan to progress balance and strength exercises at future sessions. He will benefit from PT services to address deficits in strength, balance, and mobility in order to  return to full function at home and decrease his risk for falls.    OBJECTIVE IMPAIRMENTS: decreased balance, decreased endurance, decreased strength, and pain.   ACTIVITY LIMITATIONS: standing  PARTICIPATION LIMITATIONS: cleaning, shopping, and community activity  PERSONAL FACTORS: Age, Time since onset of injury/illness/exacerbation, and 3+ comorbidities: OA, OSA, and memory impairments  are also affecting patient's functional outcome.   REHAB POTENTIAL: Good  CLINICAL DECISION MAKING: Evolving/moderate complexity  EVALUATION COMPLEXITY: Moderate   GOALS: Goals reviewed with patient? No  SHORT TERM GOALS: Target date: 01/15/2024  Pt will be independent with HEP in order to improve strength and balance in order to decrease fall risk and improve function at home. Baseline:  Goal status: INITIAL   LONG TERM GOALS: Target date: 02/26/2024  Pt will increase FOTO to at least 63 to demonstrate significant improvement in function at home related to balance  Baseline: 58 Goal status: INITIAL  2.  Pt will improve single leg balance to >10s on both legs in order to demonstrate clinically significant improvement  in balance.   Baseline: 5-10s on each side; Goal status: INITIAL  3.  Pt will improve ABC by at least 13% in order to demonstrate clinically significant improvement in balance confidence.      Baseline: 12/12/23: 68.1% Goal status: DISCONTINUED;  4. Pt will decrease 5TSTS by at least 3 seconds in order to demonstrate clinically significant improvement in LE strength      Baseline: 26.2s Goal status: INITIAL  5. Pt will increase MiniBESTest by at least 4 points in order to demonstrate clinically significant improvement in balance.     Baseline: 12/12/23: 17/28; Goal status: INITIAL   PLAN: PT FREQUENCY: 1-2x/week  PT DURATION: 12 weeks  PLANNED INTERVENTIONS: Therapeutic exercises, Therapeutic activity, Neuromuscular re-education, Balance training, Gait training,  Patient/Family education, Self Care, Joint mobilization, Joint manipulation, Vestibular training, Canalith repositioning, Orthotic/Fit training, DME instructions, Dry Needling, Electrical stimulation, Spinal manipulation, Spinal mobilization, Cryotherapy, Moist heat, Taping, Traction, Ultrasound, Ionotophoresis 4mg /ml Dexamethasone, Manual therapy, and Re-evaluation.  PLAN FOR NEXT SESSION: progress balance and strengthening, modify/review HEP as needed;   Sharalyn Ink Jaquasia Doscher PT, DPT, GCS  Zackary Mckeone 12/16/2023, 2:17 PM

## 2023-12-16 ENCOUNTER — Ambulatory Visit: Payer: Medicare Other

## 2023-12-16 DIAGNOSIS — M6281 Muscle weakness (generalized): Secondary | ICD-10-CM | POA: Diagnosis not present

## 2023-12-19 ENCOUNTER — Ambulatory Visit: Payer: Medicare Other

## 2023-12-19 DIAGNOSIS — M6281 Muscle weakness (generalized): Secondary | ICD-10-CM

## 2023-12-19 NOTE — Addendum Note (Signed)
 Addendum  created 12/19/23 1021 by Stormy Fabian, CRNA   Intraprocedure Meds edited

## 2023-12-20 NOTE — Therapy (Signed)
 OUTPATIENT PHYSICAL THERAPY BALANCE TREATMENT   Patient Name: Shawn Khan MRN: [id number] DOB:1941/10/10, 83 y.o., male Today's Date: 12/23/2023  END OF SESSION:  PT End of Session - 12/23/23 1314     Visit Number 4    Number of Visits 25    Date for PT Re-Evaluation 02/26/24    Authorization Type eval: 12/04/23    PT Start Time 1315    PT Stop Time 1400    PT Time Calculation (min) 45 min    Equipment Utilized During Treatment Gait belt    Activity Tolerance Patient tolerated treatment well    Behavior During Therapy WFL for tasks assessed/performed            Past Medical History:  Diagnosis Date   Arthritis    Chronic hoarseness    Complication of anesthesia    h/o nasal intubation x 1 hoarseness x 1 week   Contusion of chest    Cough    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   Gastritis    GERD (gastroesophageal reflux disease)    Hemorrhagic stroke (HCC)    more than 5 years ago   History of chicken pox    Hyperlipidemia    did not like crestor    Hypertension    Hypoglycemia    Hypogonadism in male    Insomnia    Low testosterone  in male    Osteoporosis    Prediabetes    A1C 5.9 06/25/18    Rotator cuff disorder    Sleep apnea    cpap   Stroke (HCC)    Vocal cord dysfunction    in 2018 damage per pt    Vocal cord nodule    Zenker diverticulum    1.7 cm    Past Surgical History:  Procedure Laterality Date   APPENDECTOMY     1955   BALLOON DILATION  12/09/2023   Procedure: BALLOON DILATION;  Surgeon: Onita Elspeth Sharper, DO;  Location: [facility] ENDOSCOPY;  Service: Gastroenterology;;   BIOPSY  12/09/2023   Procedure: BIOPSY;  Surgeon: Onita Elspeth Sharper, DO;  Location: [facility] ENDOSCOPY;  Service: Gastroenterology;;   ESOPHAGOGASTRODUODENOSCOPY (EGD) WITH PROPOFOL  N/A 12/09/2023   Procedure: ESOPHAGOGASTRODUODENOSCOPY (EGD) WITH PROPOFOL ;  Surgeon: Onita Elspeth Sharper, DO;  Location: [facility] ENDOSCOPY;  Service: Gastroenterology;  Laterality: N/A;   EYE  SURGERY     b/l cataract   FEMUR FRACTURE SURGERY     FRACTURE SURGERY     IR GUIDED DRAIN W CATHETER PLACEMENT  06/13/2021   IR PERC CHOLECYSTOSTOMY  06/13/2021   LARYNGOSCOPY  09/17/2017   Procedure: LARYNGOSCOPY;  Surgeon: Herminio Miu, MD;  Location: [facility] ORS;  Service: ENT;;   PENILE BIOPSY     04/2018 ulcer with lichenoid inflammation    rcr Bilateral    SHOULDER SURGERY     x3 right and left    THROAT SURGERY     vocal cord nodule   TONSILLECTOMY     1948   Patient Active Problem List   Diagnosis Date Noted   Acute cholecystitis 06/12/2021   Aortic atherosclerosis (HCC) 03/02/2019   Bradycardia 12/18/2018   Leg cramps 12/18/2018   Prediabetes 10/03/2018   Barrett's esophagus 10/03/2018   Penile ulcer 09/16/2018   Zenker's diverticulum 09/16/2018   Degenerative joint disease involving multiple joints on both sides of body 04/23/2018   OSA (obstructive sleep apnea) 04/23/2018   Urinary urgency 04/23/2018   Seborrheic keratosis 04/23/2018   Microscopic hematuria 11/20/2017   Acquired phimosis of  penis 11/20/2017   Benign essential hypertension 11/20/2017   Benign prostatic hyperplasia with urinary obstruction 11/20/2017   Chronic hoarseness 11/20/2017   Contusion of chest 11/20/2017   Disorder of bone and articular cartilage 11/20/2017   Disorder of rotator cuff 11/20/2017   Dysphagia 11/20/2017   Gastroesophageal reflux disease 11/20/2017   Generalized osteoarthritis 11/20/2017   History of stroke without residual deficits 11/20/2017   Hypoxemia 11/20/2017   Impaired fasting glucose 11/20/2017   Increased frequency of urination 11/20/2017   Insomnia 11/20/2017   Backache 11/20/2017   Mixed hyperlipidemia 11/20/2017   Obstructive sleep apnea syndrome 11/20/2017   Osteoporosis 11/20/2017   Urinary urgency 11/20/2017   Shoulder joint painful on movement 11/20/2017   Seborrheic keratosis 11/20/2017   Rib pain 11/20/2017   Pharyngitis 11/20/2017   Male  hypogonadism 11/20/2017    PCP: Auston Reyes BIRCH, MD  REFERRING PROVIDER: Maree Jannett POUR, MD   REFERRING DIAG: R53.81 (ICD-10-CM) - Other malaise   RATIONALE FOR EVALUATION AND TREATMENT: Rehabilitation  THERAPY DIAG: Muscle weakness (generalized)  ONSET DATE: 1 year (approximate)  FOLLOW-UP APPT SCHEDULED WITH REFERRING PROVIDER: Yes   FROM INITIAL EVALUATION SUBJECTIVE:                                                                                                                                                                                         SUBJECTIVE STATEMENT:  Progressive weakness  PERTINENT HISTORY:  Pt reports progressive weakness for the last year as well as issues with endurance. No known cause. History of chronic bilateral knee pain and L shoulder pain s/p surgery. He also complains of bilateral wrist pain. History of CVA 30 years ago. He has some residual RUE numbness from the CVA but otherwise no residual deficits. Pt reports that he struggles with his memory. He has been seeing floaters recently and has an appt with an eye doctor today after his PT evaluation.   10/12/2023 MRI Brain without contrast  IMPRESSION:  1. No evidence of acute intracranial abnormality.  2. Chronic hemorrhage or chronic hemorrhagic infarct within the left  subinsular white matter/basal ganglia.  3. Minimal chronic small-vessel ischemic changes elsewhere within  the cerebral white matter.  4. Mild generalized cerebral atrophy.  Pain: Yes, bilateral knee pain Numbness/Tingling: Yes, RUE Focal Weakness: No Recent changes in overall health/medication: No Prior history of physical therapy for balance:  No Dominant hand: right Imaging: Yes  Red flags: Negative for bowel/bladder changes, saddle paresthesia, personal history of cancer, h/o spinal tumors, h/o compression fx, h/o abdominal aneurysm, abdominal pain, chills/fever, night sweats, nausea, vomiting, unrelenting pain, first  onset of insidious LBP <19 y/o  PRECAUTIONS: Fall  WEIGHT BEARING RESTRICTIONS: No  FALLS: Has patient fallen in last 6 months? No  Living Environment Lives with: lives with their spouse, daughter and granddaughter next door. Grandson lives 10 minutes away. Lives in: House/apartment, ramp as well as 9 steps with bilateral wide rails Has following equipment at home: Single point cane, Walker - 2 wheeled, Environmental Consultant - 4 wheeled, and power. Walk in shower with seat and grab bars  Prior level of function: Independent  Occupational demands: Retired curator for [employer]: [place], playing games, helping daughter with her armed forces technical officer business, caring for his chickens  Patient Goals: Get back to gardening Pt would like to improve his endurance   OBJECTIVE:   Patient Surveys  FOTO: 58, predicted improvement to 73 ABC: To be completed (given to pt to take home)  Cognition Patient is oriented to person, place, and time.  Recent memory is intact.  Remote memory is intact.  Attention span and concentration are intact.  Expressive speech is intact.  Patient's fund of knowledge is within normal limits for educational level.    Gross Musculoskeletal Assessment Tremor: None Bulk: Normal Tone: Normal  Posture: No gross abnormalities noted in standing or seated posture  AROM Deferred  LE MMT: MMT (out of 5) Right  Left   Hip flexion 4 4  Hip extension    Hip abduction (seated) 4 4  Hip adduction (seated) 4 4  Hip internal rotation    Hip external rotation    Knee flexion (seated) 5 5  Knee extension 5 5  Ankle dorsiflexion 5 5  Ankle plantarflexion    Ankle inversion    Ankle eversion    (* = pain; Blank rows = not tested)  UE MMT grossly WNL and symmetrical  Sensation Deferred  Reflexes Deferred  Cranial Nerves Deferred  Coordination/Cerebellar Deferred  Bed mobility: Deferred  Transfers: Assistive device utilized: None  Sit to  stand: Complete Independence Stand to sit: Complete Independence Chair to chair: Complete Independence Deferred  Curb:  Deferred  Stairs: Level of Assistance: Modified independence Stair Negotiation Technique: Alternating Pattern  with Bilateral Rails Number of Stairs: 4  Height of Stairs: 6  Comments: No safety concerns noted  Gait: Gait pattern: WFL Distance walked: 100' Assistive device utilized: None Level of assistance: Complete Independence Comments: No gross deficits identified  Functional Outcome Measures  Results Comments  BERG 53/56 Mild deficits  DGI 23/24 WNL  FGA    TUG 10.7 seconds WNL  5TSTS 26.2 seconds Bilateral knee pain  6 Minute Walk Test    10 Meter Gait Speed Self-selected: 9.9 s = 1.01 m/s; Fastest: 7.9 s = 1.27 m/s WNL  (Blank rows = not tested)   TODAY'S TREATMENT    SUBJECTIVE: Pt reports that he is doing alright today. He got his updated tetanus shot at his PCP office. He denies any further issues with the puncture wounds in his hands. Continues with chronic R CMC joint pain. No specific questions upon arrival today.   PAIN: Chronic R CMC joint pain;   Neuromuscular Re-education  Airex alternating 12 step taps without UE support x 15 BLE; Airex staggered balance with front foot on 6 step alternating forward LE x 30s each; Airex staggered balance with front foot on 12 step alternating forward LE x 30s each; Airex feet together horizontal and vertical head turns x 30s each; Airex feet together eyes closed x 30s each; Rockerboard balance in A/P and R/L orientations eyes open  x 30s each direction; Rockerboard eyes open weight shifts A/P and R/L orientations x 30s each; Rockerboard eyes open horizontal and vertical head turns A/P and R/L orientations x 30s each;   Ther-ex  NuStep L2-4 x 7 minutes for BLE strengthening and warm-up during interval history (2 minutes unbilled);  Forward 6 step-ups without UE support x 10 BLE; Nautilus  resisted 50# forward, backward, R lateral, and L lateral x 3 each direction; Sit to stand without UE support holding 8# med ball 2 x 10;   Not performed: Tandem static balance without UE support x 30s on each side; Tandem balance without UE support and vertical/horizontal head turns x 30s each on each side; Tandem gait in // bars without UE support x multiple lengths; Heel/toe raises in // bars without UE support 3s hold x 10 each;  Single leg balance practice holding blue tband on tension x approximately 30-45s on each leg; Airex FT ball passes around body to therapist with return pass on opposite side, therapist varying height of return pass x multiple bouts toward each side; Total Gym (TG) Level 22 (L22) double leg squats 2 x 15 (second set performed holding 8# dumbbell); TG L22 double leg heel raises 2 x 20;    PATIENT EDUCATION:  Education details: Outcome measures and HEP Person educated: Patient Education method: Explanation, Demonstration, Verbal cues, and Handouts Education comprehension: verbalized understanding   HOME EXERCISE PROGRAM:  Access Code: [email or web address] URL: [email or web address] Date: 12/12/2023 Prepared by: Selinda Eck  Exercises - Standing Tandem Balance with Counter Support  - 1 x daily - 7 x weekly - 3 reps - 30s hold - Standing Tandem Balance with Counter Support (Mirrored)  - 1 x daily - 7 x weekly - 3 reps - 30s hold - Tandem Stance with Head Rotation  - 1 x daily - 7 x weekly - 3 reps - 30s hold - Tandem Stance with Head Rotation (Mirrored)  - 1 x daily - 7 x weekly - 3 reps - 30s hold - Tandem Stance with Head Nods   - 1 x daily - 7 x weekly - 3 reps - 30s hold - Tandem Stance with Head Nods  (Mirrored)  - 1 x daily - 7 x weekly - 3 reps - 30s hold - Heel Raise  - 1-2 x daily - 7 x weekly - 2 sets - 10 reps - 3s hold   ASSESSMENT:  CLINICAL IMPRESSION: Progressed balance and strengthening exercises during session today. No HEP  modifications. He requires intermittent seated rest breaks throughout session today but overall endurance is I'm proved. Pt encouraged to follow-up as scheduled. Plan to progress balance and strength exercises at future sessions. He will benefit from PT services to address deficits in strength, balance, and mobility in order to return to full function at home and decrease his risk for falls.    OBJECTIVE IMPAIRMENTS: decreased balance, decreased endurance, decreased strength, and pain.   ACTIVITY LIMITATIONS: standing  PARTICIPATION LIMITATIONS: cleaning, shopping, and community activity  PERSONAL FACTORS: Age, Time since onset of injury/illness/exacerbation, and 3+ comorbidities: OA, OSA, and memory impairments  are also affecting patient's functional outcome.   REHAB POTENTIAL: Good  CLINICAL DECISION MAKING: Evolving/moderate complexity  EVALUATION COMPLEXITY: Moderate   GOALS: Goals reviewed with patient? No  SHORT TERM GOALS: Target date: 01/15/2024  Pt will be independent with HEP in order to improve strength and balance in order to decrease fall risk and improve function at home. Baseline:  Goal status: INITIAL  LONG TERM GOALS: Target date: 02/26/2024  Pt will increase FOTO to at least 63 to demonstrate significant improvement in function at home related to balance  Baseline: 58 Goal status: INITIAL  2.  Pt will improve single leg balance to >10s on both legs in order to demonstrate clinically significant improvement in balance.   Baseline: 5-10s on each side; Goal status: INITIAL  3.  Pt will improve ABC by at least 13% in order to demonstrate clinically significant improvement in balance confidence.      Baseline: 12/12/23: 68.1% Goal status: DISCONTINUED;  4. Pt will decrease 5TSTS by at least 3 seconds in order to demonstrate clinically significant improvement in LE strength      Baseline: 26.2s Goal status: INITIAL  5. Pt will increase MiniBESTest by at least  4 points in order to demonstrate clinically significant improvement in balance.     Baseline: 12/12/23: 17/28; Goal status: INITIAL   PLAN: PT FREQUENCY: 1-2x/week  PT DURATION: 12 weeks  PLANNED INTERVENTIONS: Therapeutic exercises, Therapeutic activity, Neuromuscular re-education, Balance training, Gait training, Patient/Family education, Self Care, Joint mobilization, Joint manipulation, Vestibular training, Canalith repositioning, Orthotic/Fit training, DME instructions, Dry Needling, Electrical stimulation, Spinal manipulation, Spinal mobilization, Cryotherapy, Moist heat, Taping, Traction, Ultrasound, Ionotophoresis 4mg /ml Dexamethasone , Manual therapy, and Re-evaluation.  PLAN FOR NEXT SESSION: progress balance and strengthening, modify/review HEP as needed;   Izaak Sahr D Kassandra Meriweather PT, DPT, GCS  Johnell Bas 12/23/2023, 2:40 PM

## 2023-12-23 ENCOUNTER — Ambulatory Visit (INDEPENDENT_AMBULATORY_CARE_PROVIDER_SITE_OTHER): Payer: Medicare Other

## 2023-12-23 ENCOUNTER — Ambulatory Visit: Payer: Medicare Other | Attending: Neurology

## 2023-12-23 ENCOUNTER — Ambulatory Visit
Admission: EM | Admit: 2023-12-23 | Discharge: 2023-12-23 | Disposition: A | Discharge status: Home / Self Care | Payer: Medicare Other | Attending: Family Medicine | Admitting: Family Medicine

## 2023-12-23 DIAGNOSIS — J209 Acute bronchitis, unspecified: Secondary | ICD-10-CM

## 2023-12-23 DIAGNOSIS — J189 Pneumonia, unspecified organism: Secondary | ICD-10-CM | POA: Diagnosis not present

## 2023-12-23 DIAGNOSIS — M6281 Muscle weakness (generalized): Secondary | ICD-10-CM | POA: Insufficient documentation

## 2023-12-23 MED ORDER — AZITHROMYCIN 250 MG PO TABS: ORAL_TABLET | ORAL | 0 refills | Status: DC

## 2023-12-23 MED ORDER — AMOXICILLIN-POT CLAVULANATE 875-125 MG PO TABS: 1.0000 | ORAL_TABLET | Freq: Two times a day (BID) | ORAL | 0 refills | Status: AC

## 2023-12-23 MED ORDER — PREDNISONE 10 MG (21) PO TBPK: ORAL_TABLET | Freq: Every day | ORAL | 0 refills | Status: AC

## 2023-12-23 NOTE — Discharge Instructions (Addendum)
 Your chest xray did not show evidence of pneumonia though the radiologist has not yet read it. If they find something that I didn't, I will call you.   Stop at the pharmacy to pick up your steroids and antibiotics.  Your cough should gradually get better over the next week.  Return to the urgent care if you develop fever, shortness of breath or worsening cough.

## 2023-12-23 NOTE — ED Triage Notes (Signed)
 Sx x 1 week  Productive cough green mucus Nasal congestion

## 2023-12-23 NOTE — ED Provider Notes (Signed)
 [facility]    CSN: [id number] Arrival date & time: 12/23/23  1408      History   Chief Complaint Chief Complaint  Patient presents with   Cough   Nasal Congestion    HPI Shawn Khan is a 83 y.o. male.   HPI  History obtained from the patient. Shawn Khan presents for productive cough with green sputum and nasal congestion for the past week.  Sore throat has resolved.  He has been using nasal sinus rinses and taking cough medication with some relief.  His wife became concerned as he is coughing more and more each day.  There has been no recent fever.  He has been using a Diskus inhaler with some relief.  Denies history of asthma and smoking though.        Past Medical History:  Diagnosis Date   Arthritis    Chronic hoarseness    Complication of anesthesia    h/o nasal intubation x 1 hoarseness x 1 week   Contusion of chest    Cough    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   Gastritis    GERD (gastroesophageal reflux disease)    Hemorrhagic stroke (HCC)    more than 5 years ago   History of chicken pox    Hyperlipidemia    did not like crestor    Hypertension    Hypoglycemia    Hypogonadism in male    Insomnia    Low testosterone  in male    Osteoporosis    Prediabetes    A1C 5.9 06/25/18    Rotator cuff disorder    Sleep apnea    cpap   Stroke (HCC)    Vocal cord dysfunction    in 2018 damage per pt    Vocal cord nodule    Zenker diverticulum    1.7 cm     Patient Active Problem List   Diagnosis Date Noted   Acute cholecystitis 06/12/2021   Aortic atherosclerosis (HCC) 03/02/2019   Bradycardia 12/18/2018   Leg cramps 12/18/2018   Prediabetes 10/03/2018   Barrett's esophagus 10/03/2018   Penile ulcer 09/16/2018   Zenker's diverticulum 09/16/2018   Degenerative joint disease involving multiple joints on both sides of body 04/23/2018   OSA (obstructive sleep apnea) 04/23/2018   Urinary urgency 04/23/2018   Seborrheic keratosis 04/23/2018    Microscopic hematuria 11/20/2017   Acquired phimosis of penis 11/20/2017   Benign essential hypertension 11/20/2017   Benign prostatic hyperplasia with urinary obstruction 11/20/2017   Chronic hoarseness 11/20/2017   Contusion of chest 11/20/2017   Disorder of bone and articular cartilage 11/20/2017   Disorder of rotator cuff 11/20/2017   Dysphagia 11/20/2017   Gastroesophageal reflux disease 11/20/2017   Generalized osteoarthritis 11/20/2017   History of stroke without residual deficits 11/20/2017   Hypoxemia 11/20/2017   Impaired fasting glucose 11/20/2017   Increased frequency of urination 11/20/2017   Insomnia 11/20/2017   Backache 11/20/2017   Mixed hyperlipidemia 11/20/2017   Obstructive sleep apnea syndrome 11/20/2017   Osteoporosis 11/20/2017   Urinary urgency 11/20/2017   Shoulder joint painful on movement 11/20/2017   Seborrheic keratosis 11/20/2017   Rib pain 11/20/2017   Pharyngitis 11/20/2017   Male hypogonadism 11/20/2017    Past Surgical History:  Procedure Laterality Date   APPENDECTOMY     1955   BALLOON DILATION  12/09/2023   Procedure: BALLOON DILATION;  Surgeon: Onita Elspeth Sharper, DO;  Location: [facility] ENDOSCOPY;  Service: Gastroenterology;;   BIOPSY  12/09/2023  Procedure: BIOPSY;  Surgeon: Onita Elspeth Sharper, DO;  Location: [facility] ENDOSCOPY;  Service: Gastroenterology;;   ESOPHAGOGASTRODUODENOSCOPY (EGD) WITH PROPOFOL  N/A 12/09/2023   Procedure: ESOPHAGOGASTRODUODENOSCOPY (EGD) WITH PROPOFOL ;  Surgeon: Onita Elspeth Sharper, DO;  Location: [facility] ENDOSCOPY;  Service: Gastroenterology;  Laterality: N/A;   EYE SURGERY     b/l cataract   FEMUR FRACTURE SURGERY     FRACTURE SURGERY     IR GUIDED DRAIN W CATHETER PLACEMENT  06/13/2021   IR PERC CHOLECYSTOSTOMY  06/13/2021   LARYNGOSCOPY  09/17/2017   Procedure: LARYNGOSCOPY;  Surgeon: Herminio Miu, MD;  Location: [facility] ORS;  Service: ENT;;   PENILE BIOPSY     04/2018 ulcer with lichenoid  inflammation    rcr Bilateral    SHOULDER SURGERY     x3 right and left    THROAT SURGERY     vocal cord nodule   TONSILLECTOMY     1948       Home Medications    Prior to Admission medications   Medication Sig Start Date End Date Taking? Authorizing Provider  acetaminophen  (TYLENOL ) 500 MG tablet Take by mouth. 07/06/21  Yes [provider]  albuterol  (VENTOLIN  HFA) 108 (90 Base) MCG/ACT inhaler Inhale 1-2 puffs into the lungs every 6 (six) hours as needed for wheezing or shortness of breath. 03/15/22  Yes Merilee Andrea CROME, NP  alendronate (FOSAMAX) 70 MG tablet Take 70 mg by mouth once a week. Take with a full glass of water on an empty stomach.   Yes [provider]  amoxicillin -clavulanate (AUGMENTIN ) 875-125 MG tablet Take 1 tablet by mouth every 12 (twelve) hours. 12/23/23  Yes Tahj Njoku, DO  ascorbic acid  (VITAMIN C) 1000 MG tablet Take by mouth.   Yes [provider]  azithromycin  (ZITHROMAX  Z-PAK) 250 MG tablet Take 2 tablets on day 1 then 1 tablet daily 12/23/23  Yes Schylar Allard, DO  cholecalciferol  (VITAMIN D3) 25 MCG (1000 UNIT) tablet Take 1,000 Units by mouth daily.   Yes [provider]  Cholecalciferol  25 MCG (1000 UT) capsule Take by mouth.   Yes [provider]  clotrimazole -betamethasone  (LOTRISONE) cream Apply 1 application topically 2 (two) times daily. (Apply to the genital area as directed)   Yes [provider]  finasteride (PROSCAR) 5 MG tablet Take 5 mg by mouth.   Yes [provider]  gabapentin (NEURONTIN) 300 MG capsule Take by mouth. 05/29/22  Yes [provider]  guaiFENesin-codeine (CHERATUSSIN AC) 100-10 MG/5ML syrup Take 10 mLs by mouth at bedtime as needed for cough. 11/25/22  Yes Arvis Huxley B, PA-C  levocetirizine (XYZAL ) 5 MG tablet Take 1 tablet (5 mg total) by mouth every evening. 03/15/22  Yes Merilee Andrea L, NP  moxifloxacin  (VIGAMOX ) 0.5 % ophthalmic solution  Use 2 drops every 2 hours for 2 days.  Then use 2 drops every 6 hours for 5 days. 04/06/22  Yes Woods, Jaclyn M, PA-C  Multiple Vitamin (MULTI-VITAMIN) tablet Take by mouth.   Yes [provider]  oxyCODONE  (OXY IR/ROXICODONE ) 5 MG immediate release tablet Take 1 tablet (5 mg total) by mouth every 6 (six) hours as needed for severe pain or breakthrough pain. 06/15/21  Yes Schulz, Zachary R, PA-C  predniSONE  (STERAPRED UNI-PAK 21 TAB) 10 MG (21) TBPK tablet Take by mouth daily. Take 6 tabs by mouth daily for 1, then 5 tabs for 1 day, then 4 tabs for 1 day, then 3 tabs for 1 day, then 2 tabs for 1 day,  then 1 tab for 1 day. 12/23/23  Yes Kemper Hochman, DO  Sodium Chloride  Flush (NORMAL SALINE FLUSH) 0.9 % SOLN 5 mLs by Intracatheter route 2 (two) times daily 08/08/21  Yes [provider]  tamsulosin  (FLOMAX ) 0.4 MG CAPS capsule Take 1 capsule (0.4 mg total) by mouth 2 (two) times daily. appt further refills 04/01/20  Yes McLean-Scocuzza, Randine SAILOR, MD  venlafaxine  XR (EFFEXOR -XR) 37.5 MG 24 hr capsule Take 37.5 mg by mouth daily.   Yes [provider]  vitamin B-12 (CYANOCOBALAMIN ) 1000 MCG tablet Take 1,000 mcg by mouth daily.   Yes [provider]  zinc  sulfate 220 (50 Zn) MG capsule Take 220 mg by mouth daily.   Yes [provider]    Family History Family History  Problem Relation Age of Onset   Hearing loss Father    Other Father        brain tumor   Parkinson's disease Father    Arthritis Father    Cancer Mother        pancreatic    Diabetes Sister    Arthritis Son    Stroke Maternal Grandfather    Stroke Paternal Grandmother    Stroke Paternal Grandfather    Diabetes Sister    Hyperlipidemia Sister    Hypertension Sister    Asthma Daughter    Hypertension Daughter    Asthma Son    Prostate cancer Neg Hx    Bladder Cancer Neg Hx    Kidney cancer Neg Hx     Social History Social History   Tobacco Use   Smoking status: Never    Smokeless tobacco: Never  Vaping Use   Vaping status: Never Used  Substance Use Topics   Alcohol use: No   Drug use: No     Allergies   Patient has no known allergies.   Review of Systems Review of Systems: negative unless otherwise stated in HPI.      Physical Exam Triage Vital Signs ED Triage Vitals  Encounter Vitals Group     BP 12/23/23 1446 126/76     Systolic BP Percentile --      Diastolic BP Percentile --      Pulse Rate 12/23/23 1446 76     Resp 12/23/23 1446 19     Temp 12/23/23 1446 98.6 F (37 C)     Temp Source 12/23/23 1446 Oral     SpO2 12/23/23 1446 97 %     Weight --      Height --      Head Circumference --      Peak Flow --      Pain Score 12/23/23 1445 0     Pain Loc --      Pain Education --      Exclude from Growth Chart --    No data found.  Updated Vital Signs BP 126/76 (BP Location: Left Arm)   Pulse 76   Temp 98.6 F (37 C) (Oral)   Resp 19   SpO2 97%   Visual Acuity Right Eye Distance:   Left Eye Distance:   Bilateral Distance:    Right Eye Near:   Left Eye Near:    Bilateral Near:     Physical Exam GEN:     alert, non-toxic appearing male in no distress    HENT:  mucus membranes moist, clear nasal discharge EYES:   no scleral injection or discharge RESP:  no increased work of breathing, coarse breath sounds  bilaterally, no wheezing CVS:   regular rate and rhythm Skin:   warm and dry, no rash on visible skin    UC Treatments / Results  Labs (all labs ordered are listed, but only abnormal results are displayed) Labs Reviewed - No data to display  EKG   Radiology DG Chest 2 View Result Date: 12/23/2023 CLINICAL DATA:  Productive cough EXAM: CHEST - 2 VIEW COMPARISON:  03/15/2022 FINDINGS: Low lung volumes. Mild interstitial and ground-glass opacity at the lingula suspicious for acute infiltrate. No pleural effusion. Stable cardiomediastinal silhouette with aortic atherosclerosis. IMPRESSION: Low lung volumes with  mild interstitial and ground-glass opacity at the lingula suspicious for acute infiltrate. Radiographic follow-up to resolution is recommended. Electronically Signed   By: Luke Bun M.D.   On: 12/23/2023 15:55    Procedures Procedures (including critical care time)  Medications Ordered in UC Medications - No data to display  Initial Impression / Assessment and Plan / UC Course  I have reviewed the triage vital signs and the nursing notes.  Pertinent labs & imaging results that were available during my care of the patient were reviewed by me and considered in my medical decision making (see chart for details).       Pt is a 83 y.o. male who presents for 1 week of cough and nasal congestion that are not improving.  Shawn Khan is  afebrile here without recent antipyretics. Satting well on room air. Overall pt is  non-toxic appearing, well hydrated, without respiratory distress. Pulmonary exam is remarkable for coarse breath sounds bilaterally with cough.  After shared decision making, we will pursue chest x-ray to evaluate for pneumonia.  COVID, RSV and influenza testing deferred due to length of symptoms.   Chest xray personally reviewed by me without focal pneumonia, pleural effusion, cardiomegaly or pneumothorax. Patient aware the radiologist has not read his xray and is comfortable with the preliminary read by me. Will review radiologist read when available and call patient if a change in plan is warranted.  Pt agreeable to this plan prior to discharge.   Treat  with steroids and antibiotics as below.  Typical duration of symptoms discussed. Return and ED precautions given and patient voiced understanding.   Discussed MDM, treatment plan and plan for follow-up with patient who agrees with plan.   Radiologist impression reviewed and notes a groundglass opacity at the lingula suspicious for pneumonia.  Patient called and a secure voicemail left to stop by his pharmacy and pick up a second  antibiotic for pneumonia.    Final Clinical Impressions(s) / UC Diagnoses   Final diagnoses:  Community acquired pneumonia of right lung, unspecified part of lung     Discharge Instructions      Your chest xray did not show evidence of pneumonia though the radiologist has not yet read it. If they find something that I didn't, I will call you.   Stop at the pharmacy to pick up your steroids and antibiotics.  Your cough should gradually get better over the next week.  Return to the urgent care if you develop fever, shortness of breath or worsening cough.     ED Prescriptions     Medication Sig Dispense Auth. Provider   predniSONE  (STERAPRED UNI-PAK 21 TAB) 10 MG (21) TBPK tablet Take by mouth daily. Take 6 tabs by mouth daily for 1, then 5 tabs for 1 day, then 4 tabs for 1 day, then 3 tabs for 1 day, then 2 tabs for 1 day,  then 1 tab for 1 day. 21 tablet Adley Mazurowski, DO   azithromycin  (ZITHROMAX  Z-PAK) 250 MG tablet Take 2 tablets on day 1 then 1 tablet daily 6 tablet Legend Pecore, DO   amoxicillin -clavulanate (AUGMENTIN ) 875-125 MG tablet Take 1 tablet by mouth every 12 (twelve) hours. 10 tablet Theodosia Bahena, DO      PDMP not reviewed this encounter.   Nithila Sumners, DO 12/23/23 1627

## 2023-12-30 ENCOUNTER — Ambulatory Visit: Payer: Medicare Other

## 2023-12-30 DIAGNOSIS — M6281 Muscle weakness (generalized): Secondary | ICD-10-CM | POA: Diagnosis not present

## 2023-12-30 NOTE — Therapy (Signed)
 OUTPATIENT PHYSICAL THERAPY BALANCE TREATMENT   Patient Name: Shawn Khan MRN: [id number] DOB:February 01, 1941, 83 y.o., male Today's Date: 12/31/2023  END OF SESSION:  PT End of Session - 12/31/23 1044     Visit Number 5    Number of Visits 25    Date for PT Re-Evaluation 02/26/24    Authorization Type eval: 12/04/23    PT Start Time 1315    PT Stop Time 1400    PT Time Calculation (min) 45 min    Equipment Utilized During Treatment Gait belt    Activity Tolerance Patient tolerated treatment well    Behavior During Therapy WFL for tasks assessed/performed             Past Medical History:  Diagnosis Date   Arthritis    Chronic hoarseness    Complication of anesthesia    h/o nasal intubation x 1 hoarseness x 1 week   Contusion of chest    Cough    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   Gastritis    GERD (gastroesophageal reflux disease)    Hemorrhagic stroke (HCC)    more than 5 years ago   History of chicken pox    Hyperlipidemia    did not like crestor    Hypertension    Hypoglycemia    Hypogonadism in male    Insomnia    Low testosterone  in male    Osteoporosis    Prediabetes    A1C 5.9 06/25/18    Rotator cuff disorder    Sleep apnea    cpap   Stroke (HCC)    Vocal cord dysfunction    in 2018 damage per pt    Vocal cord nodule    Zenker diverticulum    1.7 cm    Past Surgical History:  Procedure Laterality Date   APPENDECTOMY     1955   BALLOON DILATION  12/09/2023   Procedure: BALLOON DILATION;  Surgeon: Onita Elspeth Sharper, DO;  Location: [facility] ENDOSCOPY;  Service: Gastroenterology;;   BIOPSY  12/09/2023   Procedure: BIOPSY;  Surgeon: Onita Elspeth Sharper, DO;  Location: [facility] ENDOSCOPY;  Service: Gastroenterology;;   ESOPHAGOGASTRODUODENOSCOPY (EGD) WITH PROPOFOL  N/A 12/09/2023   Procedure: ESOPHAGOGASTRODUODENOSCOPY (EGD) WITH PROPOFOL ;  Surgeon: Onita Elspeth Sharper, DO;  Location: [facility] ENDOSCOPY;  Service: Gastroenterology;  Laterality: N/A;    EYE SURGERY     b/l cataract   FEMUR FRACTURE SURGERY     FRACTURE SURGERY     IR GUIDED DRAIN W CATHETER PLACEMENT  06/13/2021   IR PERC CHOLECYSTOSTOMY  06/13/2021   LARYNGOSCOPY  09/17/2017   Procedure: LARYNGOSCOPY;  Surgeon: Herminio Miu, MD;  Location: [facility] ORS;  Service: ENT;;   PENILE BIOPSY     04/2018 ulcer with lichenoid inflammation    rcr Bilateral    SHOULDER SURGERY     x3 right and left    THROAT SURGERY     vocal cord nodule   TONSILLECTOMY     1948   Patient Active Problem List   Diagnosis Date Noted   Acute cholecystitis 06/12/2021   Aortic atherosclerosis (HCC) 03/02/2019   Bradycardia 12/18/2018   Leg cramps 12/18/2018   Prediabetes 10/03/2018   Barrett's esophagus 10/03/2018   Penile ulcer 09/16/2018   Zenker's diverticulum 09/16/2018   Degenerative joint disease involving multiple joints on both sides of body 04/23/2018   OSA (obstructive sleep apnea) 04/23/2018   Urinary urgency 04/23/2018   Seborrheic keratosis 04/23/2018   Microscopic hematuria 11/20/2017   Acquired phimosis  of penis 11/20/2017   Benign essential hypertension 11/20/2017   Benign prostatic hyperplasia with urinary obstruction 11/20/2017   Chronic hoarseness 11/20/2017   Contusion of chest 11/20/2017   Disorder of bone and articular cartilage 11/20/2017   Disorder of rotator cuff 11/20/2017   Dysphagia 11/20/2017   Gastroesophageal reflux disease 11/20/2017   Generalized osteoarthritis 11/20/2017   History of stroke without residual deficits 11/20/2017   Hypoxemia 11/20/2017   Impaired fasting glucose 11/20/2017   Increased frequency of urination 11/20/2017   Insomnia 11/20/2017   Backache 11/20/2017   Mixed hyperlipidemia 11/20/2017   Obstructive sleep apnea syndrome 11/20/2017   Osteoporosis 11/20/2017   Urinary urgency 11/20/2017   Shoulder joint painful on movement 11/20/2017   Seborrheic keratosis 11/20/2017   Rib pain 11/20/2017   Pharyngitis 11/20/2017   Male  hypogonadism 11/20/2017    PCP: Auston Reyes BIRCH, MD  REFERRING PROVIDER: Maree Jannett POUR, MD   REFERRING DIAG: R53.81 (ICD-10-CM) - Other malaise   RATIONALE FOR EVALUATION AND TREATMENT: Rehabilitation  THERAPY DIAG: Muscle weakness (generalized)  ONSET DATE: 1 year (approximate)  FOLLOW-UP APPT SCHEDULED WITH REFERRING PROVIDER: Yes   FROM INITIAL EVALUATION SUBJECTIVE:                                                                                                                                                                                         SUBJECTIVE STATEMENT:  Progressive weakness  PERTINENT HISTORY:  Pt reports progressive weakness for the last year as well as issues with endurance. No known cause. History of chronic bilateral knee pain and L shoulder pain s/p surgery. He also complains of bilateral wrist pain. History of CVA 30 years ago. He has some residual RUE numbness from the CVA but otherwise no residual deficits. Pt reports that he struggles with his memory. He has been seeing floaters recently and has an appt with an eye doctor today after his PT evaluation.   10/12/2023 MRI Brain without contrast  IMPRESSION:  1. No evidence of acute intracranial abnormality.  2. Chronic hemorrhage or chronic hemorrhagic infarct within the left  subinsular white matter/basal ganglia.  3. Minimal chronic small-vessel ischemic changes elsewhere within  the cerebral white matter.  4. Mild generalized cerebral atrophy.  Pain: Yes, bilateral knee pain Numbness/Tingling: Yes, RUE Focal Weakness: No Recent changes in overall health/medication: No Prior history of physical therapy for balance:  No Dominant hand: right Imaging: Yes  Red flags: Negative for bowel/bladder changes, saddle paresthesia, personal history of cancer, h/o spinal tumors, h/o compression fx, h/o abdominal aneurysm, abdominal pain, chills/fever, night sweats, nausea, vomiting, unrelenting pain, first  onset of insidious LBP <19  y/o  PRECAUTIONS: Fall  WEIGHT BEARING RESTRICTIONS: No  FALLS: Has patient fallen in last 6 months? No  Living Environment Lives with: lives with their spouse, daughter and granddaughter next door. Grandson lives 10 minutes away. Lives in: House/apartment, ramp as well as 9 steps with bilateral wide rails Has following equipment at home: Single point cane, Walker - 2 wheeled, Environmental Consultant - 4 wheeled, and power. Walk in shower with seat and grab bars  Prior level of function: Independent  Occupational demands: Retired curator for [employer]: [place], playing games, helping daughter with her armed forces technical officer business, caring for his chickens  Patient Goals: Get back to gardening Pt would like to improve his endurance   OBJECTIVE:   Patient Surveys  FOTO: 58, predicted improvement to 45 ABC: To be completed (given to pt to take home)  Cognition Patient is oriented to person, place, and time.  Recent memory is intact.  Remote memory is intact.  Attention span and concentration are intact.  Expressive speech is intact.  Patient's fund of knowledge is within normal limits for educational level.    Gross Musculoskeletal Assessment Tremor: None Bulk: Normal Tone: Normal  Posture: No gross abnormalities noted in standing or seated posture  AROM Deferred  LE MMT: MMT (out of 5) Right  Left   Hip flexion 4 4  Hip extension    Hip abduction (seated) 4 4  Hip adduction (seated) 4 4  Hip internal rotation    Hip external rotation    Knee flexion (seated) 5 5  Knee extension 5 5  Ankle dorsiflexion 5 5  Ankle plantarflexion    Ankle inversion    Ankle eversion    (* = pain; Blank rows = not tested)  UE MMT grossly WNL and symmetrical  Sensation Deferred  Reflexes Deferred  Cranial Nerves Deferred  Coordination/Cerebellar Deferred  Bed mobility: Deferred  Transfers: Assistive device utilized: None  Sit to  stand: Complete Independence Stand to sit: Complete Independence Chair to chair: Complete Independence Deferred  Curb:  Deferred  Stairs: Level of Assistance: Modified independence Stair Negotiation Technique: Alternating Pattern  with Bilateral Rails Number of Stairs: 4  Height of Stairs: 6  Comments: No safety concerns noted  Gait: Gait pattern: WFL Distance walked: 100' Assistive device utilized: None Level of assistance: Complete Independence Comments: No gross deficits identified  Functional Outcome Measures  Results Comments  BERG 53/56 Mild deficits  DGI 23/24 WNL  FGA    TUG 10.7 seconds WNL  5TSTS 26.2 seconds Bilateral knee pain  6 Minute Walk Test    10 Meter Gait Speed Self-selected: 9.9 s = 1.01 m/s; Fastest: 7.9 s = 1.27 m/s WNL  (Blank rows = not tested)   TODAY'S TREATMENT    SUBJECTIVE: Pt reports that he is doing well today. Continues with chronic R CMC joint pain. No specific questions upon arrival today.   PAIN: Chronic R CMC joint pain;   Neuromuscular Re-education  Airex staggered balance with front foot on 6 step horizontal and vertical head turns x30s each; Airex feet together horizontal and vertical head turns x 30s each; Airex feet together eyes closed x 30s each; Airex FT ball passes from SPT with PT guarding, varying height of return pass x multiple bouts; Airex tandem balance ball passes from SPT with PT guarding, varying height of return x multiple bouts; Forward/backward gait in hall with vertical and horizontal head turns x multiple bouts; Forward/backward gait in hall with ball toss  from PT with SPT guarding x multiple bouts, varying height of return; Forward marching gait with contralateral hand to knee x multiple bouts Airex with Blaze pods on wall with 6 pods, 5 distracting, 4 x 60s;   Ther-ex  NuStep L2-4 x 7 minutes for BLE strengthening and warm-up during interval history (2 minutes unbilled);  Nautilus resisted 50#  forward, backward, R lateral, and L lateral x 3 each direction; Sit to stand without UE support holding 8# med ball 2 x 10;   Not performed: Forward 6 step-ups without UE support x 10 BLE; Rockerboard balance in A/P and R/L orientations eyes open x 30s each direction; Rockerboard eyes open weight shifts A/P and R/L orientations x 30s each; Rockerboard eyes open horizontal and vertical head turns A/P and R/L orientations x 30s each; Airex alternating 12 step taps without UE support x 15 BLE; Airex staggered balance with front foot on 12 step alternating forward LE x 30s each; Tandem static balance without UE support x 30s on each side; Tandem balance without UE support and vertical/horizontal head turns x 30s each on each side; Tandem gait in // bars without UE support x multiple lengths; Heel/toe raises in // bars without UE support 3s hold x 10 each;  Single leg balance practice holding blue tband on tension x approximately 30-45s on each leg; Airex FT ball passes around body to therapist with return pass on opposite side, therapist varying height of return pass x multiple bouts toward each side; Total Gym (TG) Level 22 (L22) double leg squats 2 x 15 (second set performed holding 8# dumbbell); TG L22 double leg heel raises 2 x 20;    PATIENT EDUCATION:  Education details: Pt educated throughout session about proper posture and technique with exercises. Improved exercise technique, movement at target joints, use of target muscles after min to mod verbal, visual, tactile cues.  Person educated: Patient Education method: Explanation, Demonstration, Verbal cues, and Handouts Education comprehension: verbalized understanding   HOME EXERCISE PROGRAM:  Access Code: [email or web address] URL: [email or web address] Date: 12/12/2023 Prepared by: Selinda Eck  Exercises - Standing Tandem Balance with Counter Support  - 1 x daily - 7 x weekly - 3 reps - 30s hold - Standing Tandem  Balance with Counter Support (Mirrored)  - 1 x daily - 7 x weekly - 3 reps - 30s hold - Tandem Stance with Head Rotation  - 1 x daily - 7 x weekly - 3 reps - 30s hold - Tandem Stance with Head Rotation (Mirrored)  - 1 x daily - 7 x weekly - 3 reps - 30s hold - Tandem Stance with Head Nods   - 1 x daily - 7 x weekly - 3 reps - 30s hold - Tandem Stance with Head Nods  (Mirrored)  - 1 x daily - 7 x weekly - 3 reps - 30s hold - Heel Raise  - 1-2 x daily - 7 x weekly - 2 sets - 10 reps - 3s hold   ASSESSMENT:  CLINICAL IMPRESSION: Progressed balance and strengthening exercises during session today. No HEP modifications. He requires intermittent seated rest breaks throughout session today but overall endurance is improved. Pt encouraged to follow-up as scheduled. Plan to progress balance and strength exercises at future sessions. He will benefit from PT services to address deficits in strength, balance, and mobility in order to return to full function at home and decrease his risk for falls.    OBJECTIVE IMPAIRMENTS: decreased balance, decreased endurance, decreased strength, and  pain.   ACTIVITY LIMITATIONS: standing  PARTICIPATION LIMITATIONS: cleaning, shopping, and community activity  PERSONAL FACTORS: Age, Time since onset of injury/illness/exacerbation, and 3+ comorbidities: OA, OSA, and memory impairments  are also affecting patient's functional outcome.   REHAB POTENTIAL: Good  CLINICAL DECISION MAKING: Evolving/moderate complexity  EVALUATION COMPLEXITY: Moderate   GOALS: Goals reviewed with patient? No  SHORT TERM GOALS: Target date: 01/15/2024  Pt will be independent with HEP in order to improve strength and balance in order to decrease fall risk and improve function at home. Baseline:  Goal status: INITIAL   LONG TERM GOALS: Target date: 02/26/2024  Pt will increase FOTO to at least 63 to demonstrate significant improvement in function at home related to balance   Baseline: 58 Goal status: INITIAL  2.  Pt will improve single leg balance to >10s on both legs in order to demonstrate clinically significant improvement in balance.   Baseline: 5-10s on each side; Goal status: INITIAL  3.  Pt will improve ABC by at least 13% in order to demonstrate clinically significant improvement in balance confidence.      Baseline: 12/12/23: 68.1% Goal status: DISCONTINUED;  4. Pt will decrease 5TSTS by at least 3 seconds in order to demonstrate clinically significant improvement in LE strength      Baseline: 26.2s Goal status: INITIAL  5. Pt will increase MiniBESTest by at least 4 points in order to demonstrate clinically significant improvement in balance.     Baseline: 12/12/23: 17/28; Goal status: INITIAL   PLAN: PT FREQUENCY: 1-2x/week  PT DURATION: 12 weeks  PLANNED INTERVENTIONS: Therapeutic exercises, Therapeutic activity, Neuromuscular re-education, Balance training, Gait training, Patient/Family education, Self Care, Joint mobilization, Joint manipulation, Vestibular training, Canalith repositioning, Orthotic/Fit training, DME instructions, Dry Needling, Electrical stimulation, Spinal manipulation, Spinal mobilization, Cryotherapy, Moist heat, Taping, Traction, Ultrasound, Ionotophoresis 4mg /ml Dexamethasone , Manual therapy, and Re-evaluation.  PLAN FOR NEXT SESSION: progress balance and strengthening, modify/review HEP as needed;  Brocha Gilliam SPT Jason D Huprich PT, DPT, GCS  Huprich,Jason 12/31/2023, 10:50 AM

## 2024-01-01 NOTE — H&P (Signed)
Pre-Procedure H&P   Patient ID: Shawn Khan is a 83 y.o. male.  Gastroenterology Provider: [name], DO  Referring Provider: Fransico Setters, NP PCP: Marguarite Arbour, MD  Date: 01/02/2024  HPI [name] is a 83 y.o. male who presents today for Esophagogastroduodenoscopy for Esophageal stricture, dysphagia, abnormal barium swallow study, Zenker's diverticulum .  Patient's barium swallow in November 2024 demonstrates his known Zenker's diverticulum.  At that point in time the barium tablet became stuck in the gastroesophageal junction.  He underwent an EGD on December 09, 2023 with a stenosis noted at the GEJ at approximately 40 cm.  This was dilated with a through-the-scope balloon at 12, 13, 15 mm.  Superficial disruption noted at 15 mm and no further dilation was pursued at that time.  Patient has been on PPI and plan for further dilation today. He is gastric biopsies from the previous EGD were negative for H. pylori.  Swallowing has improved since last dilation.  No odynophagia or weight change   Past Medical History:  Diagnosis Date   Arthritis    Chronic hoarseness    Complication of anesthesia    h/o nasal intubation x 1 hoarseness x 1 week   Contusion of chest    Cough    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   Gastritis    GERD (gastroesophageal reflux disease)    Hemorrhagic stroke (HCC)    more than 5 years ago   History of chicken pox    Hyperlipidemia    did not like crestor    Hypertension    Hypoglycemia    Hypogonadism in male    Insomnia    Low testosterone in male    Osteoporosis    Prediabetes    A1C 5.9 06/25/18    Rotator cuff disorder    Sleep apnea    cpap   Stroke [facility])    Vocal cord dysfunction    in 2018 damage per pt    Vocal cord nodule    Zenker diverticulum    1.7 cm     Past Surgical History:  Procedure Laterality Date   APPENDECTOMY     1955   BALLOON DILATION  12/09/2023   Procedure: BALLOON DILATION;   Surgeon: [name], DO;  Location: [facility] ENDOSCOPY;  Service: Gastroenterology;;   BIOPSY  12/09/2023   Procedure: BIOPSY;  Surgeon: [name], DO;  Location: [facility] ENDOSCOPY;  Service: Gastroenterology;;   ESOPHAGOGASTRODUODENOSCOPY (EGD) WITH PROPOFOL N/A 12/09/2023   Procedure: ESOPHAGOGASTRODUODENOSCOPY (EGD) WITH PROPOFOL;  Surgeon: [name], DO;  Location: [facility] ENDOSCOPY;  Service: Gastroenterology;  Laterality: N/A;   EYE SURGERY     b/l cataract   FEMUR FRACTURE SURGERY     FRACTURE SURGERY     IR GUIDED DRAIN W CATHETER PLACEMENT  06/13/2021   IR PERC CHOLECYSTOSTOMY  06/13/2021   LARYNGOSCOPY  09/17/2017   Procedure: LARYNGOSCOPY;  Surgeon: Linus Salmons, MD;  Location: [facility] ORS;  Service: ENT;;   PENILE BIOPSY     04/2018 ulcer with lichenoid inflammation    rcr Bilateral    SHOULDER SURGERY     x3 right and left    THROAT SURGERY     vocal cord nodule   TONSILLECTOMY     1948    Family History No h/o GI disease or malignancy  Review of Systems  Constitutional:  Negative for activity change, appetite change, chills, diaphoresis, fatigue, fever and unexpected weight change.  HENT:  Positive for trouble swallowing. Negative for voice change.   Respiratory:  Negative for shortness of breath and wheezing.   Cardiovascular:  Negative for chest pain, palpitations and leg swelling.  Gastrointestinal:  Negative for abdominal distention, abdominal pain, anal bleeding, blood in stool, constipation, diarrhea, nausea and vomiting.  Musculoskeletal:  Negative for arthralgias and myalgias.  Skin:  Negative for color change and pallor.  Neurological:  Negative for dizziness, syncope and weakness.  Psychiatric/Behavioral:  Negative for confusion. The patient is not nervous/anxious.   All other systems reviewed and are negative.    Medications No current facility-administered medications on file prior to encounter.   Current Outpatient  Medications on File Prior to Encounter  Medication Sig Dispense Refill   acetaminophen (TYLENOL) 500 MG tablet Take by mouth.     albuterol (VENTOLIN HFA) 108 (90 Base) MCG/ACT inhaler Inhale 1-2 puffs into the lungs every 6 (six) hours as needed for wheezing or shortness of breath. 18 g 0   alendronate (FOSAMAX) 70 MG tablet Take 70 mg by mouth once a week. Take with a full glass of water on an empty stomach.     ascorbic acid (VITAMIN C) 1000 MG tablet Take by mouth.     cholecalciferol (VITAMIN D3) 25 MCG (1000 UNIT) tablet Take 1,000 Units by mouth daily.     Cholecalciferol 25 MCG (1000 UT) capsule Take by mouth.     finasteride (PROSCAR) 5 MG tablet Take 5 mg by mouth.     gabapentin (NEURONTIN) 300 MG capsule Take by mouth.     guaiFENesin-codeine (CHERATUSSIN AC) 100-10 MG/5ML syrup Take 10 mLs by mouth at bedtime as needed for cough. 118 mL 0   levocetirizine (XYZAL) 5 MG tablet Take 1 tablet (5 mg total) by mouth every evening. 30 tablet 0   moxifloxacin (VIGAMOX) 0.5 % ophthalmic solution Use 2 drops every 2 hours for 2 days.  Then use 2 drops every 6 hours for 5 days. 3 mL 0   Multiple Vitamin (MULTI-VITAMIN) tablet Take by mouth.     oxyCODONE (OXY IR/ROXICODONE) 5 MG immediate release tablet Take 1 tablet (5 mg total) by mouth every 6 (six) hours as needed for severe pain or breakthrough pain. 20 tablet 0   venlafaxine XR (EFFEXOR-XR) 37.5 MG 24 hr capsule Take 37.5 mg by mouth daily.     vitamin B-12 (CYANOCOBALAMIN) 1000 MCG tablet Take 1,000 mcg by mouth daily.     clotrimazole-betamethasone (LOTRISONE) cream Apply 1 application topically 2 (two) times daily. (Apply to the genital area as directed)     Sodium Chloride Flush (NORMAL SALINE FLUSH) 0.9 % SOLN 5 mLs by Intracatheter route 2 (two) times daily     tamsulosin (FLOMAX) 0.4 MG CAPS capsule Take 1 capsule (0.4 mg total) by mouth 2 (two) times daily. appt further refills 180 capsule 1   zinc sulfate 220 (50 Zn) MG capsule  Take 220 mg by mouth daily.      Pertinent medications related to GI and procedure were reviewed by me with the patient prior to the procedure   Current Facility-Administered Medications:    0.9 %  sodium chloride infusion, , Intravenous, Continuous, [name], DO, Last Rate: 20 mL/hr at 01/02/24 1042, New Bag at 01/02/24 1042  sodium chloride 20 mL/hr at 01/02/24 1042       No Known Allergies Allergies were reviewed by me prior to the procedure  Objective   Body mass index is 28.19 kg/m. Vitals:   01/02/24  1038  BP: (!) 131/103  Pulse: (!) 54  Temp: (!) 97.3 F (36.3 C)  TempSrc: Temporal  SpO2: 98%  Weight: 81.6 kg     Physical Exam Vitals and nursing note reviewed.  Constitutional:      General: He is not in acute distress.    Appearance: Normal appearance. He is not ill-appearing, toxic-appearing or diaphoretic.  HENT:     Head: Normocephalic and atraumatic.     Nose: Nose normal.     Mouth/Throat:     Mouth: Mucous membranes are moist.     Pharynx: Oropharynx is clear.  Eyes:     General: No scleral icterus.    Extraocular Movements: Extraocular movements intact.  Cardiovascular:     Rate and Rhythm: Regular rhythm. Bradycardia present.     Heart sounds: Normal heart sounds. No murmur heard.    No friction rub. No gallop.  Pulmonary:     Effort: Pulmonary effort is normal. No respiratory distress.     Breath sounds: Normal breath sounds. No wheezing, rhonchi or rales.  Abdominal:     General: Bowel sounds are normal. There is no distension.     Palpations: Abdomen is soft.     Tenderness: There is no abdominal tenderness. There is no guarding or rebound.  Musculoskeletal:     Cervical back: Neck supple.     Right lower leg: No edema.     Left lower leg: No edema.  Skin:    General: Skin is warm and dry.     Coloration: Skin is not jaundiced or pale.  Neurological:     General: No focal deficit present.     Mental Status: He is alert  and oriented to person, place, and time. Mental status is at baseline.  Psychiatric:        Mood and Affect: Mood normal.        Behavior: Behavior normal.        Thought Content: Thought content normal.        Judgment: Judgment normal.      Assessment:  [name] is a 83 y.o. male  who presents today for Esophagogastroduodenoscopy for Esophageal stricture, dysphagia, abnormal barium swallow study, Zenker's diverticulum .  Plan:  Esophagogastroduodenoscopy with possible intervention today  Esophagogastroduodenoscopy with possible biopsy, control of bleeding, polypectomy, and interventions as necessary has been discussed with the patient/patient representative. Informed consent was obtained from the patient/patient representative after explaining the indication, nature, and risks of the procedure including but not limited to death, bleeding, perforation, missed neoplasm/lesions, cardiorespiratory compromise, and reaction to medications. Opportunity for questions was given and appropriate answers were provided. Patient/patient representative has verbalized understanding is amenable to undergoing the procedure.   [name], DO  [facility] Gastroenterology  Portions of the record may have been created with voice recognition software. Occasional wrong-word or 'sound-a-like' substitutions may have occurred due to the inherent limitations of voice recognition software.  Read the chart carefully and recognize, using context, where substitutions may have occurred.

## 2024-01-02 ENCOUNTER — Other Ambulatory Visit: Payer: Self-pay

## 2024-01-02 ENCOUNTER — Ambulatory Visit
Admission: RE | Admit: 2024-01-02 | Discharge: 2024-01-02 | Disposition: A | Discharge status: Home / Self Care | Payer: Medicare Other | Attending: Gastroenterology | Admitting: Gastroenterology

## 2024-01-02 ENCOUNTER — Ambulatory Visit: Payer: Medicare Other

## 2024-01-02 ENCOUNTER — Ambulatory Visit: Payer: Medicare Other | Admitting: Anesthesiology

## 2024-01-02 ENCOUNTER — Encounter
Admission: RE | Disposition: A | Discharge status: Home / Self Care | Payer: Self-pay | Source: Home / Self Care | Attending: Gastroenterology

## 2024-01-02 ENCOUNTER — Encounter: Payer: Self-pay | Admitting: Gastroenterology

## 2024-01-02 DIAGNOSIS — K449 Diaphragmatic hernia without obstruction or gangrene: Secondary | ICD-10-CM | POA: Insufficient documentation

## 2024-01-02 DIAGNOSIS — K225 Diverticulum of esophagus, acquired: Secondary | ICD-10-CM | POA: Diagnosis not present

## 2024-01-02 DIAGNOSIS — K222 Esophageal obstruction: Secondary | ICD-10-CM | POA: Diagnosis present

## 2024-01-02 DIAGNOSIS — K2970 Gastritis, unspecified, without bleeding: Secondary | ICD-10-CM | POA: Insufficient documentation

## 2024-01-02 HISTORY — PX: ESOPHAGOGASTRODUODENOSCOPY: SHX5428

## 2024-01-02 HISTORY — PX: ESOPHAGEAL DILATION: SHX303

## 2024-01-02 SURGERY — EGD (ESOPHAGOGASTRODUODENOSCOPY)
Anesthesia: General

## 2024-01-02 MED ORDER — LIDOCAINE HCL (PF) 2 % IJ SOLN
INTRAMUSCULAR | Status: AC
  Filled 2024-01-02: qty 5

## 2024-01-02 MED ORDER — LIDOCAINE HCL (CARDIAC) PF 100 MG/5ML IV SOSY
PREFILLED_SYRINGE | INTRAVENOUS | Status: DC | PRN
  Administered 2024-01-02: 100 mg via INTRAVENOUS

## 2024-01-02 MED ORDER — PROPOFOL 10 MG/ML IV BOLUS
INTRAVENOUS | Status: DC | PRN
  Administered 2024-01-02: 80 mg via INTRAVENOUS
  Administered 2024-01-02: 20 mg via INTRAVENOUS

## 2024-01-02 MED ORDER — SODIUM CHLORIDE 0.9 % IV SOLN: INTRAVENOUS | Status: DC

## 2024-01-02 NOTE — Interval H&P Note (Addendum)
History and Physical Interval Note: Preprocedure H&P from 01/02/24  was reviewed and there was no interval change after seeing and examining the patient.  Written consent was obtained from the patient after discussion of risks, benefits, and alternatives. Patient has consented to proceed with Esophagogastroduodenoscopy with possible intervention   01/02/2024 11:24 AM  Shawn Khan  has presented today for surgery, with the diagnosis of Dysphagia, unspecified type (R13.10) Zenker's diverticulum (K22.5) Abnormal barium swallow (R93.3).  The various methods of treatment have been discussed with the patient and family. After consideration of risks, benefits and other options for treatment, the patient has consented to  Procedure(s): ESOPHAGOGASTRODUODENOSCOPY (EGD) (N/A) as a surgical intervention.  The patient's history has been reviewed, patient examined, no change in status, stable for surgery.  I have reviewed the patient's chart and labs.  Questions were answered to the patient's satisfaction.     [name]

## 2024-01-02 NOTE — Transfer of Care (Signed)
Immediate Anesthesia Transfer of Care Note  Patient: Shawn Khan  Procedure(s) Performed: ESOPHAGOGASTRODUODENOSCOPY (EGD) ESOPHAGEAL DILATION  Patient Location: PACU and Endoscopy Unit  Anesthesia Type:General  Level of Consciousness: awake, alert , oriented, and patient cooperative  Airway & Oxygen Therapy: Patient Spontanous Breathing  Post-op Assessment: Report given to RN and Post -op Vital signs reviewed and stable  Post vital signs: Reviewed and stable  Last Vitals:  Vitals Value Taken Time  BP 107/84 01/02/24 1142  Temp 97.1   Pulse 59 01/02/24 1143  Resp 16 01/02/24 1143  SpO2 95 % 01/02/24 1143  Vitals shown include unfiled device data.  Last Pain:  Vitals:   01/02/24 1038  TempSrc: Temporal         Complications: No notable events documented.

## 2024-01-02 NOTE — Anesthesia Preprocedure Evaluation (Signed)
Anesthesia Evaluation  Patient identified by MRN, date of birth, ID band Patient awake    Reviewed: Allergy & Precautions, NPO status , Patient's Chart, lab work & pertinent test results  History of Anesthesia Complications (+) history of anesthetic complications  Airway Mallampati: III  TM Distance: <3 FB Neck ROM: full    Dental  (+) Chipped   Pulmonary sleep apnea    Pulmonary exam normal        Cardiovascular hypertension, Normal cardiovascular exam     Neuro/Psych CVA  negative psych ROS   GI/Hepatic Neg liver ROS,GERD  ,,  Endo/Other  negative endocrine ROS    Renal/GU negative Renal ROS  negative genitourinary   Musculoskeletal   Abdominal   Peds  Hematology negative hematology ROS (+)   Anesthesia Other Findings Past Medical History: No date: Arthritis No date: Chronic hoarseness No date: Complication of anesthesia     Comment:  h/o nasal intubation x 1 hoarseness x 1 week No date: Contusion of chest No date: Cough     Comment:  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 No date: Gastritis No date: GERD (gastroesophageal reflux disease) No date: Hemorrhagic stroke (HCC)     Comment:  more than 5 years ago No date: History of chicken pox No date: Hyperlipidemia     Comment:  did not like crestor  No date: Hypertension No date: Hypoglycemia No date: Hypogonadism in male No date: Insomnia No date: Low testosterone in male No date: Osteoporosis No date: Prediabetes     Comment:  A1C 5.9 06/25/18  No date: Rotator cuff disorder No date: Sleep apnea     Comment:  cpap No date: Stroke [facility]) No date: Vocal cord dysfunction     Comment:  in 2018 damage per pt  No date: Vocal cord nodule No date: Zenker diverticulum     Comment:  1.7 cm   Past Surgical History: No date: APPENDECTOMY     Comment:  1955 No date: EYE SURGERY     Comment:  b/l cataract No date: FEMUR FRACTURE SURGERY No date: FRACTURE  SURGERY 06/13/2021: IR GUIDED DRAIN W CATHETER PLACEMENT 06/13/2021: IR PERC CHOLECYSTOSTOMY 09/17/2017: LARYNGOSCOPY     Comment:  Procedure: LARYNGOSCOPY;  Surgeon: Linus Salmons, MD;              Location: [facility] ORS;  Service: ENT;; No date: PENILE BIOPSY     Comment:  04/2018 ulcer with lichenoid inflammation  No date: rcr; Bilateral No date: SHOULDER SURGERY     Comment:  x3 right and left  No date: THROAT SURGERY     Comment:  vocal cord nodule No date: TONSILLECTOMY     Comment:  1948  BMI    Body Mass Index: 28.51 kg/m      Reproductive/Obstetrics negative OB ROS                             Anesthesia Physical Anesthesia Plan  ASA: 3  Anesthesia Plan: General   Post-op Pain Management:    Induction: Intravenous  PONV Risk Score and Plan: Propofol infusion and TIVA  Airway Management Planned: Natural Airway and Nasal Cannula  Additional Equipment:   Intra-op Plan:   Post-operative Plan:   Informed Consent:      Dental Advisory Given  Plan Discussed with: Anesthesiologist, CRNA and Surgeon  Anesthesia Plan Comments:        Anesthesia Quick Evaluation

## 2024-01-02 NOTE — Op Note (Signed)
[facility] Gastroenterology Patient Name: Shawn Khan Procedure Date: 01/02/2024 11:18 AM MRN: [id number] Account #: [id number] Date of Birth: 1941-12-04 Admit Type: Outpatient Age: 83 Room: [facility] ENDO ROOM 1 Gender: Male Note Status: Finalized Instrument Name: Upper Endoscope [id number] Procedure:             Upper GI endoscopy Indications:           Dysphagia, For therapy of esophageal stricture Providers:             Trenda Moots, DO Referring MD:          Duane Lope. Judithann Sheen, MD (Referring MD) Medicines:             Monitored Anesthesia Care Complications:         No immediate complications. Estimated blood loss:                         Minimal. Procedure:             Pre-Anesthesia Assessment:                        - Prior to the procedure, a History and Physical was                         performed, and patient medications and allergies were                         reviewed. The patient is competent. The risks and                         benefits of the procedure and the sedation options and                         risks were discussed with the patient. All questions                         were answered and informed consent was obtained.                         Patient identification and proposed procedure were                         verified by the physician, the nurse, the anesthetist                         and the technician in the endoscopy suite. Mental                         Status Examination: alert and oriented. Airway                         Examination: normal oropharyngeal airway and neck                         mobility. Respiratory Examination: clear to                         auscultation. CV Examination: RRR, no murmurs, no S3  or S4. Prophylactic Antibiotics: The patient does not                         require prophylactic antibiotics. Prior                         Anticoagulants: The patient has  taken no anticoagulant                         or antiplatelet agents. ASA Grade Assessment: III - A                         patient with severe systemic disease. After reviewing                         the risks and benefits, the patient was deemed in                         satisfactory condition to undergo the procedure. The                         anesthesia plan was to use monitored anesthesia care                         (MAC). Immediately prior to administration of                         medications, the patient was re-assessed for adequacy                         to receive sedatives. The heart rate, respiratory                         rate, oxygen saturations, blood pressure, adequacy of                         pulmonary ventilation, and response to care were                         monitored throughout the procedure. The physical                         status of the patient was re-assessed after the                         procedure.                        After obtaining informed consent, the endoscope was                         passed under direct vision. Throughout the procedure,                         the patient's blood pressure, pulse, and oxygen                         saturations were monitored continuously. The Endoscope  was introduced through the mouth, and advanced to the                         second part of duodenum. The upper GI endoscopy was                         accomplished without difficulty. The patient tolerated                         the procedure well. Findings:      The duodenal bulb, first portion of the duodenum and second portion of       the duodenum were normal. Estimated blood loss: none.      Localized mild inflammation characterized by adherent blood was found in       the gastric antrum. Estimated blood loss: none.      A small hiatal hernia was present. Estimated blood loss: none.      The Z-line was regular.  Estimated blood loss: none.      Normal mucosa was found in the entire esophagus. Estimated blood loss:       none.      One benign-appearing, intrinsic mild (non-circumferential scarring)       stenosis was found 40 cm from the incisors. This stenosis measured 1.5       cm (inner diameter) x less than one cm (in length). The stenosis was       traversed. A TTS dilator was passed through the scope. Dilation with a       15-16.5-18 mm balloon dilator was performed to 15 mm and 16.5 mm. The       dilation site was examined following endoscope reinsertion and showed       mild mucosal disruption. Small superficial tear noted after reaching       16.68mm- no further dilation performed. Estimated blood loss was minimal.      The exam of the esophagus was otherwise normal. Impression:            - Normal duodenal bulb, first portion of the duodenum                         and second portion of the duodenum.                        - Gastritis.                        - Small hiatal hernia.                        - Z-line regular.                        - Normal mucosa was found in the entire esophagus.                        - Benign-appearing esophageal stenosis. Dilated.                        - No specimens collected. Recommendation:        - Patient has a contact number available for  emergencies. The signs and symptoms of potential                         delayed complications were discussed with the patient.                         Return to normal activities tomorrow. Written                         discharge instructions were provided to the patient.                        - Discharge patient to home.                        - Soft diet today.                        - Continue present medications.                        - No ibuprofen, naproxen, or other non-steroidal                         anti-inflammatory drugs for 5 days.                        - Repeat upper  endoscopy PRN for retreatment.                        - Return to referring physician as previously                         scheduled.                        - The findings and recommendations were discussed with                         the patient.                        - The findings and recommendations were discussed with                         the patient's family. Procedure Code(s):     --- Professional ---                        [phone], Esophagogastroduodenoscopy, flexible,                         transoral; with transendoscopic balloon dilation of                         esophagus (less than 30 mm diameter) Diagnosis Code(s):     --- Professional ---                        K29.70, Gastritis, unspecified, without bleeding                        K44.9, Diaphragmatic hernia without  obstruction or                         gangrene                        K22.2, Esophageal obstruction                        R13.10, Dysphagia, unspecified CPT copyright 2022 American Medical Association. All rights reserved. The codes documented in this report are preliminary and upon coder review may  be revised to meet current compliance requirements. Attending Participation:      I personally performed the entire procedure. Elfredia Nevins, DO [name] DO, DO 01/02/2024 11:47:02 AM This report has been signed electronically. Number of Addenda: 0 Note Initiated On: 01/02/2024 11:18 AM Estimated Blood Loss:  Estimated blood loss was minimal.      [facility]

## 2024-01-02 NOTE — Anesthesia Postprocedure Evaluation (Signed)
Anesthesia Post Note  Patient: Shawn Khan  Procedure(s) Performed: ESOPHAGOGASTRODUODENOSCOPY (EGD) ESOPHAGEAL DILATION  Patient location during evaluation: Endoscopy Anesthesia Type: General Level of consciousness: awake and alert Pain management: pain level controlled Vital Signs Assessment: post-procedure vital signs reviewed and stable Respiratory status: spontaneous breathing, nonlabored ventilation and respiratory function stable Cardiovascular status: blood pressure returned to baseline and stable Postop Assessment: no apparent nausea or vomiting Anesthetic complications: no   No notable events documented.   Last Vitals:  Vitals:   01/02/24 1142 01/02/24 1156  BP: 107/84 128/80  Pulse: 60 (!) 55  Resp: 16 14  Temp: (!) 35.9 C   SpO2: 95% 96%    Last Pain:  Vitals:   01/02/24 1156  TempSrc:   PainSc: 0-No pain                 Foye Deer

## 2024-01-03 ENCOUNTER — Encounter: Payer: Self-pay | Admitting: Gastroenterology

## 2024-01-06 DIAGNOSIS — M6281 Muscle weakness (generalized): Secondary | ICD-10-CM

## 2024-01-07 ENCOUNTER — Ambulatory Visit: Payer: Medicare Other

## 2024-01-07 DIAGNOSIS — M6281 Muscle weakness (generalized): Secondary | ICD-10-CM

## 2024-01-07 NOTE — Therapy (Signed)
OUTPATIENT PHYSICAL THERAPY BALANCE TREATMENT   Patient Name: Shawn Khan MRN: [id number] DOB:1941/05/25, 83 y.o., male Today's Date: 01/07/2024  END OF SESSION:  PT End of Session - 01/07/24 0801     Visit Number 6    Number of Visits 25    Date for PT Re-Evaluation 02/26/24    Authorization Type eval: 12/04/23    PT Start Time 0802    PT Stop Time 0845    PT Time Calculation (min) 43 min    Equipment Utilized During Treatment Gait belt    Activity Tolerance Patient tolerated treatment well    Behavior During Therapy WFL for tasks assessed/performed            Past Medical History:  Diagnosis Date   Arthritis    Chronic hoarseness    Complication of anesthesia    h/o nasal intubation x 1 hoarseness x 1 week   Contusion of chest    Cough    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   Gastritis    GERD (gastroesophageal reflux disease)    Hemorrhagic stroke (HCC)    more than 5 years ago   History of chicken pox    Hyperlipidemia    did not like crestor    Hypertension    Hypoglycemia    Hypogonadism in male    Insomnia    Low testosterone in male    Osteoporosis    Prediabetes    A1C 5.9 06/25/18    Rotator cuff disorder    Sleep apnea    cpap   Stroke (HCC)    Vocal cord dysfunction    in 2018 damage per pt    Vocal cord nodule    Zenker diverticulum    1.7 cm    Past Surgical History:  Procedure Laterality Date   APPENDECTOMY     1955   BALLOON DILATION  12/09/2023   Procedure: BALLOON DILATION;  Surgeon: [name], DO;  Location: [facility] ENDOSCOPY;  Service: Gastroenterology;;   BIOPSY  12/09/2023   Procedure: BIOPSY;  Surgeon: [name], DO;  Location: [facility] ENDOSCOPY;  Service: Gastroenterology;;   ESOPHAGEAL DILATION  01/02/2024   Procedure: ESOPHAGEAL DILATION;  Surgeon: [name], DO;  Location: [facility] ENDOSCOPY;  Service: Gastroenterology;;   ESOPHAGOGASTRODUODENOSCOPY N/A 01/02/2024   Procedure:  ESOPHAGOGASTRODUODENOSCOPY (EGD);  Surgeon: [name], DO;  Location: [facility] ENDOSCOPY;  Service: Gastroenterology;  Laterality: N/A;   ESOPHAGOGASTRODUODENOSCOPY (EGD) WITH PROPOFOL N/A 12/09/2023   Procedure: ESOPHAGOGASTRODUODENOSCOPY (EGD) WITH PROPOFOL;  Surgeon: [name], DO;  Location: [facility] ENDOSCOPY;  Service: Gastroenterology;  Laterality: N/A;   EYE SURGERY     b/l cataract   FEMUR FRACTURE SURGERY     FRACTURE SURGERY     IR GUIDED DRAIN W CATHETER PLACEMENT  06/13/2021   IR PERC CHOLECYSTOSTOMY  06/13/2021   LARYNGOSCOPY  09/17/2017   Procedure: LARYNGOSCOPY;  Surgeon: Linus Salmons, MD;  Location: [facility] ORS;  Service: ENT;;   PENILE BIOPSY     04/2018 ulcer with lichenoid inflammation    rcr Bilateral    SHOULDER SURGERY     x3 right and left    THROAT SURGERY     vocal cord nodule   TONSILLECTOMY     1948   Patient Active Problem List   Diagnosis Date Noted   Acute cholecystitis 06/12/2021   Aortic atherosclerosis (HCC) 03/02/2019   Bradycardia 12/18/2018   Leg cramps 12/18/2018   Prediabetes 10/03/2018   Barrett's esophagus 10/03/2018  Penile ulcer 09/16/2018   Zenker's diverticulum 09/16/2018   Degenerative joint disease involving multiple joints on both sides of body 04/23/2018   OSA (obstructive sleep apnea) 04/23/2018   Urinary urgency 04/23/2018   Seborrheic keratosis 04/23/2018   Microscopic hematuria 11/20/2017   Acquired phimosis of penis 11/20/2017   Benign essential hypertension 11/20/2017   Benign prostatic hyperplasia with urinary obstruction 11/20/2017   Chronic hoarseness 11/20/2017   Contusion of chest 11/20/2017   Disorder of bone and articular cartilage 11/20/2017   Disorder of rotator cuff 11/20/2017   Dysphagia 11/20/2017   Gastroesophageal reflux disease 11/20/2017   Generalized osteoarthritis 11/20/2017   History of stroke without residual deficits 11/20/2017   Hypoxemia 11/20/2017   Impaired fasting glucose  11/20/2017   Increased frequency of urination 11/20/2017   Insomnia 11/20/2017   Backache 11/20/2017   Mixed hyperlipidemia 11/20/2017   Obstructive sleep apnea syndrome 11/20/2017   Osteoporosis 11/20/2017   Urinary urgency 11/20/2017   Shoulder joint painful on movement 11/20/2017   Seborrheic keratosis 11/20/2017   Rib pain 11/20/2017   Pharyngitis 11/20/2017   Male hypogonadism 11/20/2017    PCP: Marguarite Arbour, MD  REFERRING PROVIDER: Lonell Face, MD   REFERRING DIAG: R53.81 (ICD-10-CM) - Other malaise   RATIONALE FOR EVALUATION AND TREATMENT: Rehabilitation  THERAPY DIAG: Muscle weakness (generalized)  ONSET DATE: 1 year (approximate)  FOLLOW-UP APPT SCHEDULED WITH REFERRING PROVIDER: Yes   FROM INITIAL EVALUATION SUBJECTIVE:                                                                                                                                                                                         SUBJECTIVE STATEMENT:  Progressive weakness  PERTINENT HISTORY:  Pt reports progressive weakness for the last year as well as issues with endurance. No known cause. History of chronic bilateral knee pain and L shoulder pain s/p surgery. He also complains of bilateral wrist pain. History of CVA "30 years ago." He has some residual RUE numbness from the CVA but otherwise no residual deficits. Pt reports that he struggles with his memory. He has been seeing floaters recently and has an appt with an eye doctor today after his PT evaluation.   10/12/2023 MRI Brain without contrast  IMPRESSION:  1. No evidence of acute intracranial abnormality.  2. Chronic hemorrhage or chronic hemorrhagic infarct within the left  subinsular white matter/basal ganglia.  3. Minimal chronic small-vessel ischemic changes elsewhere within  the cerebral white matter.  4. Mild generalized cerebral atrophy.  Pain: Yes, bilateral knee pain Numbness/Tingling: Yes, RUE Focal Weakness:  No Recent changes in overall health/medication: No Prior history of  physical therapy for balance:  No Dominant hand: right Imaging: Yes  Red flags: Negative for bowel/bladder changes, saddle paresthesia, personal history of cancer, h/o spinal tumors, h/o compression fx, h/o abdominal aneurysm, abdominal pain, chills/fever, night sweats, nausea, vomiting, unrelenting pain, first onset of insidious LBP <19 y/o  PRECAUTIONS: Fall  WEIGHT BEARING RESTRICTIONS: No  FALLS: Has patient fallen in last 6 months? No  Living Environment Lives with: lives with their spouse, daughter and granddaughter next door. Grandson lives 10 minutes away. Lives in: House/apartment, ramp as well as 9 steps with bilateral wide rails Has following equipment at home: Single point cane, Walker - 2 wheeled, Environmental consultant - 4 wheeled, and power. Walk in shower with seat and grab bars  Prior level of function: Independent  Occupational demands: Retired Curator for [employer]: [place], playing games, helping daughter with her Armed forces technical officer business, caring for his chickens  Patient Goals: "Get back to gardening" Pt would like to improve his endurance   OBJECTIVE:   Patient Surveys  FOTO: 58, predicted improvement to 63 ABC: To be completed (given to pt to take home)  Cognition Patient is oriented to person, place, and time.  Recent memory is intact.  Remote memory is intact.  Attention span and concentration are intact.  Expressive speech is intact.  Patient's fund of knowledge is within normal limits for educational level.    Gross Musculoskeletal Assessment Tremor: None Bulk: Normal Tone: Normal  Posture: No gross abnormalities noted in standing or seated posture  AROM Deferred  LE MMT: MMT (out of 5) Right  Left   Hip flexion 4 4  Hip extension    Hip abduction (seated) 4 4  Hip adduction (seated) 4 4  Hip internal rotation    Hip external rotation    Knee flexion  (seated) 5 5  Knee extension 5 5  Ankle dorsiflexion 5 5  Ankle plantarflexion    Ankle inversion    Ankle eversion    (* = pain; Blank rows = not tested)  UE MMT grossly WNL and symmetrical  Sensation Deferred  Reflexes Deferred  Cranial Nerves Deferred  Coordination/Cerebellar Deferred  Bed mobility: Deferred  Transfers: Assistive device utilized: None  Sit to stand: Complete Independence Stand to sit: Complete Independence Chair to chair: Complete Independence Deferred  Curb:  Deferred  Stairs: Level of Assistance: Modified independence Stair Negotiation Technique: Alternating Pattern  with Bilateral Rails Number of Stairs: 4  Height of Stairs: 6"  Comments: No safety concerns noted  Gait: Gait pattern: WFL Distance walked: 100' Assistive device utilized: None Level of assistance: Complete Independence Comments: No gross deficits identified  Functional Outcome Measures  Results Comments  BERG 53/56 Mild deficits  DGI 23/24 WNL  FGA    TUG 10.7 seconds WNL  5TSTS 26.2 seconds Bilateral knee pain  6 Minute Walk Test    10 Meter Gait Speed Self-selected: 9.9 s = 1.01 m/s; Fastest: 7.9 s = 1.27 m/s WNL  (Blank rows = not tested)   TODAY'S TREATMENT    SUBJECTIVE: Pt reports that he is doing well today. Continues with chronic R CMC joint pain. He saw rheumatology who referred him to a hand surgeon. No specific questions upon arrival today.   PAIN: Chronic R CMC joint pain;   Neuromuscular Re-education  Blaze pods, 2 on floor and 2 on 6" step, toe taps 2 x 60s, first set with one color and second set with 3 distracting colors; Blaze pods toe taps  in star pattern, 5 pods with 4 distracting colors 2 x 60s; Airex 6" and 12" alternating step taps x 15 each on both sides; Airex feet together horizontal and vertical head turns x 30s each; Airex feet together eyes closed x 30s each;   Ther-ex  NuStep L2-4 x 10 minutes for BLE strengthening and  warm-up during interval history (2 minutes unbilled);  Total Gym (TG) Level 22 (L22) double leg squats x 15; TG L22 single leg squats 2 x 10 BLE, more difficult on RLE; TG L22 double leg heel raises x 20;    Not performed: Forward 6" step-ups without UE support x 10 BLE; Rockerboard balance in A/P and R/L orientations eyes open x 30s each direction; Rockerboard eyes open weight shifts A/P and R/L orientations x 30s each; Rockerboard eyes open horizontal and vertical head turns A/P and R/L orientations x 30s each; Tandem static balance without UE support x 30s on each side; Tandem balance without UE support and vertical/horizontal head turns x 30s each on each side; Tandem gait in // bars without UE support x multiple lengths; Heel/toe raises in // bars without UE support 3s hold x 10 each;  Single leg balance practice holding blue tband on tension x approximately 30-45s on each leg; Airex FT ball passes around body to therapist with return pass on opposite side, therapist varying height of return pass x multiple bouts toward each side; Airex FT ball passes from SPT with PT guarding, varying height of return pass x multiple bouts; Forward/backward gait in hall with vertical and horizontal head turns x multiple bouts; Forward/backward gait in hall with ball toss from PT with SPT guarding x multiple bouts, varying height of return; Forward marching gait with contralateral hand to knee x multiple bouts Nautilus resisted 50# forward, backward, R lateral, and L lateral x 3 each direction; Sit to stand without UE support holding 8# med ball 2 x 10;    PATIENT EDUCATION:  Education details: Pt educated throughout session about proper posture and technique with exercises. Improved exercise technique, movement at target joints, use of target muscles after min to mod verbal, visual, tactile cues.  Person educated: Patient Education method: Explanation, Demonstration, Verbal cues, and  Handouts Education comprehension: verbalized understanding   HOME EXERCISE PROGRAM:  Access Code: [email or web address] URL: [email or web address] Date: 12/12/2023 Prepared by: Ria Comment  Exercises - Standing Tandem Balance with Counter Support  - 1 x daily - 7 x weekly - 3 reps - 30s hold - Standing Tandem Balance with Counter Support (Mirrored)  - 1 x daily - 7 x weekly - 3 reps - 30s hold - Tandem Stance with Head Rotation  - 1 x daily - 7 x weekly - 3 reps - 30s hold - Tandem Stance with Head Rotation (Mirrored)  - 1 x daily - 7 x weekly - 3 reps - 30s hold - Tandem Stance with Head Nods   - 1 x daily - 7 x weekly - 3 reps - 30s hold - Tandem Stance with Head Nods  (Mirrored)  - 1 x daily - 7 x weekly - 3 reps - 30s hold - Heel Raise  - 1-2 x daily - 7 x weekly - 2 sets - 10 reps - 3s hold   ASSESSMENT:  CLINICAL IMPRESSION: Progressed balance and strengthening exercises during session today. No HEP modifications. He requires intermittent seated rest breaks throughout session today but overall endurance is improved. Added distracting colors during Blaze Pod taps for cognitive challenge.  Pt encouraged to follow-up as scheduled. Plan to progress balance and strength exercises at future sessions. He will benefit from PT services to address deficits in strength, balance, and mobility in order to return to full function at home and decrease his risk for falls.    OBJECTIVE IMPAIRMENTS: decreased balance, decreased endurance, decreased strength, and pain.   ACTIVITY LIMITATIONS: standing  PARTICIPATION LIMITATIONS: cleaning, shopping, and community activity  PERSONAL FACTORS: Age, Time since onset of injury/illness/exacerbation, and 3+ comorbidities: OA, OSA, and memory impairments  are also affecting patient's functional outcome.   REHAB POTENTIAL: Good  CLINICAL DECISION MAKING: Evolving/moderate complexity  EVALUATION COMPLEXITY: Moderate   GOALS: Goals reviewed with  patient? No  SHORT TERM GOALS: Target date: 01/15/2024  Pt will be independent with HEP in order to improve strength and balance in order to decrease fall risk and improve function at home. Baseline:  Goal status: INITIAL   LONG TERM GOALS: Target date: 02/26/2024  Pt will increase FOTO to at least 63 to demonstrate significant improvement in function at home related to balance  Baseline: 58 Goal status: INITIAL  2.  Pt will improve single leg balance to >10s on both legs in order to demonstrate clinically significant improvement in balance.   Baseline: 5-10s on each side; Goal status: INITIAL  3.  Pt will improve ABC by at least 13% in order to demonstrate clinically significant improvement in balance confidence.      Baseline: 12/12/23: 68.1% Goal status: DISCONTINUED;  4. Pt will decrease 5TSTS by at least 3 seconds in order to demonstrate clinically significant improvement in LE strength      Baseline: 26.2s Goal status: INITIAL  5. Pt will increase MiniBESTest by at least 4 points in order to demonstrate clinically significant improvement in balance.     Baseline: 12/12/23: 17/28; Goal status: INITIAL   PLAN: PT FREQUENCY: 1-2x/week  PT DURATION: 12 weeks  PLANNED INTERVENTIONS: Therapeutic exercises, Therapeutic activity, Neuromuscular re-education, Balance training, Gait training, Patient/Family education, Self Care, Joint mobilization, Joint manipulation, Vestibular training, Canalith repositioning, Orthotic/Fit training, DME instructions, Dry Needling, Electrical stimulation, Spinal manipulation, Spinal mobilization, Cryotherapy, Moist heat, Taping, Traction, Ultrasound, Ionotophoresis 4mg /ml Dexamethasone, Manual therapy, and Re-evaluation.  PLAN FOR NEXT SESSION: progress balance and strengthening, modify/review HEP as needed;   Sharalyn Ink Anjanae Woehrle PT, DPT, GCS  Aisley Whan 01/07/2024, 10:48 AM

## 2024-01-09 ENCOUNTER — Ambulatory Visit: Payer: Medicare Other

## 2024-01-09 DIAGNOSIS — M6281 Muscle weakness (generalized): Secondary | ICD-10-CM | POA: Diagnosis not present

## 2024-01-09 NOTE — Therapy (Signed)
OUTPATIENT PHYSICAL THERAPY BALANCE TREATMENT   Patient Name: Shawn Khan MRN: [id number] DOB:06/08/1941, 83 y.o., male Today's Date: 01/10/2024  END OF SESSION:  PT End of Session - 01/09/24 1309     Visit Number 7    Number of Visits 25    Date for PT Re-Evaluation 02/26/24    Authorization Type eval: 12/04/23    PT Start Time 1315    PT Stop Time 1400    PT Time Calculation (min) 45 min    Equipment Utilized During Treatment Gait belt    Activity Tolerance Patient tolerated treatment well    Behavior During Therapy WFL for tasks assessed/performed            Past Medical History:  Diagnosis Date   Arthritis    Chronic hoarseness    Complication of anesthesia    h/o nasal intubation x 1 hoarseness x 1 week   Contusion of chest    Cough    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   Gastritis    GERD (gastroesophageal reflux disease)    Hemorrhagic stroke (HCC)    more than 5 years ago   History of chicken pox    Hyperlipidemia    did not like crestor    Hypertension    Hypoglycemia    Hypogonadism in male    Insomnia    Low testosterone in male    Osteoporosis    Prediabetes    A1C 5.9 06/25/18    Rotator cuff disorder    Sleep apnea    cpap   Stroke [facility])    Vocal cord dysfunction    in 2018 damage per pt    Vocal cord nodule    Zenker diverticulum    1.7 cm    Past Surgical History:  Procedure Laterality Date   APPENDECTOMY     1955   BALLOON DILATION  12/09/2023   Procedure: BALLOON DILATION;  Surgeon: [name], DO;  Location: [facility] ENDOSCOPY;  Service: Gastroenterology;;   BIOPSY  12/09/2023   Procedure: BIOPSY;  Surgeon: [name], DO;  Location: [facility] ENDOSCOPY;  Service: Gastroenterology;;   ESOPHAGEAL DILATION  01/02/2024   Procedure: ESOPHAGEAL DILATION;  Surgeon: [name], DO;  Location: [facility] ENDOSCOPY;  Service: Gastroenterology;;   ESOPHAGOGASTRODUODENOSCOPY N/A 01/02/2024   Procedure:  ESOPHAGOGASTRODUODENOSCOPY (EGD);  Surgeon: [name], DO;  Location: [facility] ENDOSCOPY;  Service: Gastroenterology;  Laterality: N/A;   ESOPHAGOGASTRODUODENOSCOPY (EGD) WITH PROPOFOL N/A 12/09/2023   Procedure: ESOPHAGOGASTRODUODENOSCOPY (EGD) WITH PROPOFOL;  Surgeon: [name], DO;  Location: [facility] ENDOSCOPY;  Service: Gastroenterology;  Laterality: N/A;   EYE SURGERY     b/l cataract   FEMUR FRACTURE SURGERY     FRACTURE SURGERY     IR GUIDED DRAIN W CATHETER PLACEMENT  06/13/2021   IR PERC CHOLECYSTOSTOMY  06/13/2021   LARYNGOSCOPY  09/17/2017   Procedure: LARYNGOSCOPY;  Surgeon: Linus Salmons, MD;  Location: [facility] ORS;  Service: ENT;;   PENILE BIOPSY     04/2018 ulcer with lichenoid inflammation    rcr Bilateral    SHOULDER SURGERY     x3 right and left    THROAT SURGERY     vocal cord nodule   TONSILLECTOMY     1948   Patient Active Problem List   Diagnosis Date Noted   Acute cholecystitis 06/12/2021   Aortic atherosclerosis (HCC) 03/02/2019   Bradycardia 12/18/2018   Leg cramps 12/18/2018   Prediabetes 10/03/2018   Barrett's esophagus 10/03/2018  Penile ulcer 09/16/2018   Zenker's diverticulum 09/16/2018   Degenerative joint disease involving multiple joints on both sides of body 04/23/2018   OSA (obstructive sleep apnea) 04/23/2018   Urinary urgency 04/23/2018   Seborrheic keratosis 04/23/2018   Microscopic hematuria 11/20/2017   Acquired phimosis of penis 11/20/2017   Benign essential hypertension 11/20/2017   Benign prostatic hyperplasia with urinary obstruction 11/20/2017   Chronic hoarseness 11/20/2017   Contusion of chest 11/20/2017   Disorder of bone and articular cartilage 11/20/2017   Disorder of rotator cuff 11/20/2017   Dysphagia 11/20/2017   Gastroesophageal reflux disease 11/20/2017   Generalized osteoarthritis 11/20/2017   History of stroke without residual deficits 11/20/2017   Hypoxemia 11/20/2017   Impaired fasting glucose  11/20/2017   Increased frequency of urination 11/20/2017   Insomnia 11/20/2017   Backache 11/20/2017   Mixed hyperlipidemia 11/20/2017   Obstructive sleep apnea syndrome 11/20/2017   Osteoporosis 11/20/2017   Urinary urgency 11/20/2017   Shoulder joint painful on movement 11/20/2017   Seborrheic keratosis 11/20/2017   Rib pain 11/20/2017   Pharyngitis 11/20/2017   Male hypogonadism 11/20/2017    PCP: Marguarite Arbour, MD  REFERRING PROVIDER: Lonell Face, MD   REFERRING DIAG: R53.81 (ICD-10-CM) - Other malaise   RATIONALE FOR EVALUATION AND TREATMENT: Rehabilitation  THERAPY DIAG: Muscle weakness (generalized)  ONSET DATE: 1 year (approximate)  FOLLOW-UP APPT SCHEDULED WITH REFERRING PROVIDER: Yes   FROM INITIAL EVALUATION SUBJECTIVE:                                                                                                                                                                                         SUBJECTIVE STATEMENT:  Progressive weakness  PERTINENT HISTORY:  Pt reports progressive weakness for the last year as well as issues with endurance. No known cause. History of chronic bilateral knee pain and L shoulder pain s/p surgery. He also complains of bilateral wrist pain. History of CVA "30 years ago." He has some residual RUE numbness from the CVA but otherwise no residual deficits. Pt reports that he struggles with his memory. He has been seeing floaters recently and has an appt with an eye doctor today after his PT evaluation.   10/12/2023 MRI Brain without contrast  IMPRESSION:  1. No evidence of acute intracranial abnormality.  2. Chronic hemorrhage or chronic hemorrhagic infarct within the left  subinsular white matter/basal ganglia.  3. Minimal chronic small-vessel ischemic changes elsewhere within  the cerebral white matter.  4. Mild generalized cerebral atrophy.  Pain: Yes, bilateral knee pain Numbness/Tingling: Yes, RUE Focal Weakness:  No Recent changes in overall health/medication: No Prior history of  physical therapy for balance:  No Dominant hand: right Imaging: Yes  Red flags: Negative for bowel/bladder changes, saddle paresthesia, personal history of cancer, h/o spinal tumors, h/o compression fx, h/o abdominal aneurysm, abdominal pain, chills/fever, night sweats, nausea, vomiting, unrelenting pain, first onset of insidious LBP <19 y/o  PRECAUTIONS: Fall  WEIGHT BEARING RESTRICTIONS: No  FALLS: Has patient fallen in last 6 months? No  Living Environment Lives with: lives with their spouse, daughter and granddaughter next door. Grandson lives 10 minutes away. Lives in: House/apartment, ramp as well as 9 steps with bilateral wide rails Has following equipment at home: Single point cane, Walker - 2 wheeled, Environmental consultant - 4 wheeled, and power. Walk in shower with seat and grab bars  Prior level of function: Independent  Occupational demands: Retired Curator for [employer]: [place], playing games, helping daughter with her Armed forces technical officer business, caring for his chickens  Patient Goals: "Get back to gardening" Pt would like to improve his endurance   OBJECTIVE:   Patient Surveys  FOTO: 58, predicted improvement to 63 ABC: To be completed (given to pt to take home)  Cognition Patient is oriented to person, place, and time.  Recent memory is intact.  Remote memory is intact.  Attention span and concentration are intact.  Expressive speech is intact.  Patient's fund of knowledge is within normal limits for educational level.    Gross Musculoskeletal Assessment Tremor: None Bulk: Normal Tone: Normal  Posture: No gross abnormalities noted in standing or seated posture  AROM Deferred  LE MMT: MMT (out of 5) Right  Left   Hip flexion 4 4  Hip extension    Hip abduction (seated) 4 4  Hip adduction (seated) 4 4  Hip internal rotation    Hip external rotation    Knee flexion  (seated) 5 5  Knee extension 5 5  Ankle dorsiflexion 5 5  Ankle plantarflexion    Ankle inversion    Ankle eversion    (* = pain; Blank rows = not tested)  UE MMT grossly WNL and symmetrical  Sensation Deferred  Reflexes Deferred  Cranial Nerves Deferred  Coordination/Cerebellar Deferred  Bed mobility: Deferred  Transfers: Assistive device utilized: None  Sit to stand: Complete Independence Stand to sit: Complete Independence Chair to chair: Complete Independence Deferred  Curb:  Deferred  Stairs: Level of Assistance: Modified independence Stair Negotiation Technique: Alternating Pattern  with Bilateral Rails Number of Stairs: 4  Height of Stairs: 6"  Comments: No safety concerns noted  Gait: Gait pattern: WFL Distance walked: 100' Assistive device utilized: None Level of assistance: Complete Independence Comments: No gross deficits identified  Functional Outcome Measures  Results Comments  BERG 53/56 Mild deficits  DGI 23/24 WNL  FGA    TUG 10.7 seconds WNL  5TSTS 26.2 seconds Bilateral knee pain  6 Minute Walk Test    10 Meter Gait Speed Self-selected: 9.9 s = 1.01 m/s; Fastest: 7.9 s = 1.27 m/s WNL  (Blank rows = not tested)   TODAY'S TREATMENT    SUBJECTIVE: Pt reports that he is doing well today. Continues with chronic R CMC joint pain. Pt reports residual quad soreness from PT session two days ago (01/08/24). He saw rheumatology who referred him to a hand surgeon. He is still waiting to see the hand surgeon. No specific questions upon arrival today.   PAIN: Chronic R CMC joint pain;   Neuromuscular Re-education  Tandem static balance on beam without UE support eyes open/closed  x 30s on each side; Tandem balance on beam without UE support and vertical/horizontal head turns x 30s each on each side; Forward/backward gait in hall with vertical and horizontal head turns x multiple bouts; Forward/backward gait in hall with ball toss from PT  with SPT guarding x multiple bouts, varying height of return; Forward marching gait with contralateral hand to knee x multiple bouts;  Ther-ex  NuStep L2-4 x 10 minutes for BLE strengthening and warm-up during interval history (2 minutes unbilled);  Sit to stand without UE support holding 8# med ball 2 x 10; Total Gym (TG) Level 16 (L16) double leg squats x 15; TG L16 single leg squats x 10 BLE, more difficult on RLE; TG L16 double leg heel raises x 20;    Not performed: Blaze pods, 2 on floor and 2 on 6" step, toe taps 2 x 60s, first set with one color and second set with 3 distracting colors; Blaze pods toe taps in star pattern, 5 pods with 4 distracting colors 2 x 60s; Airex 6" and 12" alternating step taps x 15 each on both sides; Airex feet together horizontal and vertical head turns x 30s each; Airex feet together eyes closed x 30s each; Forward 6" step-ups without UE support x 10 BLE; Rockerboard balance in A/P and R/L orientations eyes open x 30s each direction; Rockerboard eyes open weight shifts A/P and R/L orientations x 30s each; Rockerboard eyes open horizontal and vertical head turns A/P and R/L orientations x 30s each; Tandem gait in // bars without UE support x multiple lengths; Heel/toe raises in // bars without UE support 3s hold x 10 each;  Single leg balance practice holding blue tband on tension x approximately 30-45s on each leg; Airex FT ball passes around body to therapist with return pass on opposite side, therapist varying height of return pass x multiple bouts toward each side; Airex FT ball passes from SPT with PT guarding, varying height of return pass x multiple bouts; Nautilus resisted 50# forward, backward, R lateral, and L lateral x 3 each direction;    PATIENT EDUCATION:  Education details: Pt educated throughout session about proper posture and technique with exercises. Improved exercise technique, movement at target joints, use of target muscles  after min to mod verbal, visual, tactile cues.  Person educated: Patient Education method: Explanation, Demonstration, Verbal cues, and Handouts Education comprehension: verbalized understanding   HOME EXERCISE PROGRAM:  Access Code: [email or web address] URL: [email or web address] Date: 12/12/2023 Prepared by: Ria Comment  Exercises - Standing Tandem Balance with Counter Support  - 1 x daily - 7 x weekly - 3 reps - 30s hold - Standing Tandem Balance with Counter Support (Mirrored)  - 1 x daily - 7 x weekly - 3 reps - 30s hold - Tandem Stance with Head Rotation  - 1 x daily - 7 x weekly - 3 reps - 30s hold - Tandem Stance with Head Rotation (Mirrored)  - 1 x daily - 7 x weekly - 3 reps - 30s hold - Tandem Stance with Head Nods   - 1 x daily - 7 x weekly - 3 reps - 30s hold - Tandem Stance with Head Nods  (Mirrored)  - 1 x daily - 7 x weekly - 3 reps - 30s hold - Heel Raise  - 1-2 x daily - 7 x weekly - 2 sets - 10 reps - 3s hold   ASSESSMENT:  CLINICAL IMPRESSION: Progressed balance and strengthening exercises during session today. No HEP  modifications. He requires intermittent seated rest breaks throughout session today but overall endurance is improved. Pt encouraged to follow-up as scheduled. Plan to progress balance and strength exercises at future sessions. He will benefit from PT services to address deficits in strength, balance, and mobility in order to return to full function at home and decrease his risk for falls.    OBJECTIVE IMPAIRMENTS: decreased balance, decreased endurance, decreased strength, and pain.   ACTIVITY LIMITATIONS: standing  PARTICIPATION LIMITATIONS: cleaning, shopping, and community activity  PERSONAL FACTORS: Age, Time since onset of injury/illness/exacerbation, and 3+ comorbidities: OA, OSA, and memory impairments  are also affecting patient's functional outcome.   REHAB POTENTIAL: Good  CLINICAL DECISION MAKING: Evolving/moderate  complexity  EVALUATION COMPLEXITY: Moderate   GOALS: Goals reviewed with patient? No  SHORT TERM GOALS: Target date: 01/15/2024  Pt will be independent with HEP in order to improve strength and balance in order to decrease fall risk and improve function at home. Baseline:  Goal status: INITIAL   LONG TERM GOALS: Target date: 02/26/2024  Pt will increase FOTO to at least 63 to demonstrate significant improvement in function at home related to balance  Baseline: 58 Goal status: INITIAL  2.  Pt will improve single leg balance to >10s on both legs in order to demonstrate clinically significant improvement in balance.   Baseline: 5-10s on each side; Goal status: INITIAL  3.  Pt will improve ABC by at least 13% in order to demonstrate clinically significant improvement in balance confidence.      Baseline: 12/12/23: 68.1% Goal status: DISCONTINUED;  4. Pt will decrease 5TSTS by at least 3 seconds in order to demonstrate clinically significant improvement in LE strength      Baseline: 26.2s Goal status: INITIAL  5. Pt will increase MiniBESTest by at least 4 points in order to demonstrate clinically significant improvement in balance.     Baseline: 12/12/23: 17/28; Goal status: INITIAL   PLAN: PT FREQUENCY: 1-2x/week  PT DURATION: 12 weeks  PLANNED INTERVENTIONS: Therapeutic exercises, Therapeutic activity, Neuromuscular re-education, Balance training, Gait training, Patient/Family education, Self Care, Joint mobilization, Joint manipulation, Vestibular training, Canalith repositioning, Orthotic/Fit training, DME instructions, Dry Needling, Electrical stimulation, Spinal manipulation, Spinal mobilization, Cryotherapy, Moist heat, Taping, Traction, Ultrasound, Ionotophoresis 4mg /ml Dexamethasone, Manual therapy, and Re-evaluation.  PLAN FOR NEXT SESSION: progress balance and strengthening, modify/review HEP as needed;  Sherri Sear, SPT [employer] [facility] Huprich  PT, DPT, GCS  Huprich,Jason 01/10/2024, 9:09 AM

## 2024-01-13 ENCOUNTER — Ambulatory Visit: Payer: Medicare Other

## 2024-01-13 DIAGNOSIS — M6281 Muscle weakness (generalized): Secondary | ICD-10-CM | POA: Diagnosis not present

## 2024-01-13 NOTE — Therapy (Unsigned)
OUTPATIENT PHYSICAL THERAPY BALANCE TREATMENT   Patient Name: Shawn Khan MRN: [id number] DOB:July 30, 1941, 83 y.o., male Today's Date: 01/14/2024  END OF SESSION:  PT End of Session - 01/13/24 1324     Visit Number 8    Number of Visits 25    Date for PT Re-Evaluation 02/26/24    Authorization Type eval: 12/04/23    PT Start Time 1320    PT Stop Time 1405    PT Time Calculation (min) 45 min    Equipment Utilized During Treatment Gait belt    Activity Tolerance Patient tolerated treatment well    Behavior During Therapy WFL for tasks assessed/performed            Past Medical History:  Diagnosis Date   Arthritis    Chronic hoarseness    Complication of anesthesia    h/o nasal intubation x 1 hoarseness x 1 week   Contusion of chest    Cough    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   Gastritis    GERD (gastroesophageal reflux disease)    Hemorrhagic stroke (HCC)    more than 5 years ago   History of chicken pox    Hyperlipidemia    did not like crestor    Hypertension    Hypoglycemia    Hypogonadism in male    Insomnia    Low testosterone in male    Osteoporosis    Prediabetes    A1C 5.9 06/25/18    Rotator cuff disorder    Sleep apnea    cpap   Stroke (HCC)    Vocal cord dysfunction    in 2018 damage per pt    Vocal cord nodule    Zenker diverticulum    1.7 cm    Past Surgical History:  Procedure Laterality Date   APPENDECTOMY     1955   BALLOON DILATION  12/09/2023   Procedure: BALLOON DILATION;  Surgeon: [name], DO;  Location: [facility] ENDOSCOPY;  Service: Gastroenterology;;   BIOPSY  12/09/2023   Procedure: BIOPSY;  Surgeon: [name], DO;  Location: [facility] ENDOSCOPY;  Service: Gastroenterology;;   ESOPHAGEAL DILATION  01/02/2024   Procedure: ESOPHAGEAL DILATION;  Surgeon: [name], DO;  Location: [facility] ENDOSCOPY;  Service: Gastroenterology;;   ESOPHAGOGASTRODUODENOSCOPY N/A 01/02/2024   Procedure:  ESOPHAGOGASTRODUODENOSCOPY (EGD);  Surgeon: [name], DO;  Location: [facility] ENDOSCOPY;  Service: Gastroenterology;  Laterality: N/A;   ESOPHAGOGASTRODUODENOSCOPY (EGD) WITH PROPOFOL N/A 12/09/2023   Procedure: ESOPHAGOGASTRODUODENOSCOPY (EGD) WITH PROPOFOL;  Surgeon: [name], DO;  Location: [facility] ENDOSCOPY;  Service: Gastroenterology;  Laterality: N/A;   EYE SURGERY     b/l cataract   FEMUR FRACTURE SURGERY     FRACTURE SURGERY     IR GUIDED DRAIN W CATHETER PLACEMENT  06/13/2021   IR PERC CHOLECYSTOSTOMY  06/13/2021   LARYNGOSCOPY  09/17/2017   Procedure: LARYNGOSCOPY;  Surgeon: Linus Salmons, MD;  Location: [facility] ORS;  Service: ENT;;   PENILE BIOPSY     04/2018 ulcer with lichenoid inflammation    rcr Bilateral    SHOULDER SURGERY     x3 right and left    THROAT SURGERY     vocal cord nodule   TONSILLECTOMY     1948   Patient Active Problem List   Diagnosis Date Noted   Acute cholecystitis 06/12/2021   Aortic atherosclerosis (HCC) 03/02/2019   Bradycardia 12/18/2018   Leg cramps 12/18/2018   Prediabetes 10/03/2018   Barrett's esophagus 10/03/2018  Penile ulcer 09/16/2018   Zenker's diverticulum 09/16/2018   Degenerative joint disease involving multiple joints on both sides of body 04/23/2018   OSA (obstructive sleep apnea) 04/23/2018   Urinary urgency 04/23/2018   Seborrheic keratosis 04/23/2018   Microscopic hematuria 11/20/2017   Acquired phimosis of penis 11/20/2017   Benign essential hypertension 11/20/2017   Benign prostatic hyperplasia with urinary obstruction 11/20/2017   Chronic hoarseness 11/20/2017   Contusion of chest 11/20/2017   Disorder of bone and articular cartilage 11/20/2017   Disorder of rotator cuff 11/20/2017   Dysphagia 11/20/2017   Gastroesophageal reflux disease 11/20/2017   Generalized osteoarthritis 11/20/2017   History of stroke without residual deficits 11/20/2017   Hypoxemia 11/20/2017   Impaired fasting glucose  11/20/2017   Increased frequency of urination 11/20/2017   Insomnia 11/20/2017   Backache 11/20/2017   Mixed hyperlipidemia 11/20/2017   Obstructive sleep apnea syndrome 11/20/2017   Osteoporosis 11/20/2017   Urinary urgency 11/20/2017   Shoulder joint painful on movement 11/20/2017   Seborrheic keratosis 11/20/2017   Rib pain 11/20/2017   Pharyngitis 11/20/2017   Male hypogonadism 11/20/2017    PCP: Marguarite Arbour, MD  REFERRING PROVIDER: Lonell Face, MD   REFERRING DIAG: R53.81 (ICD-10-CM) - Other malaise   RATIONALE FOR EVALUATION AND TREATMENT: Rehabilitation  THERAPY DIAG: Muscle weakness (generalized)  ONSET DATE: 1 year (approximate)  FOLLOW-UP APPT SCHEDULED WITH REFERRING PROVIDER: Yes   FROM INITIAL EVALUATION SUBJECTIVE:                                                                                                                                                                                         SUBJECTIVE STATEMENT:  Progressive weakness  PERTINENT HISTORY:  Pt reports progressive weakness for the last year as well as issues with endurance. No known cause. History of chronic bilateral knee pain and L shoulder pain s/p surgery. He also complains of bilateral wrist pain. History of CVA "30 years ago." He has some residual RUE numbness from the CVA but otherwise no residual deficits. Pt reports that he struggles with his memory. He has been seeing floaters recently and has an appt with an eye doctor today after his PT evaluation.   10/12/2023 MRI Brain without contrast  IMPRESSION:  1. No evidence of acute intracranial abnormality.  2. Chronic hemorrhage or chronic hemorrhagic infarct within the left  subinsular white matter/basal ganglia.  3. Minimal chronic small-vessel ischemic changes elsewhere within  the cerebral white matter.  4. Mild generalized cerebral atrophy.  Pain: Yes, bilateral knee pain Numbness/Tingling: Yes, RUE Focal Weakness:  No Recent changes in overall health/medication: No Prior history of  physical therapy for balance:  No Dominant hand: right Imaging: Yes  Red flags: Negative for bowel/bladder changes, saddle paresthesia, personal history of cancer, h/o spinal tumors, h/o compression fx, h/o abdominal aneurysm, abdominal pain, chills/fever, night sweats, nausea, vomiting, unrelenting pain, first onset of insidious LBP <19 y/o  PRECAUTIONS: Fall  WEIGHT BEARING RESTRICTIONS: No  FALLS: Has patient fallen in last 6 months? No  Living Environment Lives with: lives with their spouse, daughter and granddaughter next door. Grandson lives 10 minutes away. Lives in: House/apartment, ramp as well as 9 steps with bilateral wide rails Has following equipment at home: Single point cane, Walker - 2 wheeled, Environmental consultant - 4 wheeled, and power. Walk in shower with seat and grab bars  Prior level of function: Independent  Occupational demands: Retired Curator for [employer]: [place], playing games, helping daughter with her Armed forces technical officer business, caring for his chickens  Patient Goals: "Get back to gardening" Pt would like to improve his endurance   OBJECTIVE:   Patient Surveys  FOTO: 58, predicted improvement to 63 ABC: To be completed (given to pt to take home)  Cognition Patient is oriented to person, place, and time.  Recent memory is intact.  Remote memory is intact.  Attention span and concentration are intact.  Expressive speech is intact.  Patient's fund of knowledge is within normal limits for educational level.    Gross Musculoskeletal Assessment Tremor: None Bulk: Normal Tone: Normal  Posture: No gross abnormalities noted in standing or seated posture  AROM Deferred  LE MMT: MMT (out of 5) Right  Left   Hip flexion 4 4  Hip extension    Hip abduction (seated) 4 4  Hip adduction (seated) 4 4  Hip internal rotation    Hip external rotation    Knee flexion  (seated) 5 5  Knee extension 5 5  Ankle dorsiflexion 5 5  Ankle plantarflexion    Ankle inversion    Ankle eversion    (* = pain; Blank rows = not tested)  UE MMT grossly WNL and symmetrical  Sensation Deferred  Reflexes Deferred  Cranial Nerves Deferred  Coordination/Cerebellar Deferred  Bed mobility: Deferred  Transfers: Assistive device utilized: None  Sit to stand: Complete Independence Stand to sit: Complete Independence Chair to chair: Complete Independence Deferred  Curb:  Deferred  Stairs: Level of Assistance: Modified independence Stair Negotiation Technique: Alternating Pattern  with Bilateral Rails Number of Stairs: 4  Height of Stairs: 6"  Comments: No safety concerns noted  Gait: Gait pattern: WFL Distance walked: 100' Assistive device utilized: None Level of assistance: Complete Independence Comments: No gross deficits identified  Functional Outcome Measures  Results Comments  BERG 53/56 Mild deficits  DGI 23/24 WNL  FGA    TUG 10.7 seconds WNL  5TSTS 26.2 seconds Bilateral knee pain  6 Minute Walk Test    10 Meter Gait Speed Self-selected: 9.9 s = 1.01 m/s; Fastest: 7.9 s = 1.27 m/s WNL  (Blank rows = not tested)   TODAY'S TREATMENT    SUBJECTIVE: Pt reports that he is doing well today. Continues with chronic R CMC joint pain. Pt reports residual soreness in the knee joint. He saw rheumatology who referred him to a hand surgeon, and has an appt later this week. No specific questions upon arrival today.   PAIN: Chronic R CMC joint pain;   Neuromuscular Re-education  Tandem static balance on beam without UE support eyes open/closed x 30s on each side; Tandem  balance on beam without UE support and vertical/horizontal head turns x 30s each on each side; Tandem gait on balance beam without UE support x multiple bouts; Airex 6" step taps x 15 each on both sides; Airex feet together horizontal and vertical head turns x 30s  each; Airex feet together eyes closed x 30s each; Forward 6" step-ups without UE support x 10 BLE; Airex FT ball passes from PT with SPT guarding, varying height of return pass x multiple bouts; Tandem gait in // bars picking up cones and placing cones on ground to practice forward reach in small BOS x multiple bouts; Cone stacks high and low on airex pad with tandem stance 2 x 60s each; Blaze pods on wall for overhead and low reaching, lateral steps on airex beam, 6 pods with  5 distracting, 3 x 60s; Toe taps in star pattern 12 o'clock to 6 o'clock and 6 o'clock to 12 o'clock x 3 rounds BLE;   Ther-ex  NuStep L2-4 x 8 minutes for BLE strengthening and warm-up during interval history;  Sit to stand without UE support holding 8# med ball 2 x 10;    Not performed: Total Gym (TG) Level 16 (L16) double leg squats x 15; TG L16 single leg squats x 10 BLE, more difficult on RLE; TG L16 double leg heel raises x 20;  Forward/backward gait in hall with vertical and horizontal head turns x multiple bouts; Forward/backward gait in hall with ball toss from PT with SPT guarding x multiple bouts, varying height of return; Forward marching gait with contralateral hand to knee x multiple bouts; Blaze pods, 2 on floor and 2 on 6" step, toe taps 2 x 60s, first set with one color and second set with 3 distracting colors; Blaze pods toe taps in star pattern, 5 pods with 4 distracting colors 2 x 60s; Rockerboard balance in A/P and R/L orientations eyes open x 30s each direction; Rockerboard eyes open weight shifts A/P and R/L orientations x 30s each; Rockerboard eyes open horizontal and vertical head turns A/P and R/L orientations x 30s each; Tandem gait in // bars without UE support x multiple lengths; Heel/toe raises in // bars without UE support 3s hold x 10 each;  Single leg balance practice holding blue tband on tension x approximately 30-45s on each leg; Airex FT ball passes around body to therapist  with return pass on opposite side, therapist varying height of return pass x multiple bouts toward each side; Nautilus resisted 50# forward, backward, R lateral, and L lateral x 3 each direction;    PATIENT EDUCATION:  Education details: Pt educated throughout session about proper posture and technique with exercises. Improved exercise technique, movement at target joints, use of target muscles after min to mod verbal, visual, tactile cues.  Person educated: Patient Education method: Explanation, Demonstration, Verbal cues, and Handouts Education comprehension: verbalized understanding   HOME EXERCISE PROGRAM:  Access Code: [email or web address] URL: [email or web address] Date: 12/12/2023 Prepared by: Ria Comment  Exercises - Standing Tandem Balance with Counter Support  - 1 x daily - 7 x weekly - 3 reps - 30s hold - Standing Tandem Balance with Counter Support (Mirrored)  - 1 x daily - 7 x weekly - 3 reps - 30s hold - Tandem Stance with Head Rotation  - 1 x daily - 7 x weekly - 3 reps - 30s hold - Tandem Stance with Head Rotation (Mirrored)  - 1 x daily - 7 x weekly - 3 reps - 30s hold -  Tandem Stance with Head Nods   - 1 x daily - 7 x weekly - 3 reps - 30s hold - Tandem Stance with Head Nods  (Mirrored)  - 1 x daily - 7 x weekly - 3 reps - 30s hold - Heel Raise  - 1-2 x daily - 7 x weekly - 2 sets - 10 reps - 3s hold   ASSESSMENT:  CLINICAL IMPRESSION: Progressed balance and strengthening exercises during session today. No HEP modifications. He requires intermittent seated rest breaks throughout session today but overall endurance is improved. Pt encouraged to follow-up as scheduled. Plan to progress balance and strength exercises at future sessions. He will benefit from PT services to address deficits in strength, balance, and mobility in order to return to full function at home and decrease his risk for falls.    OBJECTIVE IMPAIRMENTS: decreased balance, decreased endurance,  decreased strength, and pain.   ACTIVITY LIMITATIONS: standing  PARTICIPATION LIMITATIONS: cleaning, shopping, and community activity  PERSONAL FACTORS: Age, Time since onset of injury/illness/exacerbation, and 3+ comorbidities: OA, OSA, and memory impairments  are also affecting patient's functional outcome.   REHAB POTENTIAL: Good  CLINICAL DECISION MAKING: Evolving/moderate complexity  EVALUATION COMPLEXITY: Moderate   GOALS: Goals reviewed with patient? No  SHORT TERM GOALS: Target date: 01/15/2024  Pt will be independent with HEP in order to improve strength and balance in order to decrease fall risk and improve function at home. Baseline:  Goal status: INITIAL   LONG TERM GOALS: Target date: 02/26/2024  Pt will increase FOTO to at least 63 to demonstrate significant improvement in function at home related to balance  Baseline: 58 Goal status: INITIAL  2.  Pt will improve single leg balance to >10s on both legs in order to demonstrate clinically significant improvement in balance.   Baseline: 5-10s on each side; Goal status: INITIAL  3.  Pt will improve ABC by at least 13% in order to demonstrate clinically significant improvement in balance confidence.      Baseline: 12/12/23: 68.1% Goal status: DISCONTINUED;  4. Pt will decrease 5TSTS by at least 3 seconds in order to demonstrate clinically significant improvement in LE strength      Baseline: 26.2s Goal status: INITIAL  5. Pt will increase MiniBESTest by at least 4 points in order to demonstrate clinically significant improvement in balance.     Baseline: 12/12/23: 17/28; Goal status: INITIAL   PLAN: PT FREQUENCY: 1-2x/week  PT DURATION: 12 weeks  PLANNED INTERVENTIONS: Therapeutic exercises, Therapeutic activity, Neuromuscular re-education, Balance training, Gait training, Patient/Family education, Self Care, Joint mobilization, Joint manipulation, Vestibular training, Canalith repositioning, Orthotic/Fit  training, DME instructions, Dry Needling, Electrical stimulation, Spinal manipulation, Spinal mobilization, Cryotherapy, Moist heat, Taping, Traction, Ultrasound, Ionotophoresis 4mg /ml Dexamethasone, Manual therapy, and Re-evaluation.  PLAN FOR NEXT SESSION: progress balance and strengthening, modify/review HEP as needed;  Sherri Sear, SPT [employer] [facility] Huprich PT, DPT, GCS  Huprich,Jason 01/14/2024, 9:14 AM

## 2024-01-16 ENCOUNTER — Ambulatory Visit: Payer: Medicare Other

## 2024-01-16 DIAGNOSIS — M6281 Muscle weakness (generalized): Secondary | ICD-10-CM | POA: Diagnosis not present

## 2024-01-16 NOTE — Therapy (Signed)
OUTPATIENT PHYSICAL THERAPY BALANCE TREATMENT   Patient Name: Shawn Khan MRN: [id number] DOB:14-Apr-1941, 83 y.o., male Today's Date: 01/16/2024  END OF SESSION:  PT End of Session - 01/16/24 1317     Visit Number 9    Number of Visits 25    Date for PT Re-Evaluation 02/26/24    Authorization Type eval: 12/04/23    PT Start Time 1315    PT Stop Time 1400    PT Time Calculation (min) 45 min    Equipment Utilized During Treatment Gait belt    Activity Tolerance Patient tolerated treatment well    Behavior During Therapy WFL for tasks assessed/performed            Past Medical History:  Diagnosis Date   Arthritis    Chronic hoarseness    Complication of anesthesia    h/o nasal intubation x 1 hoarseness x 1 week   Contusion of chest    Cough    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   Gastritis    GERD (gastroesophageal reflux disease)    Hemorrhagic stroke (HCC)    more than 5 years ago   History of chicken pox    Hyperlipidemia    did not like crestor    Hypertension    Hypoglycemia    Hypogonadism in male    Insomnia    Low testosterone in male    Osteoporosis    Prediabetes    A1C 5.9 06/25/18    Rotator cuff disorder    Sleep apnea    cpap   Stroke (HCC)    Vocal cord dysfunction    in 2018 damage per pt    Vocal cord nodule    Zenker diverticulum    1.7 cm    Past Surgical History:  Procedure Laterality Date   APPENDECTOMY     1955   BALLOON DILATION  12/09/2023   Procedure: BALLOON DILATION;  Surgeon: [name], DO;  Location: [facility] ENDOSCOPY;  Service: Gastroenterology;;   BIOPSY  12/09/2023   Procedure: BIOPSY;  Surgeon: [name], DO;  Location: [facility] ENDOSCOPY;  Service: Gastroenterology;;   ESOPHAGEAL DILATION  01/02/2024   Procedure: ESOPHAGEAL DILATION;  Surgeon: [name], DO;  Location: [facility] ENDOSCOPY;  Service: Gastroenterology;;   ESOPHAGOGASTRODUODENOSCOPY N/A 01/02/2024   Procedure:  ESOPHAGOGASTRODUODENOSCOPY (EGD);  Surgeon: [name], DO;  Location: [facility] ENDOSCOPY;  Service: Gastroenterology;  Laterality: N/A;   ESOPHAGOGASTRODUODENOSCOPY (EGD) WITH PROPOFOL N/A 12/09/2023   Procedure: ESOPHAGOGASTRODUODENOSCOPY (EGD) WITH PROPOFOL;  Surgeon: [name], DO;  Location: [facility] ENDOSCOPY;  Service: Gastroenterology;  Laterality: N/A;   EYE SURGERY     b/l cataract   FEMUR FRACTURE SURGERY     FRACTURE SURGERY     IR GUIDED DRAIN W CATHETER PLACEMENT  06/13/2021   IR PERC CHOLECYSTOSTOMY  06/13/2021   LARYNGOSCOPY  09/17/2017   Procedure: LARYNGOSCOPY;  Surgeon: Linus Salmons, MD;  Location: [facility] ORS;  Service: ENT;;   PENILE BIOPSY     04/2018 ulcer with lichenoid inflammation    rcr Bilateral    SHOULDER SURGERY     x3 right and left    THROAT SURGERY     vocal cord nodule   TONSILLECTOMY     1948   Patient Active Problem List   Diagnosis Date Noted   Acute cholecystitis 06/12/2021   Aortic atherosclerosis (HCC) 03/02/2019   Bradycardia 12/18/2018   Leg cramps 12/18/2018   Prediabetes 10/03/2018   Barrett's esophagus 10/03/2018  Penile ulcer 09/16/2018   Zenker's diverticulum 09/16/2018   Degenerative joint disease involving multiple joints on both sides of body 04/23/2018   OSA (obstructive sleep apnea) 04/23/2018   Urinary urgency 04/23/2018   Seborrheic keratosis 04/23/2018   Microscopic hematuria 11/20/2017   Acquired phimosis of penis 11/20/2017   Benign essential hypertension 11/20/2017   Benign prostatic hyperplasia with urinary obstruction 11/20/2017   Chronic hoarseness 11/20/2017   Contusion of chest 11/20/2017   Disorder of bone and articular cartilage 11/20/2017   Disorder of rotator cuff 11/20/2017   Dysphagia 11/20/2017   Gastroesophageal reflux disease 11/20/2017   Generalized osteoarthritis 11/20/2017   History of stroke without residual deficits 11/20/2017   Hypoxemia 11/20/2017   Impaired fasting glucose  11/20/2017   Increased frequency of urination 11/20/2017   Insomnia 11/20/2017   Backache 11/20/2017   Mixed hyperlipidemia 11/20/2017   Obstructive sleep apnea syndrome 11/20/2017   Osteoporosis 11/20/2017   Urinary urgency 11/20/2017   Shoulder joint painful on movement 11/20/2017   Seborrheic keratosis 11/20/2017   Rib pain 11/20/2017   Pharyngitis 11/20/2017   Male hypogonadism 11/20/2017    PCP: Marguarite Arbour, MD  REFERRING PROVIDER: Lonell Face, MD   REFERRING DIAG: R53.81 (ICD-10-CM) - Other malaise   RATIONALE FOR EVALUATION AND TREATMENT: Rehabilitation  THERAPY DIAG: Muscle weakness (generalized)  ONSET DATE: 1 year (approximate)  FOLLOW-UP APPT SCHEDULED WITH REFERRING PROVIDER: Yes   FROM INITIAL EVALUATION SUBJECTIVE:                                                                                                                                                                                         SUBJECTIVE STATEMENT:  Progressive weakness  PERTINENT HISTORY:  Pt reports progressive weakness for the last year as well as issues with endurance. No known cause. History of chronic bilateral knee pain and L shoulder pain s/p surgery. He also complains of bilateral wrist pain. History of CVA "30 years ago." He has some residual RUE numbness from the CVA but otherwise no residual deficits. Pt reports that he struggles with his memory. He has been seeing floaters recently and has an appt with an eye doctor today after his PT evaluation.   10/12/2023 MRI Brain without contrast  IMPRESSION:  1. No evidence of acute intracranial abnormality.  2. Chronic hemorrhage or chronic hemorrhagic infarct within the left  subinsular white matter/basal ganglia.  3. Minimal chronic small-vessel ischemic changes elsewhere within  the cerebral white matter.  4. Mild generalized cerebral atrophy.  Pain: Yes, bilateral knee pain Numbness/Tingling: Yes, RUE Focal Weakness:  No Recent changes in overall health/medication: No Prior history of  physical therapy for balance:  No Dominant hand: right Imaging: Yes  Red flags: Negative for bowel/bladder changes, saddle paresthesia, personal history of cancer, h/o spinal tumors, h/o compression fx, h/o abdominal aneurysm, abdominal pain, chills/fever, night sweats, nausea, vomiting, unrelenting pain, first onset of insidious LBP <19 y/o  PRECAUTIONS: Fall  WEIGHT BEARING RESTRICTIONS: No  FALLS: Has patient fallen in last 6 months? No  Living Environment Lives with: lives with their spouse, daughter and granddaughter next door. Grandson lives 10 minutes away. Lives in: House/apartment, ramp as well as 9 steps with bilateral wide rails Has following equipment at home: Single point cane, Walker - 2 wheeled, Environmental consultant - 4 wheeled, and power. Walk in shower with seat and grab bars  Prior level of function: Independent  Occupational demands: Retired Curator for [employer]: [place], playing games, helping daughter with her Armed forces technical officer business, caring for his chickens  Patient Goals: "Get back to gardening" Pt would like to improve his endurance   OBJECTIVE:   Patient Surveys  FOTO: 58, predicted improvement to 63 ABC: To be completed (given to pt to take home)  Cognition Patient is oriented to person, place, and time.  Recent memory is intact.  Remote memory is intact.  Attention span and concentration are intact.  Expressive speech is intact.  Patient's fund of knowledge is within normal limits for educational level.    Gross Musculoskeletal Assessment Tremor: None Bulk: Normal Tone: Normal  Posture: No gross abnormalities noted in standing or seated posture  AROM Deferred  LE MMT: MMT (out of 5) Right  Left   Hip flexion 4 4  Hip extension    Hip abduction (seated) 4 4  Hip adduction (seated) 4 4  Hip internal rotation    Hip external rotation    Knee flexion  (seated) 5 5  Knee extension 5 5  Ankle dorsiflexion 5 5  Ankle plantarflexion    Ankle inversion    Ankle eversion    (* = pain; Blank rows = not tested)  UE MMT grossly WNL and symmetrical  Sensation Deferred  Reflexes Deferred  Cranial Nerves Deferred  Coordination/Cerebellar Deferred  Bed mobility: Deferred  Transfers: Assistive device utilized: None  Sit to stand: Complete Independence Stand to sit: Complete Independence Chair to chair: Complete Independence Deferred  Curb:  Deferred  Stairs: Level of Assistance: Modified independence Stair Negotiation Technique: Alternating Pattern  with Bilateral Rails Number of Stairs: 4  Height of Stairs: 6"  Comments: No safety concerns noted  Gait: Gait pattern: WFL Distance walked: 100' Assistive device utilized: None Level of assistance: Complete Independence Comments: No gross deficits identified  Functional Outcome Measures  Results Comments  BERG 53/56 Mild deficits  DGI 23/24 WNL  FGA    TUG 10.7 seconds WNL  5TSTS 26.2 seconds Bilateral knee pain  6 Minute Walk Test    10 Meter Gait Speed Self-selected: 9.9 s = 1.01 m/s; Fastest: 7.9 s = 1.27 m/s WNL  (Blank rows = not tested)   TODAY'S TREATMENT    SUBJECTIVE: Pt reports that he is doing well today. Continues with chronic R CMC joint pain. Pt reports residual soreness in the right knee joint, especially when descending stairs. He saw rheumatology who referred him to a hand surgeon, and he has an appt this week. No specific questions upon arrival today.   PAIN: Chronic R CMC joint pain;   Neuromuscular Re-education  Tandem static balance on beam without UE support eyes open/closed x  30s on each side; Tandem balance on beam without UE support and vertical/horizontal head turns x 30s each on each side; Tandem gait on balance beam without UE support x multiple bouts; Lateral gait on beam without UE support x multiple bouts; Airex 6" step taps x  15 each on both sides; Forward 6" step-ups without UE support x 10 BLE; Rockerboard balance in A/P and R/L orientations eyes open/closed x 30s each direction; Rockerboard eyes open weight shifts A/P and R/L orientations x 30s each; Heel/toe raises in // bars without UE support 3s hold x 10 each;    Ther-ex  NuStep L2-4 x 8 minutes for BLE strengthening and warm-up during interval history;  Sit to stand without UE support holding 8# med ball 2 x 10; Hip strengthening with 5# AW: Hip flexion x 10 BLE; Hip extension x 10 BLE; Hip abduction x 10 BLE; HS curls x 10 BLE;   Not performed: Airex feet together horizontal and vertical head turns x 30s each; Airex feet together eyes closed x 30s each; Airex FT ball passes from PT with SPT guarding, varying height of return pass x multiple bouts; Tandem gait in // bars picking up cones and placing cones on ground to practice forward reach in small BOS x multiple bouts; Cone stacks high and low on airex pad with tandem stance 2 x 60s each; Blaze pods on wall for overhead and low reaching, lateral steps on airex beam, 6 pods with 5 distracting, 3 x 60s; Toe taps in star pattern 12 o'clock to 6 o'clock and 6 o'clock to 12 o'clock x 3 rounds BLE; Total Gym (TG) Level 16 (L16) double leg squats x 15; TG L16 single leg squats x 10 BLE, more difficult on RLE; TG L16 double leg heel raises x 20;  Forward/backward gait in hall with vertical and horizontal head turns x multiple bouts; Forward/backward gait in hall with ball toss from PT with SPT guarding x multiple bouts, varying height of return; Forward marching gait with contralateral hand to knee x multiple bouts; Blaze pods, 2 on floor and 2 on 6" step, toe taps 2 x 60s, first set with one color and second set with 3 distracting colors; Blaze pods toe taps in star pattern, 5 pods with 4 distracting colors 2 x 60s; Rockerboard eyes open horizontal and vertical head turns A/P and R/L orientations x 30s  each; Tandem gait in // bars without UE support x multiple lengths; Single leg balance practice holding blue tband on tension x approximately 30-45s on each leg; Airex FT ball passes around body to therapist with return pass on opposite side, therapist varying height of return pass x multiple bouts toward each side; Nautilus resisted 50# forward, backward, R lateral, and L lateral x 3 each direction;    PATIENT EDUCATION:  Education details: Pt educated throughout session about proper posture and technique with exercises. Improved exercise technique, movement at target joints, use of target muscles after min to mod verbal, visual, tactile cues.  Person educated: Patient Education method: Explanation, Demonstration, Verbal cues, and Handouts Education comprehension: verbalized understanding   HOME EXERCISE PROGRAM:  Access Code: [email or web address] URL: [email or web address] Date: 12/12/2023 Prepared by: Ria Comment  Exercises - Standing Tandem Balance with Counter Support  - 1 x daily - 7 x weekly - 3 reps - 30s hold - Standing Tandem Balance with Counter Support (Mirrored)  - 1 x daily - 7 x weekly - 3 reps - 30s hold - Tandem Stance with Head  Rotation  - 1 x daily - 7 x weekly - 3 reps - 30s hold - Tandem Stance with Head Rotation (Mirrored)  - 1 x daily - 7 x weekly - 3 reps - 30s hold - Tandem Stance with Head Nods   - 1 x daily - 7 x weekly - 3 reps - 30s hold - Tandem Stance with Head Nods  (Mirrored)  - 1 x daily - 7 x weekly - 3 reps - 30s hold - Heel Raise  - 1-2 x daily - 7 x weekly - 2 sets - 10 reps - 3s hold   ASSESSMENT:  CLINICAL IMPRESSION: Progressed balance and strengthening exercises during session today. Noticed some weakness in the glutes and hip during gait so we introduced hip strengthening exercises. No HEP modifications. He requires intermittent seated rest breaks throughout session today but overall endurance is improved. Pt encouraged to follow-up as  scheduled. Plan to progress balance and strength exercises at future sessions. He will benefit from PT services to address deficits in strength, balance, and mobility in order to return to full function at home and decrease his risk for falls.    OBJECTIVE IMPAIRMENTS: decreased balance, decreased endurance, decreased strength, and pain.   ACTIVITY LIMITATIONS: standing  PARTICIPATION LIMITATIONS: cleaning, shopping, and community activity  PERSONAL FACTORS: Age, Time since onset of injury/illness/exacerbation, and 3+ comorbidities: OA, OSA, and memory impairments  are also affecting patient's functional outcome.   REHAB POTENTIAL: Good  CLINICAL DECISION MAKING: Evolving/moderate complexity  EVALUATION COMPLEXITY: Moderate   GOALS: Goals reviewed with patient? No  SHORT TERM GOALS: Target date: 01/15/2024  Pt will be independent with HEP in order to improve strength and balance in order to decrease fall risk and improve function at home. Baseline:  Goal status: INITIAL   LONG TERM GOALS: Target date: 02/26/2024  Pt will increase FOTO to at least 63 to demonstrate significant improvement in function at home related to balance  Baseline: 58 Goal status: INITIAL  2.  Pt will improve single leg balance to >10s on both legs in order to demonstrate clinically significant improvement in balance.   Baseline: 5-10s on each side; Goal status: INITIAL  3.  Pt will improve ABC by at least 13% in order to demonstrate clinically significant improvement in balance confidence.      Baseline: 12/12/23: 68.1% Goal status: DISCONTINUED;  4. Pt will decrease 5TSTS by at least 3 seconds in order to demonstrate clinically significant improvement in LE strength      Baseline: 26.2s Goal status: INITIAL  5. Pt will increase MiniBESTest by at least 4 points in order to demonstrate clinically significant improvement in balance.     Baseline: 12/12/23: 17/28; Goal status: INITIAL   PLAN: PT  FREQUENCY: 1-2x/week  PT DURATION: 12 weeks  PLANNED INTERVENTIONS: Therapeutic exercises, Therapeutic activity, Neuromuscular re-education, Balance training, Gait training, Patient/Family education, Self Care, Joint mobilization, Joint manipulation, Vestibular training, Canalith repositioning, Orthotic/Fit training, DME instructions, Dry Needling, Electrical stimulation, Spinal manipulation, Spinal mobilization, Cryotherapy, Moist heat, Taping, Traction, Ultrasound, Ionotophoresis 4mg /ml Dexamethasone, Manual therapy, and Re-evaluation.  PLAN FOR NEXT SESSION: progress balance and strengthening, modify/review HEP as needed;  Sherri Sear, SPT [employer] [facility] Huprich PT, DPT, GCS  Huprich,Jason 01/16/2024, 5:00 PM

## 2024-01-20 ENCOUNTER — Ambulatory Visit: Payer: Medicare Other | Attending: Neurology

## 2024-01-20 DIAGNOSIS — R2681 Unsteadiness on feet: Secondary | ICD-10-CM | POA: Insufficient documentation

## 2024-01-20 DIAGNOSIS — M6281 Muscle weakness (generalized): Secondary | ICD-10-CM | POA: Insufficient documentation

## 2024-01-22 ENCOUNTER — Ambulatory Visit: Payer: Medicare Other

## 2024-01-22 DIAGNOSIS — M1811 Unilateral primary osteoarthritis of first carpometacarpal joint, right hand: Secondary | ICD-10-CM | POA: Insufficient documentation

## 2024-01-22 DIAGNOSIS — R2681 Unsteadiness on feet: Secondary | ICD-10-CM | POA: Diagnosis present

## 2024-01-22 DIAGNOSIS — M79644 Pain in right finger(s): Secondary | ICD-10-CM | POA: Insufficient documentation

## 2024-01-22 DIAGNOSIS — M6281 Muscle weakness (generalized): Secondary | ICD-10-CM

## 2024-01-22 NOTE — Therapy (Signed)
 OUTPATIENT PHYSICAL THERAPY BALANCE TREATMENT/PROGRESS NOTE  Dates of reporting period  12/04/23   to   01/22/24    Patient Name: Shawn Khan MRN: [id number] DOB:06/01/1941, 83 y.o., male Today's Date: 01/22/2024  END OF SESSION:  PT End of Session - 01/22/24 1149     Visit Number 10    Number of Visits 25    Date for PT Re-Evaluation 02/26/24    Authorization Type eval: 12/04/23    PT Start Time 1150    PT Stop Time 1230    PT Time Calculation (min) 40 min    Equipment Utilized During Treatment Gait belt    Activity Tolerance Patient tolerated treatment well    Behavior During Therapy WFL for tasks assessed/performed            Past Medical History:  Diagnosis Date   Arthritis    Chronic hoarseness    Complication of anesthesia    h/o nasal intubation x 1 hoarseness x 1 week   Contusion of chest    Cough    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   Gastritis    GERD (gastroesophageal reflux disease)    Hemorrhagic stroke (HCC)    more than 5 years ago   History of chicken pox    Hyperlipidemia    did not like crestor    Hypertension    Hypoglycemia    Hypogonadism in male    Insomnia    Low testosterone  in male    Osteoporosis    Prediabetes    A1C 5.9 06/25/18    Rotator cuff disorder    Sleep apnea    cpap   Stroke (HCC)    Vocal cord dysfunction    in 2018 damage per pt    Vocal cord nodule    Zenker diverticulum    1.7 cm    Past Surgical History:  Procedure Laterality Date   APPENDECTOMY     1955   BALLOON DILATION  12/09/2023   Procedure: BALLOON DILATION;  Surgeon: Onita Elspeth Sharper, DO;  Location: [facility] ENDOSCOPY;  Service: Gastroenterology;;   BIOPSY  12/09/2023   Procedure: BIOPSY;  Surgeon: Onita Elspeth Sharper, DO;  Location: [facility] ENDOSCOPY;  Service: Gastroenterology;;   ESOPHAGEAL DILATION  01/02/2024   Procedure: ESOPHAGEAL DILATION;  Surgeon: Onita Elspeth Sharper, DO;  Location: [facility] ENDOSCOPY;  Service: Gastroenterology;;    ESOPHAGOGASTRODUODENOSCOPY N/A 01/02/2024   Procedure: ESOPHAGOGASTRODUODENOSCOPY (EGD);  Surgeon: Onita Elspeth Sharper, DO;  Location: [facility] ENDOSCOPY;  Service: Gastroenterology;  Laterality: N/A;   ESOPHAGOGASTRODUODENOSCOPY (EGD) WITH PROPOFOL  N/A 12/09/2023   Procedure: ESOPHAGOGASTRODUODENOSCOPY (EGD) WITH PROPOFOL ;  Surgeon: Onita Elspeth Sharper, DO;  Location: [facility] ENDOSCOPY;  Service: Gastroenterology;  Laterality: N/A;   EYE SURGERY     b/l cataract   FEMUR FRACTURE SURGERY     FRACTURE SURGERY     IR GUIDED DRAIN W CATHETER PLACEMENT  06/13/2021   IR PERC CHOLECYSTOSTOMY  06/13/2021   LARYNGOSCOPY  09/17/2017   Procedure: LARYNGOSCOPY;  Surgeon: Herminio Miu, MD;  Location: [facility] ORS;  Service: ENT;;   PENILE BIOPSY     04/2018 ulcer with lichenoid inflammation    rcr Bilateral    SHOULDER SURGERY     x3 right and left    THROAT SURGERY     vocal cord nodule   TONSILLECTOMY     1948   Patient Active Problem List   Diagnosis Date Noted   Acute cholecystitis 06/12/2021   Aortic atherosclerosis (HCC) 03/02/2019   Bradycardia 12/18/2018  Leg cramps 12/18/2018   Prediabetes 10/03/2018   Barrett's esophagus 10/03/2018   Penile ulcer 09/16/2018   Zenker's diverticulum 09/16/2018   Degenerative joint disease involving multiple joints on both sides of body 04/23/2018   OSA (obstructive sleep apnea) 04/23/2018   Urinary urgency 04/23/2018   Seborrheic keratosis 04/23/2018   Microscopic hematuria 11/20/2017   Acquired phimosis of penis 11/20/2017   Benign essential hypertension 11/20/2017   Benign prostatic hyperplasia with urinary obstruction 11/20/2017   Chronic hoarseness 11/20/2017   Contusion of chest 11/20/2017   Disorder of bone and articular cartilage 11/20/2017   Disorder of rotator cuff 11/20/2017   Dysphagia 11/20/2017   Gastroesophageal reflux disease 11/20/2017   Generalized osteoarthritis 11/20/2017   History of stroke without residual deficits  11/20/2017   Hypoxemia 11/20/2017   Impaired fasting glucose 11/20/2017   Increased frequency of urination 11/20/2017   Insomnia 11/20/2017   Backache 11/20/2017   Mixed hyperlipidemia 11/20/2017   Obstructive sleep apnea syndrome 11/20/2017   Osteoporosis 11/20/2017   Urinary urgency 11/20/2017   Shoulder joint painful on movement 11/20/2017   Seborrheic keratosis 11/20/2017   Rib pain 11/20/2017   Pharyngitis 11/20/2017   Male hypogonadism 11/20/2017    PCP: Auston Reyes BIRCH, MD  REFERRING PROVIDER: Maree Jannett POUR, MD   REFERRING DIAG: R53.81 (ICD-10-CM) - Other malaise   RATIONALE FOR EVALUATION AND TREATMENT: Rehabilitation  THERAPY DIAG: Muscle weakness (generalized)  ONSET DATE: 1 year (approximate)  FOLLOW-UP APPT SCHEDULED WITH REFERRING PROVIDER: Yes   FROM INITIAL EVALUATION SUBJECTIVE:                                                                                                                                                                                         SUBJECTIVE STATEMENT:  Progressive weakness  PERTINENT HISTORY:  Pt reports progressive weakness for the last year as well as issues with endurance. No known cause. History of chronic bilateral knee pain and L shoulder pain s/p surgery. He also complains of bilateral wrist pain. History of CVA 30 years ago. He has some residual RUE numbness from the CVA but otherwise no residual deficits. Pt reports that he struggles with his memory. He has been seeing floaters recently and has an appt with an eye doctor today after his PT evaluation.   10/12/2023 MRI Brain without contrast  IMPRESSION:  1. No evidence of acute intracranial abnormality.  2. Chronic hemorrhage or chronic hemorrhagic infarct within the left  subinsular white matter/basal ganglia.  3. Minimal chronic small-vessel ischemic changes elsewhere within  the cerebral white matter.  4. Mild generalized cerebral atrophy.  Pain: Yes,  bilateral knee pain Numbness/Tingling:  Yes, RUE Focal Weakness: No Recent changes in overall health/medication: No Prior history of physical therapy for balance:  No Dominant hand: right Imaging: Yes  Red flags: Negative for bowel/bladder changes, saddle paresthesia, personal history of cancer, h/o spinal tumors, h/o compression fx, h/o abdominal aneurysm, abdominal pain, chills/fever, night sweats, nausea, vomiting, unrelenting pain, first onset of insidious LBP <19 y/o  PRECAUTIONS: Fall  WEIGHT BEARING RESTRICTIONS: No  FALLS: Has patient fallen in last 6 months? No  Living Environment Lives with: lives with their spouse, daughter and granddaughter next door. Grandson lives 10 minutes away. Lives in: House/apartment, ramp as well as 9 steps with bilateral wide rails Has following equipment at home: Single point cane, Walker - 2 wheeled, Environmental Consultant - 4 wheeled, and power. Walk in shower with seat and grab bars  Prior level of function: Independent  Occupational demands: Retired curator for [employer]: [place], playing games, helping daughter with her armed forces technical officer business, caring for his chickens  Patient Goals: Get back to gardening Pt would like to improve his endurance   OBJECTIVE:   Patient Surveys  FOTO: 58, predicted improvement to 89 ABC: To be completed (given to pt to take home)  Cognition Patient is oriented to person, place, and time.  Recent memory is intact.  Remote memory is intact.  Attention span and concentration are intact.  Expressive speech is intact.  Patient's fund of knowledge is within normal limits for educational level.    Gross Musculoskeletal Assessment Tremor: None Bulk: Normal Tone: Normal  Posture: No gross abnormalities noted in standing or seated posture  AROM Deferred  LE MMT: MMT (out of 5) Right  Left   Hip flexion 4 4  Hip extension    Hip abduction (seated) 4 4  Hip adduction (seated) 4 4  Hip  internal rotation    Hip external rotation    Knee flexion (seated) 5 5  Knee extension 5 5  Ankle dorsiflexion 5 5  Ankle plantarflexion    Ankle inversion    Ankle eversion    (* = pain; Blank rows = not tested)  UE MMT grossly WNL and symmetrical  Sensation Deferred  Reflexes Deferred  Cranial Nerves Deferred  Coordination/Cerebellar Deferred  Bed mobility: Deferred  Transfers: Assistive device utilized: None  Sit to stand: Complete Independence Stand to sit: Complete Independence Chair to chair: Complete Independence Deferred  Curb:  Deferred  Stairs: Level of Assistance: Modified independence Stair Negotiation Technique: Alternating Pattern  with Bilateral Rails Number of Stairs: 4  Height of Stairs: 6  Comments: No safety concerns noted  Gait: Gait pattern: WFL Distance walked: 100' Assistive device utilized: None Level of assistance: Complete Independence Comments: No gross deficits identified  Functional Outcome Measures  Results Comments  BERG 53/56 Mild deficits  DGI 23/24 WNL  FGA    TUG 10.7 seconds WNL  5TSTS 26.2 seconds Bilateral knee pain  6 Minute Walk Test    10 Meter Gait Speed Self-selected: 9.9 s = 1.01 m/s; Fastest: 7.9 s = 1.27 m/s WNL  (Blank rows = not tested)   TODAY'S TREATMENT    SUBJECTIVE: Pt reports that he is doing well today. Continues with chronic R CMC joint pain. He has an appointment with the hand surgeon later this afternoon. No specific questions upon arrival today.   PAIN: Chronic R CMC joint pain;   Neuromuscular Re-education  Updated outcome measures: FOTO: 54 ABC: 68.1% 5TSTS: 15.4s MiniBESTest: 25/28 Single leg balance:  L:  18s, R: 11s  Tandem static balance without UE support alternating forward LE x 60s on each side; Tandem gait without UE support x multiple bouts; Lateral gait on Airex beam without UE support x multiple bouts; Airex feet together horizontal and vertical head turns x 30s  each; Airex feet together eyes closed x 30s each;    Not performed: Airex FT ball passes from PT with SPT guarding, varying height of return pass x multiple bouts; Tandem gait in // bars picking up cones and placing cones on ground to practice forward reach in small BOS x multiple bouts; Cone stacks high and low on airex pad with tandem stance 2 x 60s each; Blaze pods on wall for overhead and low reaching, lateral steps on airex beam, 6 pods with 5 distracting, 3 x 60s; Toe taps in star pattern 12 o'clock to 6 o'clock and 6 o'clock to 12 o'clock x 3 rounds BLE; Total Gym (TG) Level 16 (L16) double leg squats x 15; TG L16 single leg squats x 10 BLE, more difficult on RLE; TG L16 double leg heel raises x 20;  Forward/backward gait in hall with vertical and horizontal head turns x multiple bouts; Forward/backward gait in hall with ball toss from PT with SPT guarding x multiple bouts, varying height of return; Forward marching gait with contralateral hand to knee x multiple bouts; Blaze pods, 2 on floor and 2 on 6 step, toe taps 2 x 60s, first set with one color and second set with 3 distracting colors; Blaze pods toe taps in star pattern, 5 pods with 4 distracting colors 2 x 60s; Rockerboard eyes open horizontal and vertical head turns A/P and R/L orientations x 30s each; Airex FT ball passes around body to therapist with return pass on opposite side, therapist varying height of return pass x multiple bouts toward each side; Nautilus resisted 50# forward, backward, R lateral, and L lateral x 3 each direction;    PATIENT EDUCATION:  Education details: Outcome measures and balance exercises; Person educated: Patient Education method: Explanation, Demonstration, and Verbal cues Education comprehension: verbalized understanding   HOME EXERCISE PROGRAM:  Access Code: [email or web address] URL: [email or web address] Date: 12/12/2023 Prepared by: Selinda Eck  Exercises - Standing  Tandem Balance with Counter Support  - 1 x daily - 7 x weekly - 3 reps - 30s hold - Standing Tandem Balance with Counter Support (Mirrored)  - 1 x daily - 7 x weekly - 3 reps - 30s hold - Tandem Stance with Head Rotation  - 1 x daily - 7 x weekly - 3 reps - 30s hold - Tandem Stance with Head Rotation (Mirrored)  - 1 x daily - 7 x weekly - 3 reps - 30s hold - Tandem Stance with Head Nods   - 1 x daily - 7 x weekly - 3 reps - 30s hold - Tandem Stance with Head Nods  (Mirrored)  - 1 x daily - 7 x weekly - 3 reps - 30s hold - Heel Raise  - 1-2 x daily - 7 x weekly - 2 sets - 10 reps - 3s hold   ASSESSMENT:  CLINICAL IMPRESSION: Updated outcome measures with patient during session today. Significant improvement in single leg balance, 5TSTS, and MiniBESTest. After updating outcome measures progressed balance exercises. No HEP modifications. Pt encouraged to follow-up as scheduled. Plan to progress balance and strength exercises at future sessions. He will benefit from PT services to address deficits in strength, balance, and mobility in order to return  to full function at home and decrease his risk for falls.    OBJECTIVE IMPAIRMENTS: decreased balance, decreased endurance, decreased strength, and pain.   ACTIVITY LIMITATIONS: standing  PARTICIPATION LIMITATIONS: cleaning, shopping, and community activity  PERSONAL FACTORS: Age, Time since onset of injury/illness/exacerbation, and 3+ comorbidities: OA, OSA, and memory impairments  are also affecting patient's functional outcome.   REHAB POTENTIAL: Good  CLINICAL DECISION MAKING: Evolving/moderate complexity  EVALUATION COMPLEXITY: Moderate   GOALS: Goals reviewed with patient? No  SHORT TERM GOALS: Target date: 01/15/2024  Pt will be independent with HEP in order to improve strength and balance in order to decrease fall risk and improve function at home. Baseline:  Goal status: ONGOING   LONG TERM GOALS: Target date: 02/26/2024  Pt  will increase FOTO to at least 63 to demonstrate significant improvement in function at home related to balance  Baseline: 58; 01/22/24: 54;  Goal status: ONGOING  2.  Pt will improve single leg balance to >10s on both legs in order to demonstrate clinically significant improvement in balance.   Baseline: 5-10s on each side; 01/22/24: L: 18s, R: 11s Goal status: ACHIEVED  3.  Pt will improve ABC by at least 13% in order to demonstrate clinically significant improvement in balance confidence.      Baseline: 12/12/23: 68.1%; 01/22/24: 68.1% Goal status: DISCONTINUED;  4. Pt will decrease 5TSTS to less than 14 seconds in order to demonstrate clinically significant improvement in LE strength      Baseline: 26.2s; 01/22/24: 15.4s; Goal status: REVISED  5. Pt will increase MiniBESTest by at least 4 points in order to demonstrate clinically significant improvement in balance.     Baseline: 12/12/23: 17/28; 01/22/24: 25/28; Goal status: ACHIEVED   PLAN: PT FREQUENCY: 1-2x/week  PT DURATION: 12 weeks  PLANNED INTERVENTIONS: Therapeutic exercises, Therapeutic activity, Neuromuscular re-education, Balance training, Gait training, Patient/Family education, Self Care, Joint mobilization, Joint manipulation, Vestibular training, Canalith repositioning, Orthotic/Fit training, DME instructions, Dry Needling, Electrical stimulation, Spinal manipulation, Spinal mobilization, Cryotherapy, Moist heat, Taping, Traction, Ultrasound, Ionotophoresis 4mg /ml Dexamethasone , Manual therapy, and Re-evaluation.  PLAN FOR NEXT SESSION: progress balance and strengthening, modify/review HEP as needed;   Arriyana Rodell D Onnie Alatorre PT, DPT, GCS  Quinton Voth 01/22/2024, 9:48 PM

## 2024-01-27 ENCOUNTER — Ambulatory Visit: Payer: Medicare Other

## 2024-01-27 DIAGNOSIS — M6281 Muscle weakness (generalized): Secondary | ICD-10-CM

## 2024-01-27 NOTE — Therapy (Signed)
 OUTPATIENT PHYSICAL THERAPY BALANCE TREATMENT   Patient Name: Shawn Khan MRN: [id number] DOB:Aug 30, 1941, 83 y.o., male Today's Date: 01/27/2024  END OF SESSION:  PT End of Session - 01/27/24 1430     Visit Number 11    Number of Visits 25    Date for PT Re-Evaluation 02/26/24    Authorization Type eval: 12/04/23    PT Start Time 1445    PT Stop Time 1530    PT Time Calculation (min) 45 min    Equipment Utilized During Treatment Gait belt    Activity Tolerance Patient tolerated treatment well    Behavior During Therapy WFL for tasks assessed/performed            Past Medical History:  Diagnosis Date   Arthritis    Chronic hoarseness    Complication of anesthesia    h/o nasal intubation x 1 hoarseness x 1 week   Contusion of chest    Cough    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   Gastritis    GERD (gastroesophageal reflux disease)    Hemorrhagic stroke (HCC)    more than 5 years ago   History of chicken pox    Hyperlipidemia    did not like crestor    Hypertension    Hypoglycemia    Hypogonadism in male    Insomnia    Low testosterone  in male    Osteoporosis    Prediabetes    A1C 5.9 06/25/18    Rotator cuff disorder    Sleep apnea    cpap   Stroke (HCC)    Vocal cord dysfunction    in 2018 damage per pt    Vocal cord nodule    Zenker diverticulum    1.7 cm    Past Surgical History:  Procedure Laterality Date   APPENDECTOMY     1955   BALLOON DILATION  12/09/2023   Procedure: BALLOON DILATION;  Surgeon: Quintin Buckle, DO;  Location: [facility] ENDOSCOPY;  Service: Gastroenterology;;   BIOPSY  12/09/2023   Procedure: BIOPSY;  Surgeon: Quintin Buckle, DO;  Location: [facility] ENDOSCOPY;  Service: Gastroenterology;;   ESOPHAGEAL DILATION  01/02/2024   Procedure: ESOPHAGEAL DILATION;  Surgeon: Quintin Buckle, DO;  Location[facility] ENDOSCOPY;  Service: Gastroenterology;;   ESOPHAGOGASTRODUODENOSCOPY N/A 01/02/2024   Procedure:  ESOPHAGOGASTRODUODENOSCOPY (EGD);  Surgeon: Quintin Buckle, DO;  Location: [facility] ENDOSCOPY;  Service: Gastroenterology;  Laterality: N/A;   ESOPHAGOGASTRODUODENOSCOPY (EGD) WITH PROPOFOL  N/A 12/09/2023   Procedure: ESOPHAGOGASTRODUODENOSCOPY (EGD) WITH PROPOFOL ;  Surgeon: Quintin Buckle, DO;  Location: [facility] ENDOSCOPY;  Service: Gastroenterology;  Laterality: N/A;   EYE SURGERY     b/l cataract   FEMUR FRACTURE SURGERY     FRACTURE SURGERY     IR GUIDED DRAIN W CATHETER PLACEMENT  06/13/2021   IR PERC CHOLECYSTOSTOMY  06/13/2021   LARYNGOSCOPY  09/17/2017   Procedure: LARYNGOSCOPY;  Surgeon: Lesly Raspberry, MD;  Location: [facility] ORS;  Service: ENT;;   PENILE BIOPSY     04/2018 ulcer with lichenoid inflammation    rcr Bilateral    SHOULDER SURGERY     x3 right and left    THROAT SURGERY     vocal cord nodule   TONSILLECTOMY     1948   Patient Active Problem List   Diagnosis Date Noted   Acute cholecystitis 06/12/2021   Aortic atherosclerosis (HCC) 03/02/2019   Bradycardia 12/18/2018   Leg cramps 12/18/2018   Prediabetes 10/03/2018   Barrett's esophagus 10/03/2018  Penile ulcer 09/16/2018   Zenker's diverticulum 09/16/2018   Degenerative joint disease involving multiple joints on both sides of body 04/23/2018   OSA (obstructive sleep apnea) 04/23/2018   Urinary urgency 04/23/2018   Seborrheic keratosis 04/23/2018   Microscopic hematuria 11/20/2017   Acquired phimosis of penis 11/20/2017   Benign essential hypertension 11/20/2017   Benign prostatic hyperplasia with urinary obstruction 11/20/2017   Chronic hoarseness 11/20/2017   Contusion of chest 11/20/2017   Disorder of bone and articular cartilage 11/20/2017   Disorder of rotator cuff 11/20/2017   Dysphagia 11/20/2017   Gastroesophageal reflux disease 11/20/2017   Generalized osteoarthritis 11/20/2017   History of stroke without residual deficits 11/20/2017   Hypoxemia 11/20/2017   Impaired fasting glucose  11/20/2017   Increased frequency of urination 11/20/2017   Insomnia 11/20/2017   Backache 11/20/2017   Mixed hyperlipidemia 11/20/2017   Obstructive sleep apnea syndrome 11/20/2017   Osteoporosis 11/20/2017   Urinary urgency 11/20/2017   Shoulder joint painful on movement 11/20/2017   Seborrheic keratosis 11/20/2017   Rib pain 11/20/2017   Pharyngitis 11/20/2017   Male hypogonadism 11/20/2017    PCP: Yehuda Helms, MD  REFERRING PROVIDER: Rosan Comfort, MD   REFERRING DIAG: R53.81 (ICD-10-CM) - Other malaise   RATIONALE FOR EVALUATION AND TREATMENT: Rehabilitation  THERAPY DIAG: Muscle weakness (generalized)  ONSET DATE: 1 year (approximate)  FOLLOW-UP APPT SCHEDULED WITH REFERRING PROVIDER: Yes   FROM INITIAL EVALUATION SUBJECTIVE:                                                                                                                                                                                         SUBJECTIVE STATEMENT:  Progressive weakness  PERTINENT HISTORY:  Pt reports progressive weakness for the last year as well as issues with endurance. No known cause. History of chronic bilateral knee pain and L shoulder pain s/p surgery. He also complains of bilateral wrist pain. History of CVA "30 years ago." He has some residual RUE numbness from the CVA but otherwise no residual deficits. Pt reports that he struggles with his memory. He has been seeing floaters recently and has an appt with an eye doctor today after his PT evaluation.   10/12/2023 MRI Brain without contrast  IMPRESSION:  1. No evidence of acute intracranial abnormality.  2. Chronic hemorrhage or chronic hemorrhagic infarct within the left  subinsular white matter/basal ganglia.  3. Minimal chronic small-vessel ischemic changes elsewhere within  the cerebral white matter.  4. Mild generalized cerebral atrophy.  Pain: Yes, bilateral knee pain Numbness/Tingling: Yes, RUE Focal Weakness:  No Recent changes in overall health/medication: No Prior history of  physical therapy for balance:  No Dominant hand: right Imaging: Yes  Red flags: Negative for bowel/bladder changes, saddle paresthesia, personal history of cancer, h/o spinal tumors, h/o compression fx, h/o abdominal aneurysm, abdominal pain, chills/fever, night sweats, nausea, vomiting, unrelenting pain, first onset of insidious LBP <20 y/o  PRECAUTIONS: Fall  WEIGHT BEARING RESTRICTIONS: No  FALLS: Has patient fallen in last 6 months? No  Living Environment Lives with: lives with their spouse, daughter and granddaughter next door. Grandson lives 10 minutes away. Lives in: House/apartment, ramp as well as 9 steps with bilateral wide rails Has following equipment at home: Single point cane, Walker - 2 wheeled, Environmental consultant - 4 wheeled, and power. Walk in shower with seat and grab bars  Prior level of function: Independent  Occupational demands: Retired Curator for [employer]: [place], playing games, helping daughter with her Armed forces technical officer business, caring for his chickens  Patient Goals: "Get back to gardening" Pt would like to improve his endurance   OBJECTIVE:   Patient Surveys  FOTO: 58, predicted improvement to 63 ABC: To be completed (given to pt to take home)   Updated outcome measures 01/22/24: FOTO: 54 ABC: 68.1% 5TSTS: 15.4s MiniBESTest: 25/28 Single leg balance:  L: 18s, R: 11s  Cognition Patient is oriented to person, place, and time.  Recent memory is intact.  Remote memory is intact.  Attention span and concentration are intact.  Expressive speech is intact.  Patient's fund of knowledge is within normal limits for educational level.    Gross Musculoskeletal Assessment Tremor: None Bulk: Normal Tone: Normal  Posture: No gross abnormalities noted in standing or seated posture  AROM Deferred  LE MMT: MMT (out of 5) Right  Left   Hip flexion 4 4  Hip extension     Hip abduction (seated) 4 4  Hip adduction (seated) 4 4  Hip internal rotation    Hip external rotation    Knee flexion (seated) 5 5  Knee extension 5 5  Ankle dorsiflexion 5 5  Ankle plantarflexion    Ankle inversion    Ankle eversion    (* = pain; Blank rows = not tested)  UE MMT grossly WNL and symmetrical  Sensation Deferred  Reflexes Deferred  Cranial Nerves Deferred  Coordination/Cerebellar Deferred  Bed mobility: Deferred  Transfers: Assistive device utilized: None  Sit to stand: Complete Independence Stand to sit: Complete Independence Chair to chair: Complete Independence Deferred  Curb:  Deferred  Stairs: Level of Assistance: Modified independence Stair Negotiation Technique: Alternating Pattern  with Bilateral Rails Number of Stairs: 4  Height of Stairs: 6"  Comments: No safety concerns noted  Gait: Gait pattern: WFL Distance walked: 100' Assistive device utilized: None Level of assistance: Complete Independence Comments: No gross deficits identified  Functional Outcome Measures  Results Comments  BERG 53/56 Mild deficits  DGI 23/24 WNL  FGA    TUG 10.7 seconds WNL  5TSTS 26.2 seconds Bilateral knee pain  6 Minute Walk Test    10 Meter Gait Speed Self-selected: 9.9 s = 1.01 m/s; Fastest: 7.9 s = 1.27 m/s WNL  (Blank rows = not tested)   TODAY'S TREATMENT   SUBJECTIVE: Pt reports that he is doing well today. Continues with chronic R CMC joint pain. He had an appt with the hand surgeon in which they injected his thumbs bilaterally. Pt continues to have soreness from the injection. No specific questions upon arrival today.  PAIN: Chronic R CMC joint pain;  Neuromuscular Re-education  NuStep L1-4 x 10 minutes for warm-up and BLE strengtheing during interval history;  6" toe taps x 15 BLE; 6" step ups x 15 BLE; Tandem gait picking up cones and placing cones on ground to practice forward reach in small BOS x multiple bouts; Forward  marching gait with contralateral hand to knee x multiple bouts; Forward/backward gait in hall with vertical and horizontal head turns x multiple bouts; 6" and 12" hurdles on ladder with no UE support x multiple lengths; Blaze pods in square (4 pods, random) with forward/retro gait 3 x 90s;   Not performed: Tandem static balance without UE support alternating forward LE x 60s on each side; Tandem gait without UE support x multiple bouts; Lateral gait on Airex beam without UE support x multiple bouts; Airex feet together horizontal and vertical head turns x 30s each; Airex feet together eyes closed x 30s each; Airex FT ball passes from PT with SPT guarding, varying height of return pass x multiple bouts; Cone stacks high and low on airex pad with tandem stance 2 x 60s each; Blaze pods on wall for overhead and low reaching, lateral steps on airex beam, 6 pods with 5 distracting, 3 x 60s; Toe taps in star pattern 12 o'clock to 6 o'clock and 6 o'clock to 12 o'clock x 3 rounds BLE; Total Gym (TG) Level 16 (L16) double leg squats x 15; TG L16 single leg squats x 10 BLE, more difficult on RLE; TG L16 double leg heel raises x 20;  Forward/backward gait in hall with ball toss from PT with SPT guarding x multiple bouts, varying height of return; Blaze pods, 2 on floor and 2 on 6" step, toe taps 2 x 60s, first set with one color and second set with 3 distracting colors; Blaze pods toe taps in star pattern, 5 pods with 4 distracting colors 2 x 60s; Rockerboard eyes open horizontal and vertical head turns A/P and R/L orientations x 30s each; Airex FT ball passes around body to therapist with return pass on opposite side, therapist varying height of return pass x multiple bouts toward each side; Nautilus resisted 50# forward, backward, R lateral, and L lateral x 3 each direction;    PATIENT EDUCATION:  Education details: Outcome measures and balance exercises; Person educated: Patient Education  method: Explanation, Demonstration, and Verbal cues Education comprehension: verbalized understanding   HOME EXERCISE PROGRAM:  Access Code: [email or web address] URL: [email or web address] Date: 12/12/2023 Prepared by: Crawford Dock  Exercises - Standing Tandem Balance with Counter Support  - 1 x daily - 7 x weekly - 3 reps - 30s hold - Standing Tandem Balance with Counter Support (Mirrored)  - 1 x daily - 7 x weekly - 3 reps - 30s hold - Tandem Stance with Head Rotation  - 1 x daily - 7 x weekly - 3 reps - 30s hold - Tandem Stance with Head Rotation (Mirrored)  - 1 x daily - 7 x weekly - 3 reps - 30s hold - Tandem Stance with Head Nods   - 1 x daily - 7 x weekly - 3 reps - 30s hold - Tandem Stance with Head Nods  (Mirrored)  - 1 x daily - 7 x weekly - 3 reps - 30s hold - Heel Raise  - 1-2 x daily - 7 x weekly - 2 sets - 10 reps - 3s hold   ASSESSMENT:  CLINICAL IMPRESSION: Progressed balance exercises in today's session with single leg balance and utilizing blaze pods. No HEP modifications.  Pt encouraged to follow-up as scheduled. Plan to progress balance and strength exercises at future sessions. He will benefit from PT services to address deficits in strength, balance, and mobility in order to return to full function at home and decrease his risk for falls.    OBJECTIVE IMPAIRMENTS: decreased balance, decreased endurance, decreased strength, and pain.   ACTIVITY LIMITATIONS: standing  PARTICIPATION LIMITATIONS: cleaning, shopping, and community activity  PERSONAL FACTORS: Age, Time since onset of injury/illness/exacerbation, and 3+ comorbidities: OA, OSA, and memory impairments  are also affecting patient's functional outcome.   REHAB POTENTIAL: Good  CLINICAL DECISION MAKING: Evolving/moderate complexity  EVALUATION COMPLEXITY: Moderate   GOALS: Goals reviewed with patient? No  SHORT TERM GOALS: Target date: 01/15/2024  Pt will be independent with HEP in order to  improve strength and balance in order to decrease fall risk and improve function at home. Baseline:  Goal status: ONGOING   LONG TERM GOALS: Target date: 02/26/2024  Pt will increase FOTO to at least 63 to demonstrate significant improvement in function at home related to balance  Baseline: 58; 01/22/24: 54;  Goal status: ONGOING  2.  Pt will improve single leg balance to >10s on both legs in order to demonstrate clinically significant improvement in balance.   Baseline: 5-10s on each side; 01/22/24: L: 18s, R: 11s Goal status: ACHIEVED  3.  Pt will improve ABC by at least 13% in order to demonstrate clinically significant improvement in balance confidence.      Baseline: 12/12/23: 68.1%; 01/22/24: 68.1% Goal status: DISCONTINUED;  4. Pt will decrease 5TSTS to less than 14 seconds in order to demonstrate clinically significant improvement in LE strength      Baseline: 26.2s; 01/22/24: 15.4s; Goal status: REVISED  5. Pt will increase MiniBESTest by at least 4 points in order to demonstrate clinically significant improvement in balance.     Baseline: 12/12/23: 17/28; 01/22/24: 25/28; Goal status: ACHIEVED   PLAN: PT FREQUENCY: 1-2x/week  PT DURATION: 12 weeks  PLANNED INTERVENTIONS: Therapeutic exercises, Therapeutic activity, Neuromuscular re-education, Balance training, Gait training, Patient/Family education, Self Care, Joint mobilization, Joint manipulation, Vestibular training, Canalith repositioning, Orthotic/Fit training, DME instructions, Dry Needling, Electrical stimulation, Spinal manipulation, Spinal mobilization, Cryotherapy, Moist heat, Taping, Traction, Ultrasound, Ionotophoresis 4mg /ml Dexamethasone , Manual therapy, and Re-evaluation.  PLAN FOR NEXT SESSION: progress balance and strengthening, modify/review HEP as needed;  Jaide Hillenburg, SPT [employer] DP[facility]   Jason D Huprich PT, DPT, GCS  Huprich,Jason 01/27/2024, 5:27 PM

## 2024-01-29 ENCOUNTER — Ambulatory Visit: Payer: Medicare Other

## 2024-01-30 ENCOUNTER — Ambulatory Visit: Payer: Medicare Other

## 2024-01-30 DIAGNOSIS — M6281 Muscle weakness (generalized): Secondary | ICD-10-CM | POA: Diagnosis not present

## 2024-01-30 DIAGNOSIS — R2681 Unsteadiness on feet: Secondary | ICD-10-CM

## 2024-01-30 NOTE — Therapy (Unsigned)
OUTPATIENT PHYSICAL THERAPY BALANCE TREATMENT   Patient Name: Shawn Khan MRN: [id number] DOB:[birth year], 83 y.o., male Today's Date: 01/31/2024  END OF SESSION:  PT End of Session - 01/30/24 1038     Visit Number 12    Number of Visits 25    Date for PT Re-Evaluation 02/26/24    Authorization Type eval: 12/04/23    PT Start Time 1040    PT Stop Time 1125    PT Time Calculation (min) 45 min    Equipment Utilized During Treatment Gait belt    Activity Tolerance Patient tolerated treatment well    Behavior During Therapy WFL for tasks assessed/performed            Past Medical History:  Diagnosis Date   Arthritis    Chronic hoarseness    Complication of anesthesia    h/o nasal intubation x 1 hoarseness x 1 week   Contusion of chest    Cough    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   Gastritis    GERD (gastroesophageal reflux disease)    Hemorrhagic stroke (HCC)    more than 5 years ago   History of chicken pox    Hyperlipidemia    did not like crestor    Hypertension    Hypoglycemia    Hypogonadism in male    Insomnia    Low testosterone in male    Osteoporosis    Prediabetes    A1C 5.9 06/25/18    Rotator cuff disorder    Sleep apnea    cpap   Stroke (HCC)    Vocal cord dysfunction    in 2018 damage per pt    Vocal cord nodule    Zenker diverticulum    1.7 cm    Past Surgical History:  Procedure Laterality Date   APPENDECTOMY     1955   BALLOON DILATION  12/09/2023   Procedure: BALLOON DILATION;  Surgeon: [name], DO;  Location: [facility] ENDOSCOPY;  Service: Gastroenterology;;   BIOPSY  12/09/2023   Procedure: BIOPSY;  Surgeon: [name], DO;  Location: [facility] ENDOSCOPY;  Service: Gastroenterology;;   ESOPHAGEAL DILATION  01/02/2024   Procedure: ESOPHAGEAL DILATION;  Surgeon: [name], DO;  Location: [facility] ENDOSCOPY;  Service: Gastroenterology;;   ESOPHAGOGASTRODUODENOSCOPY N/A 01/02/2024   Procedure:  ESOPHAGOGASTRODUODENOSCOPY (EGD);  Surgeon: [name], DO;  Location: [facility] ENDOSCOPY;  Service: Gastroenterology;  Laterality: N/A;   ESOPHAGOGASTRODUODENOSCOPY (EGD) WITH PROPOFOL N/A 12/09/2023   Procedure: ESOPHAGOGASTRODUODENOSCOPY (EGD) WITH PROPOFOL;  Surgeon: [name], DO;  Location: [facility] ENDOSCOPY;  Service: Gastroenterology;  Laterality: N/A;   EYE SURGERY     b/l cataract   FEMUR FRACTURE SURGERY     FRACTURE SURGERY     IR GUIDED DRAIN W CATHETER PLACEMENT  06/13/2021   IR PERC CHOLECYSTOSTOMY  06/13/2021   LARYNGOSCOPY  09/17/2017   Procedure: LARYNGOSCOPY;  Surgeon: Linus Salmons, MD;  Location: [facility] ORS;  Service: ENT;;   PENILE BIOPSY     04/2018 ulcer with lichenoid inflammation    rcr Bilateral    SHOULDER SURGERY     x3 right and left    THROAT SURGERY     vocal cord nodule   TONSILLECTOMY     1948   Patient Active Problem List   Diagnosis Date Noted   Acute cholecystitis 06/12/2021   Aortic atherosclerosis (HCC) 03/02/2019   Bradycardia 12/18/2018   Leg cramps 12/18/2018   Prediabetes 10/03/2018   Barrett's esophagus 10/03/2018  Penile ulcer 09/16/2018   Zenker's diverticulum 09/16/2018   Degenerative joint disease involving multiple joints on both sides of body 04/23/2018   OSA (obstructive sleep apnea) 04/23/2018   Urinary urgency 04/23/2018   Seborrheic keratosis 04/23/2018   Microscopic hematuria 11/20/2017   Acquired phimosis of penis 11/20/2017   Benign essential hypertension 11/20/2017   Benign prostatic hyperplasia with urinary obstruction 11/20/2017   Chronic hoarseness 11/20/2017   Contusion of chest 11/20/2017   Disorder of bone and articular cartilage 11/20/2017   Disorder of rotator cuff 11/20/2017   Dysphagia 11/20/2017   Gastroesophageal reflux disease 11/20/2017   Generalized osteoarthritis 11/20/2017   History of stroke without residual deficits 11/20/2017   Hypoxemia 11/20/2017   Impaired fasting glucose  11/20/2017   Increased frequency of urination 11/20/2017   Insomnia 11/20/2017   Backache 11/20/2017   Mixed hyperlipidemia 11/20/2017   Obstructive sleep apnea syndrome 11/20/2017   Osteoporosis 11/20/2017   Urinary urgency 11/20/2017   Shoulder joint painful on movement 11/20/2017   Seborrheic keratosis 11/20/2017   Rib pain 11/20/2017   Pharyngitis 11/20/2017   Male hypogonadism 11/20/2017    PCP: Shawn Arbour, MD  REFERRING PROVIDER: Lonell Face, MD   REFERRING DIAG: R53.81 (ICD-10-CM) - Other malaise   RATIONALE FOR EVALUATION AND TREATMENT: Rehabilitation  THERAPY DIAG: Muscle weakness (generalized)  Unsteadiness on feet  ONSET DATE: 1 year (approximate)  FOLLOW-UP APPT SCHEDULED WITH REFERRING PROVIDER: Yes   FROM INITIAL EVALUATION SUBJECTIVE:                                                                                                                                                                                         SUBJECTIVE STATEMENT:  Progressive weakness  PERTINENT HISTORY:  Pt reports progressive weakness for the last year as well as issues with endurance. No known cause. History of chronic bilateral knee pain and L shoulder pain s/p surgery. He also complains of bilateral wrist pain. History of CVA "30 years ago." He has some residual RUE numbness from the CVA but otherwise no residual deficits. Pt reports that he struggles with his memory. He has been seeing floaters recently and has an appt with an eye doctor today after his PT evaluation.   10/12/2023 MRI Brain without contrast  IMPRESSION:  1. No evidence of acute intracranial abnormality.  2. Chronic hemorrhage or chronic hemorrhagic infarct within the left  subinsular white matter/basal ganglia.  3. Minimal chronic small-vessel ischemic changes elsewhere within  the cerebral white matter.  4. Mild generalized cerebral atrophy.  Pain: Yes, bilateral knee pain Numbness/Tingling: Yes,  RUE Focal Weakness: No Recent changes in overall health/medication:  No Prior history of physical therapy for balance:  No Dominant hand: right Imaging: Yes  Red flags: Negative for bowel/bladder changes, saddle paresthesia, personal history of cancer, h/o spinal tumors, h/o compression fx, h/o abdominal aneurysm, abdominal pain, chills/fever, night sweats, nausea, vomiting, unrelenting pain, first onset of insidious LBP <19 y/o  PRECAUTIONS: Fall  WEIGHT BEARING RESTRICTIONS: No  FALLS: Has patient fallen in last 6 months? No  Living Environment Lives with: lives with their spouse, daughter and granddaughter next door. Grandson lives 10 minutes away. Lives in: House/apartment, ramp as well as 9 steps with bilateral wide rails Has following equipment at home: Single point cane, Walker - 2 wheeled, Environmental consultant - 4 wheeled, and power. Walk in shower with seat and grab bars  Prior level of function: Independent  Occupational demands: Retired Curator for [employer]: [place], playing games, helping daughter with her Armed forces technical officer business, caring for his chickens  Patient Goals: "Get back to gardening" Pt would like to improve his endurance   OBJECTIVE:   Patient Surveys  FOTO: 58, predicted improvement to 63 ABC: To be completed (given to pt to take home)   Updated outcome measures 01/22/24: FOTO: 54 ABC: 68.1% 5TSTS: 15.4s MiniBESTest: 25/28 Single leg balance:  L: 18s, R: 11s  Cognition Patient is oriented to person, place, and time.  Recent memory is intact.  Remote memory is intact.  Attention span and concentration are intact.  Expressive speech is intact.  Patient's fund of knowledge is within normal limits for educational level.    Gross Musculoskeletal Assessment Tremor: None Bulk: Normal Tone: Normal  Posture: No gross abnormalities noted in standing or seated posture  AROM Deferred  LE MMT: MMT (out of 5) Right  Left   Hip flexion  4 4  Hip extension    Hip abduction (seated) 4 4  Hip adduction (seated) 4 4  Hip internal rotation    Hip external rotation    Knee flexion (seated) 5 5  Knee extension 5 5  Ankle dorsiflexion 5 5  Ankle plantarflexion    Ankle inversion    Ankle eversion    (* = pain; Blank rows = not tested)  UE MMT grossly WNL and symmetrical  Sensation Deferred  Reflexes Deferred  Cranial Nerves Deferred  Coordination/Cerebellar Deferred  Bed mobility: Deferred  Transfers: Assistive device utilized: None  Sit to stand: Complete Independence Stand to sit: Complete Independence Chair to chair: Complete Independence Deferred  Curb:  Deferred  Stairs: Level of Assistance: Modified independence Stair Negotiation Technique: Alternating Pattern  with Bilateral Rails Number of Stairs: 4  Height of Stairs: 6"  Comments: No safety concerns noted  Gait: Gait pattern: WFL Distance walked: 100' Assistive device utilized: None Level of assistance: Complete Independence Comments: No gross deficits identified  Functional Outcome Measures  Results Comments  BERG 53/56 Mild deficits  DGI 23/24 WNL  FGA    TUG 10.7 seconds WNL  5TSTS 26.2 seconds Bilateral knee pain  6 Minute Walk Test    10 Meter Gait Speed Self-selected: 9.9 s = 1.01 m/s; Fastest: 7.9 s = 1.27 m/s WNL  (Blank rows = not tested)   TODAY'S TREATMENT    SUBJECTIVE: Pt reports that he is doing well today. Continues with chronic R CMC joint pain. He had an injection in his [facility] joints bilaterally and notes it has helped ease the pain. No specific questions upon arrival today.   PAIN: Chronic R CMC joint pain;   Neuromuscular  Re-education 6" step taps x 15 BLE; Lateral 6" step taps x 15 BLE; 6" step ups x 15 BLE; Tandem gait picking up cones and placing cones on ground to practice forward reach in small BOS x multiple bouts; Forward/reverse gait in hall while balancing a small ball on cone x multiple  lengths; Forward marching in hall while balancing a small ball on cone x multiple lengths; Airex feet together horizontal and vertical head turns x 30s each; Airex feet together eyes closed x 30s each; Airex FT ball passes from PT with SPT guarding, varying height of return pass x multiple bouts; Rockerboard eyes open horizontal and vertical head turns A/P and R/L orientations x 30s each; Rockerboard eyes open/closed A/P and R/L orientations x 30s each; Toe taps in star pattern 12 o'clock to 6 o'clock and 6 o'clock to 12 o'clock x 3 rounds BLE;   TherEx SciFit L4 x 10 minutes for warm-up and BLE strengtheing during interval history;  Sit to stand from mat table while holding 8# med ball 2 x 10; Lateral steps in // bars with blue tband around knees x multiple lengths;   Not performed: Forward marching gait with contralateral hand to knee x multiple bouts; Forward/backward gait in hall with vertical and horizontal head turns x multiple bouts; 6" and 12" hurdles on ladder with no UE support x multiple lengths; Blaze pods in square (4 pods, random) with forward/retro gait 3 x 90s; Tandem static balance without UE support alternating forward LE x 60s on each side; Tandem gait without UE support x multiple bouts; Lateral gait on Airex beam without UE support x multiple bouts; Cone stacks high and low on airex pad with tandem stance 2 x 60s each; Blaze pods on wall for overhead and low reaching, lateral steps on airex beam, 6 pods with 5 distracting, 3 x 60s; Total Gym (TG) Level 16 (L16) double leg squats x 15; TG L16 single leg squats x 10 BLE, more difficult on RLE; TG L16 double leg heel raises x 20;  Forward/backward gait in hall with ball toss from PT with SPT guarding x multiple bouts, varying height of return; Blaze pods, 2 on floor and 2 on 6" step, toe taps 2 x 60s, first set with one color and second set with 3 distracting colors; Blaze pods toe taps in star pattern, 5 pods with 4  distracting colors 2 x 60s; Airex FT ball passes around body to therapist with return pass on opposite side, therapist varying height of return pass x multiple bouts toward each side; Nautilus resisted 50# forward, backward, R lateral, and L lateral x 3 each direction;    PATIENT EDUCATION:  Education details: Outcome measures and balance exercises; Person educated: Patient Education method: Explanation, Demonstration, and Verbal cues Education comprehension: verbalized understanding   HOME EXERCISE PROGRAM:  Access Code: [email or web address] URL: [email or web address] Date: 12/12/2023 Prepared by: Ria Comment  Exercises - Standing Tandem Balance with Counter Support  - 1 x daily - 7 x weekly - 3 reps - 30s hold - Standing Tandem Balance with Counter Support (Mirrored)  - 1 x daily - 7 x weekly - 3 reps - 30s hold - Tandem Stance with Head Rotation  - 1 x daily - 7 x weekly - 3 reps - 30s hold - Tandem Stance with Head Rotation (Mirrored)  - 1 x daily - 7 x weekly - 3 reps - 30s hold - Tandem Stance with Head Nods   - 1 x daily -  7 x weekly - 3 reps - 30s hold - Tandem Stance with Head Nods  (Mirrored)  - 1 x daily - 7 x weekly - 3 reps - 30s hold - Heel Raise  - 1-2 x daily - 7 x weekly - 2 sets - 10 reps - 3s hold   ASSESSMENT:  CLINICAL IMPRESSION: Progressed balance exercises in today's session with single leg balance. No HEP modifications. Pt encouraged to follow-up as scheduled. Plan to progress balance and strength exercises at future sessions. He will benefit from PT services to address deficits in strength, balance, and mobility in order to return to full function at home and decrease his risk for falls.    OBJECTIVE IMPAIRMENTS: decreased balance, decreased endurance, decreased strength, and pain.   ACTIVITY LIMITATIONS: standing  PARTICIPATION LIMITATIONS: cleaning, shopping, and community activity  PERSONAL FACTORS: Age, Time since onset of  injury/illness/exacerbation, and 3+ comorbidities: OA, OSA, and memory impairments  are also affecting patient's functional outcome.   REHAB POTENTIAL: Good  CLINICAL DECISION MAKING: Evolving/moderate complexity  EVALUATION COMPLEXITY: Moderate   GOALS: Goals reviewed with patient? No  SHORT TERM GOALS: Target date: 01/15/2024  Pt will be independent with HEP in order to improve strength and balance in order to decrease fall risk and improve function at home. Baseline:  Goal status: ONGOING   LONG TERM GOALS: Target date: 02/26/2024  Pt will increase FOTO to at least 63 to demonstrate significant improvement in function at home related to balance  Baseline: 58; 01/22/24: 54;  Goal status: ONGOING  2.  Pt will improve single leg balance to >10s on both legs in order to demonstrate clinically significant improvement in balance.   Baseline: 5-10s on each side; 01/22/24: L: 18s, R: 11s Goal status: ACHIEVED  3.  Pt will improve ABC by at least 13% in order to demonstrate clinically significant improvement in balance confidence.      Baseline: 12/12/23: 68.1%; 01/22/24: 68.1% Goal status: DISCONTINUED;  4. Pt will decrease 5TSTS to less than 14 seconds in order to demonstrate clinically significant improvement in LE strength      Baseline: 26.2s; 01/22/24: 15.4s; Goal status: REVISED  5. Pt will increase MiniBESTest by at least 4 points in order to demonstrate clinically significant improvement in balance.     Baseline: 12/12/23: 17/28; 01/22/24: 25/28; Goal status: ACHIEVED   PLAN: PT FREQUENCY: 1-2x/week  PT DURATION: 12 weeks  PLANNED INTERVENTIONS: Therapeutic exercises, Therapeutic activity, Neuromuscular re-education, Balance training, Gait training, Patient/Family education, Self Care, Joint mobilization, Joint manipulation, Vestibular training, Canalith repositioning, Orthotic/Fit training, DME instructions, Dry Needling, Electrical stimulation, Spinal manipulation, Spinal  mobilization, Cryotherapy, Moist heat, Taping, Traction, Ultrasound, Ionotophoresis 4mg /ml Dexamethasone, Manual therapy, and Re-evaluation.  PLAN FOR NEXT SESSION: progress balance and strengthening, modify/review HEP as needed;  Sherri Sear, SPT [employer] [facility] Huprich PT, DPT, GCS  Huprich,Jason 01/31/2024, 9:26 AM

## 2024-02-03 ENCOUNTER — Ambulatory Visit: Payer: Medicare Other

## 2024-02-03 DIAGNOSIS — R2681 Unsteadiness on feet: Secondary | ICD-10-CM

## 2024-02-03 DIAGNOSIS — M6281 Muscle weakness (generalized): Secondary | ICD-10-CM | POA: Diagnosis not present

## 2024-02-03 NOTE — Therapy (Signed)
OUTPATIENT PHYSICAL THERAPY BALANCE TREATMENT   Patient Name: Shawn Khan MRN: [id number] DOB:08-16-1941, 83 y.o., male Today's Date: 02/03/2024  END OF SESSION:  PT End of Session - 02/03/24 1120     Visit Number 13    Number of Visits 25    Date for PT Re-Evaluation 02/26/24    Authorization Type eval: 12/04/23    PT Start Time 1145    PT Stop Time 1230    PT Time Calculation (min) 45 min    Equipment Utilized During Treatment Gait belt    Activity Tolerance Patient tolerated treatment well    Behavior During Therapy WFL for tasks assessed/performed            Past Medical History:  Diagnosis Date   Arthritis    Chronic hoarseness    Complication of anesthesia    h/o nasal intubation x 1 hoarseness x 1 week   Contusion of chest    Cough    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   Gastritis    GERD (gastroesophageal reflux disease)    Hemorrhagic stroke (HCC)    more than 5 years ago   History of chicken pox    Hyperlipidemia    did not like crestor    Hypertension    Hypoglycemia    Hypogonadism in male    Insomnia    Low testosterone in male    Osteoporosis    Prediabetes    A1C 5.9 06/25/18    Rotator cuff disorder    Sleep apnea    cpap   Stroke (HCC)    Vocal cord dysfunction    in 2018 damage per pt    Vocal cord nodule    Zenker diverticulum    1.7 cm    Past Surgical History:  Procedure Laterality Date   APPENDECTOMY     1955   BALLOON DILATION  12/09/2023   Procedure: BALLOON DILATION;  Surgeon: [name], DO;  Location: [facility] ENDOSCOPY;  Service: Gastroenterology;;   BIOPSY  12/09/2023   Procedure: BIOPSY;  Surgeon: [name], DO;  Location: [facility] ENDOSCOPY;  Service: Gastroenterology;;   ESOPHAGEAL DILATION  01/02/2024   Procedure: ESOPHAGEAL DILATION;  Surgeon: [name], DO;  Location: [facility] ENDOSCOPY;  Service: Gastroenterology;;   ESOPHAGOGASTRODUODENOSCOPY N/A 01/02/2024   Procedure:  ESOPHAGOGASTRODUODENOSCOPY (EGD);  Surgeon: [name], DO;  Location: [facility] ENDOSCOPY;  Service: Gastroenterology;  Laterality: N/A;   ESOPHAGOGASTRODUODENOSCOPY (EGD) WITH PROPOFOL N/A 12/09/2023   Procedure: ESOPHAGOGASTRODUODENOSCOPY (EGD) WITH PROPOFOL;  Surgeon: [name], DO;  Location: [facility] ENDOSCOPY;  Service: Gastroenterology;  Laterality: N/A;   EYE SURGERY     b/l cataract   FEMUR FRACTURE SURGERY     FRACTURE SURGERY     IR GUIDED DRAIN W CATHETER PLACEMENT  06/13/2021   IR PERC CHOLECYSTOSTOMY  06/13/2021   LARYNGOSCOPY  09/17/2017   Procedure: LARYNGOSCOPY;  Surgeon: Linus Salmons, MD;  Location: [facility] ORS;  Service: ENT;;   PENILE BIOPSY     04/2018 ulcer with lichenoid inflammation    rcr Bilateral    SHOULDER SURGERY     x3 right and left    THROAT SURGERY     vocal cord nodule   TONSILLECTOMY     1948   Patient Active Problem List   Diagnosis Date Noted   Acute cholecystitis 06/12/2021   Aortic atherosclerosis (HCC) 03/02/2019   Bradycardia 12/18/2018   Leg cramps 12/18/2018   Prediabetes 10/03/2018   Barrett's esophagus 10/03/2018  Penile ulcer 09/16/2018   Zenker's diverticulum 09/16/2018   Degenerative joint disease involving multiple joints on both sides of body 04/23/2018   OSA (obstructive sleep apnea) 04/23/2018   Urinary urgency 04/23/2018   Seborrheic keratosis 04/23/2018   Microscopic hematuria 11/20/2017   Acquired phimosis of penis 11/20/2017   Benign essential hypertension 11/20/2017   Benign prostatic hyperplasia with urinary obstruction 11/20/2017   Chronic hoarseness 11/20/2017   Contusion of chest 11/20/2017   Disorder of bone and articular cartilage 11/20/2017   Disorder of rotator cuff 11/20/2017   Dysphagia 11/20/2017   Gastroesophageal reflux disease 11/20/2017   Generalized osteoarthritis 11/20/2017   History of stroke without residual deficits 11/20/2017   Hypoxemia 11/20/2017   Impaired fasting glucose  11/20/2017   Increased frequency of urination 11/20/2017   Insomnia 11/20/2017   Backache 11/20/2017   Mixed hyperlipidemia 11/20/2017   Obstructive sleep apnea syndrome 11/20/2017   Osteoporosis 11/20/2017   Urinary urgency 11/20/2017   Shoulder joint painful on movement 11/20/2017   Seborrheic keratosis 11/20/2017   Rib pain 11/20/2017   Pharyngitis 11/20/2017   Male hypogonadism 11/20/2017    PCP: Marguarite Arbour, MD  REFERRING PROVIDER: Lonell Face, MD   REFERRING DIAG: R53.81 (ICD-10-CM) - Other malaise   RATIONALE FOR EVALUATION AND TREATMENT: Rehabilitation  THERAPY DIAG: Muscle weakness (generalized)  Unsteadiness on feet  ONSET DATE: 1 year (approximate)  FOLLOW-UP APPT SCHEDULED WITH REFERRING PROVIDER: Yes   FROM INITIAL EVALUATION SUBJECTIVE:                                                                                                                                                                                         SUBJECTIVE STATEMENT:  Progressive weakness  PERTINENT HISTORY:  Pt reports progressive weakness for the last year as well as issues with endurance. No known cause. History of chronic bilateral knee pain and L shoulder pain s/p surgery. He also complains of bilateral wrist pain. History of CVA "30 years ago." He has some residual RUE numbness from the CVA but otherwise no residual deficits. Pt reports that he struggles with his memory. He has been seeing floaters recently and has an appt with an eye doctor today after his PT evaluation.   10/12/2023 MRI Brain without contrast  IMPRESSION:  1. No evidence of acute intracranial abnormality.  2. Chronic hemorrhage or chronic hemorrhagic infarct within the left  subinsular white matter/basal ganglia.  3. Minimal chronic small-vessel ischemic changes elsewhere within  the cerebral white matter.  4. Mild generalized cerebral atrophy.  Pain: Yes, bilateral knee pain Numbness/Tingling: Yes,  RUE Focal Weakness: No Recent changes in overall health/medication:  No Prior history of physical therapy for balance:  No Dominant hand: right Imaging: Yes  Red flags: Negative for bowel/bladder changes, saddle paresthesia, personal history of cancer, h/o spinal tumors, h/o compression fx, h/o abdominal aneurysm, abdominal pain, chills/fever, night sweats, nausea, vomiting, unrelenting pain, first onset of insidious LBP <19 y/o  PRECAUTIONS: Fall  WEIGHT BEARING RESTRICTIONS: No  FALLS: Has patient fallen in last 6 months? No  Living Environment Lives with: lives with their spouse, daughter and granddaughter next door. Grandson lives 10 minutes away. Lives in: House/apartment, ramp as well as 9 steps with bilateral wide rails Has following equipment at home: Single point cane, Walker - 2 wheeled, Environmental consultant - 4 wheeled, and power. Walk in shower with seat and grab bars  Prior level of function: Independent  Occupational demands: Retired Curator for [employer]: [place], playing games, helping daughter with her Armed forces technical officer business, caring for his chickens  Patient Goals: "Get back to gardening" Pt would like to improve his endurance   OBJECTIVE:   Patient Surveys  FOTO: 58, predicted improvement to 63 ABC: To be completed (given to pt to take home)   Updated outcome measures 01/22/24: FOTO: 54 ABC: 68.1% 5TSTS: 15.4s MiniBESTest: 25/28 Single leg balance:  L: 18s, R: 11s  Cognition Patient is oriented to person, place, and time.  Recent memory is intact.  Remote memory is intact.  Attention span and concentration are intact.  Expressive speech is intact.  Patient's fund of knowledge is within normal limits for educational level.    Gross Musculoskeletal Assessment Tremor: None Bulk: Normal Tone: Normal  Posture: No gross abnormalities noted in standing or seated posture  AROM Deferred  LE MMT: MMT (out of 5) Right  Left   Hip flexion  4 4  Hip extension    Hip abduction (seated) 4 4  Hip adduction (seated) 4 4  Hip internal rotation    Hip external rotation    Knee flexion (seated) 5 5  Knee extension 5 5  Ankle dorsiflexion 5 5  Ankle plantarflexion    Ankle inversion    Ankle eversion    (* = pain; Blank rows = not tested)  UE MMT grossly WNL and symmetrical  Sensation Deferred  Reflexes Deferred  Cranial Nerves Deferred  Coordination/Cerebellar Deferred  Bed mobility: Deferred  Transfers: Assistive device utilized: None  Sit to stand: Complete Independence Stand to sit: Complete Independence Chair to chair: Complete Independence Deferred  Curb:  Deferred  Stairs: Level of Assistance: Modified independence Stair Negotiation Technique: Alternating Pattern  with Bilateral Rails Number of Stairs: 4  Height of Stairs: 6"  Comments: No safety concerns noted  Gait: Gait pattern: WFL Distance walked: 100' Assistive device utilized: None Level of assistance: Complete Independence Comments: No gross deficits identified  Functional Outcome Measures  Results Comments  BERG 53/56 Mild deficits  DGI 23/24 WNL  FGA    TUG 10.7 seconds WNL  5TSTS 26.2 seconds Bilateral knee pain  6 Minute Walk Test    10 Meter Gait Speed Self-selected: 9.9 s = 1.01 m/s; Fastest: 7.9 s = 1.27 m/s WNL  (Blank rows = not tested)   TODAY'S TREATMENT    SUBJECTIVE: Pt reports that he is doing well today. Continues with chronic R CMC joint pain. He had an injection in his [facility] joints bilaterally and notes it has continued to help ease the pain. No specific questions upon arrival today.   PAIN: Chronic R CMC joint pain;  Neuromuscular Re-education 6" step taps x 15 BLE; Lateral 6" step taps x 15 BLE; 6" step ups x 15 BLE; Tandem gait with no UE support on ladder in gym x multiple lengths; Airex feet together horizontal and vertical head turns x 30s each; Airex feet together eyes closed x 30s  each; Airex staggered stance with foot on 6" step eyes open/closed x 30s each BLE; Airex FT ball passes from PT with SPT guarding, varying height of return pass x multiple bouts; Forward marching gait with contralateral hand to knee around gym x 2 laps; Single leg balance without UE support in // bars x 5 BLE with 5-15s hold; Lateral gait on Airex beam without UE support x multiple bouts; Lateral gait on airex beam without UE support with horizontal/vertical head turns x multiple bouts;   TherEx NuStep L1-4 x 10 minutes for warm-up and BLE strengtheing during interval history;  Sit to stand from mat table while holding 8# med ball 2 x 10;   Not performed: Lateral steps in // bars with blue tband around knees x multiple lengths; Tandem gait picking up cones and placing cones on ground to practice forward reach in small BOS x multiple bouts; Forward/reverse gait in hall while balancing a small ball on cone x multiple lengths; Forward marching in hall while balancing a small ball on cone x multiple lengths; Rockerboard eyes open horizontal and vertical head turns A/P and R/L orientations x 30s each; Rockerboard eyes open/closed A/P and R/L orientations x 30s each; Toe taps in star pattern 12 o'clock to 6 o'clock and 6 o'clock to 12 o'clock x 3 rounds BLE; Forward/backward gait in hall with vertical and horizontal head turns x multiple bouts; 6" and 12" hurdles on ladder with no UE support x multiple lengths; Blaze pods in square (4 pods, random) with forward/retro gait 3 x 90s; Tandem static balance without UE support alternating forward LE x 60s on each side; Tandem gait without UE support x multiple bouts; Cone stacks high and low on airex pad with tandem stance 2 x 60s each; Blaze pods on wall for overhead and low reaching, lateral steps on airex beam, 6 pods with 5 distracting, 3 x 60s; Total Gym (TG) Level 16 (L16) double leg squats x 15; TG L16 single leg squats x 10 BLE, more  difficult on RLE; TG L16 double leg heel raises x 20;  Forward/backward gait in hall with ball toss from PT with SPT guarding x multiple bouts, varying height of return; Blaze pods, 2 on floor and 2 on 6" step, toe taps 2 x 60s, first set with one color and second set with 3 distracting colors; Blaze pods toe taps in star pattern, 5 pods with 4 distracting colors 2 x 60s; Airex FT ball passes around body to therapist with return pass on opposite side, therapist varying height of return pass x multiple bouts toward each side; Nautilus resisted 50# forward, backward, R lateral, and L lateral x 3 each direction;    PATIENT EDUCATION:  Education details: Outcome measures and balance exercises; Person educated: Patient Education method: Explanation, Demonstration, and Verbal cues Education comprehension: verbalized understanding   HOME EXERCISE PROGRAM:  Access Code: [email or web address] URL: [email or web address] Date: 12/12/2023 Prepared by: Ria Comment  Exercises - Standing Tandem Balance with Counter Support  - 1 x daily - 7 x weekly - 3 reps - 30s hold - Standing Tandem Balance with Counter Support (Mirrored)  - 1 x daily - 7 x weekly - 3  reps - 30s hold - Tandem Stance with Head Rotation  - 1 x daily - 7 x weekly - 3 reps - 30s hold - Tandem Stance with Head Rotation (Mirrored)  - 1 x daily - 7 x weekly - 3 reps - 30s hold - Tandem Stance with Head Nods   - 1 x daily - 7 x weekly - 3 reps - 30s hold - Tandem Stance with Head Nods  (Mirrored)  - 1 x daily - 7 x weekly - 3 reps - 30s hold - Heel Raise  - 1-2 x daily - 7 x weekly - 2 sets - 10 reps - 3s hold   ASSESSMENT:  CLINICAL IMPRESSION: Pt demonstrated excellent movement in today's session. Overall, his energy levels remained high and he was highly motivated. Progressed balance exercises in today's session. No HEP modifications. Pt encouraged to follow-up as scheduled. Plan to discharge at next visit. He will benefit from  PT services to address deficits in strength, balance, and mobility in order to return to full function at home and decrease his risk for falls.    OBJECTIVE IMPAIRMENTS: decreased balance, decreased endurance, decreased strength, and pain.   ACTIVITY LIMITATIONS: standing  PARTICIPATION LIMITATIONS: cleaning, shopping, and community activity  PERSONAL FACTORS: Age, Time since onset of injury/illness/exacerbation, and 3+ comorbidities: OA, OSA, and memory impairments  are also affecting patient's functional outcome.   REHAB POTENTIAL: Good  CLINICAL DECISION MAKING: Evolving/moderate complexity  EVALUATION COMPLEXITY: Moderate   GOALS: Goals reviewed with patient? No  SHORT TERM GOALS: Target date: 01/15/2024  Pt will be independent with HEP in order to improve strength and balance in order to decrease fall risk and improve function at home. Baseline:  Goal status: ONGOING   LONG TERM GOALS: Target date: 02/26/2024  Pt will increase FOTO to at least 63 to demonstrate significant improvement in function at home related to balance  Baseline: 58; 01/22/24: 54;  Goal status: ONGOING  2.  Pt will improve single leg balance to >10s on both legs in order to demonstrate clinically significant improvement in balance.   Baseline: 5-10s on each side; 01/22/24: L: 18s, R: 11s Goal status: ACHIEVED  3.  Pt will improve ABC by at least 13% in order to demonstrate clinically significant improvement in balance confidence.      Baseline: 12/12/23: 68.1%; 01/22/24: 68.1% Goal status: DISCONTINUED;  4. Pt will decrease 5TSTS to less than 14 seconds in order to demonstrate clinically significant improvement in LE strength      Baseline: 26.2s; 01/22/24: 15.4s; Goal status: REVISED  5. Pt will increase MiniBESTest by at least 4 points in order to demonstrate clinically significant improvement in balance.     Baseline: 12/12/23: 17/28; 01/22/24: 25/28; Goal status: ACHIEVED   PLAN: PT FREQUENCY:  1-2x/week  PT DURATION: 12 weeks  PLANNED INTERVENTIONS: Therapeutic exercises, Therapeutic activity, Neuromuscular re-education, Balance training, Gait training, Patient/Family education, Self Care, Joint mobilization, Joint manipulation, Vestibular training, Canalith repositioning, Orthotic/Fit training, DME instructions, Dry Needling, Electrical stimulation, Spinal manipulation, Spinal mobilization, Cryotherapy, Moist heat, Taping, Traction, Ultrasound, Ionotophoresis 4mg /ml Dexamethasone, Manual therapy, and Re-evaluation.  PLAN FOR NEXT SESSION: progress balance and strengthening, modify/review HEP as needed;  Sherri Sear, SPT [employer] [facility] Huprich PT, DPT, GCS  Huprich,Jason 02/03/2024, 2:29 PM

## 2024-02-05 ENCOUNTER — Ambulatory Visit: Payer: Medicare Other

## 2024-02-17 ENCOUNTER — Ambulatory Visit: Payer: Medicare Other | Attending: Neurology | Admitting: Speech Pathology

## 2024-02-17 DIAGNOSIS — R41841 Cognitive communication deficit: Secondary | ICD-10-CM | POA: Diagnosis present

## 2024-02-17 NOTE — Therapy (Unsigned)
 OUTPATIENT SPEECH LANGUAGE PATHOLOGY  COGNITION EVALUATION   Patient Name: Shawn Khan MRN: [id number] DOB:04/10/1941, 83 y.o., male Today's Date: 02/17/2024  PCP: Aram Beecham, MD REFERRING PROVIDER: Cristopher Peru, MD   End of Session - 02/17/24 [phone]     Visit Number 1    Number of Visits 17    Date for SLP Re-Evaluation 04/13/24    Authorization Type [employer] [facility] Medicare    Progress Note Due on Visit 10    SLP Start Time 0800    SLP Stop Time  0845    SLP Time Calculation (min) 45 min    Activity Tolerance Patient tolerated treatment well             Past Medical History:  Diagnosis Date   Arthritis    Chronic hoarseness    Complication of anesthesia    h/o nasal intubation x 1 hoarseness x 1 week   Contusion of chest    Cough    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   Gastritis    GERD (gastroesophageal reflux disease)    Hemorrhagic stroke (HCC)    more than 5 years ago   History of chicken pox    Hyperlipidemia    did not like crestor    Hypertension    Hypoglycemia    Hypogonadism in male    Insomnia    Low testosterone in male    Osteoporosis    Prediabetes    A1C 5.9 06/25/18    Rotator cuff disorder    Sleep apnea    cpap   Stroke [facility])    Vocal cord dysfunction    in 2018 damage per pt    Vocal cord nodule    Zenker diverticulum    1.7 cm    Past Surgical History:  Procedure Laterality Date   APPENDECTOMY     1955   BALLOON DILATION  12/09/2023   Procedure: BALLOON DILATION;  Surgeon: [name], DO;  Location: [facility] ENDOSCOPY;  Service: Gastroenterology;;   BIOPSY  12/09/2023   Procedure: BIOPSY;  Surgeon: [name], DO;  Location: [facility] ENDOSCOPY;  Service: Gastroenterology;;   ESOPHAGEAL DILATION  01/02/2024   Procedure: ESOPHAGEAL DILATION;  Surgeon: [name], DO;  Location: [facility] ENDOSCOPY;  Service: Gastroenterology;;   ESOPHAGOGASTRODUODENOSCOPY N/A 01/02/2024   Procedure:  ESOPHAGOGASTRODUODENOSCOPY (EGD);  Surgeon: [name], DO;  Location: [facility] ENDOSCOPY;  Service: Gastroenterology;  Laterality: N/A;   ESOPHAGOGASTRODUODENOSCOPY (EGD) WITH PROPOFOL N/A 12/09/2023   Procedure: ESOPHAGOGASTRODUODENOSCOPY (EGD) WITH PROPOFOL;  Surgeon: [name], DO;  Location: [facility] ENDOSCOPY;  Service: Gastroenterology;  Laterality: N/A;   EYE SURGERY     b/l cataract   FEMUR FRACTURE SURGERY     FRACTURE SURGERY     IR GUIDED DRAIN W CATHETER PLACEMENT  06/13/2021   IR PERC CHOLECYSTOSTOMY  06/13/2021   LARYNGOSCOPY  09/17/2017   Procedure: LARYNGOSCOPY;  Surgeon: Linus Salmons, MD;  Location: [facility] ORS;  Service: ENT;;   PENILE BIOPSY     04/2018 ulcer with lichenoid inflammation    rcr Bilateral    SHOULDER SURGERY     x3 right and left    THROAT SURGERY     vocal cord nodule   TONSILLECTOMY     1948   Patient Active Problem List   Diagnosis Date Noted   Acute cholecystitis 06/12/2021   Aortic atherosclerosis (HCC) 03/02/2019   Bradycardia 12/18/2018   Leg cramps 12/18/2018   Prediabetes 10/03/2018   Barrett's  esophagus 10/03/2018   Penile ulcer 09/16/2018   Zenker's diverticulum 09/16/2018   Degenerative joint disease involving multiple joints on both sides of body 04/23/2018   OSA (obstructive sleep apnea) 04/23/2018   Urinary urgency 04/23/2018   Seborrheic keratosis 04/23/2018   Microscopic hematuria 11/20/2017   Acquired phimosis of penis 11/20/2017   Benign essential hypertension 11/20/2017   Benign prostatic hyperplasia with urinary obstruction 11/20/2017   Chronic hoarseness 11/20/2017   Contusion of chest 11/20/2017   Disorder of bone and articular cartilage 11/20/2017   Disorder of rotator cuff 11/20/2017   Dysphagia 11/20/2017   Gastroesophageal reflux disease 11/20/2017   Generalized osteoarthritis 11/20/2017   History of stroke without residual deficits 11/20/2017   Hypoxemia 11/20/2017   Impaired fasting glucose  11/20/2017   Increased frequency of urination 11/20/2017   Insomnia 11/20/2017   Backache 11/20/2017   Mixed hyperlipidemia 11/20/2017   Obstructive sleep apnea syndrome 11/20/2017   Osteoporosis 11/20/2017   Urinary urgency 11/20/2017   Shoulder joint painful on movement 11/20/2017   Seborrheic keratosis 11/20/2017   Rib pain 11/20/2017   Pharyngitis 11/20/2017   Male hypogonadism 11/20/2017    ONSET DATE: > 1year: date of referral 02/05/2024   REFERRING DIAG: R41.3 (ICD-10-CM) - Other amnesia  THERAPY DIAG:  Cognitive communication deficit  Rationale for Evaluation and Treatment Rehabilitation  SUBJECTIVE:   SUBJECTIVE STATEMENT: Pt pleasant, HOH; "my thinking is slower than most people's speech I have trouble remembering, I want to say something and the word won't be there" Pt accompanied by: significant other  PERTINENT HISTORY and DIAGNOSTIC FINDINGS: Pt is a right handed 83 year old male with medical history of hearing loss, insomnia, mild cognitive impairment, hx of left CVA. Per chart review neurologist notes "Stable memory loss with a history of cerebral atrophy and white matter microvascular ischemic changes on MRI. SLUMS score of 28/30 on 10/01/2023"  MRI Brain without contrast 10/12/2023 IMPRESSION:  1. No evidence of acute intracranial abnormality.  2. Chronic hemorrhage or chronic hemorrhagic infarct within the left  subinsular white matter/basal ganglia.  3. Minimal chronic small-vessel ischemic changes elsewhere within  the cerebral white matter.  4. Mild generalized cerebral atrophy.     PAIN:  Are you having pain? No   FALLS: Has patient fallen in last 6 months?  No  LIVING ENVIRONMENT: Lives with: lives with their spouse Lives in: House/apartment  PLOF:  Level of assistance: Independent with ADLs, Independent with IADLs Employment: Retired   PATIENT GOALS   to improve memory  OBJECTIVE:   COGNITIVE COMMUNICATION Overall cognitive  status: Impaired Areas of impairment:  Memory: Impaired: Short term Functional Impairments: pt and his wife report difficulty with short term memory  AUDITORY COMPREHENSION  Overall auditory comprehension: Appears intact YES/NO questions: Appears intact Following directions: Appears intact Conversation: Moderately Complex  READING COMPREHENSION: Intact  EXPRESSION: verbal  VERBAL EXPRESSION:   Overall verbal expression: Appears intact Level of generative/spontaneous verbalization: conversation Automatic speech: name: intact and social response: intact  Repetition: Appears intact Naming: Responsive: 76-100%, Confrontation: 76-100%, Convergent: 76-100%, and Divergent: 76-100% Pragmatics: Appears intact Non-verbal means of communication: N/A  WRITTEN EXPRESSION: Dominant hand: right Written expression: Appears intact  ORAL MOTOR EXAMINATION Facial : WFL Lingual: WFL Velum: WFL Mandible: WFL Cough: WFL Voice: WFL  MOTOR SPEECH: Overall motor speech: Appears intact Respiration: diaphragmatic/abdominal breathing Phonation: normal Resonance: WFL Articulation: Appears intact Intelligibility: Intelligible Motor planning: Appears intact   STANDARDIZED ASSESSMENTS: Addenbrooke's Cognitive Examination - ACE III The Addenbrooke's Cognitive Examination-III (  ACE-III) is a brief cognitive test that assesses five cognitive domains. The total score is 100 with higher scores indicating better cognitive functioning. Cut off scores of 88 and 82 are recommended for suspicion of dementia (88 has sensitivity of 1.00 and specificity of 0.96, 82 has sensitivity of 0.93 and specificity of 1.00). American Version A  Attention 18/18  Memory 20/26  Fluency 11/14  Language 25/26  Visuospatial 16/16  TOTAL ACE- III Score 90/100     PATIENT REPORTED OUTCOME MEASURES (PROM): To be completed over the next 3 sessions   TODAY'S TREATMENT:  N/A   PATIENT EDUCATION: Education details: ST  POC Person educated: Patient and Spouse Education method: Explanation Education comprehension: verbalized understanding   HOME EXERCISE PROGRAM:   N/A   GOALS:  Goals reviewed with patient? Yes  SHORT TERM GOALS: Target date: 10 sessions  With Mod I, patient will demonstrate understanding of functional cognitive activities by listing 2 activities for home maintenance program. Baseline: Goal status: INITIAL  2.   With Mod I, patient will recall information after given delay of increasing length (5 to 60 seconds) with 80% accuracy. Baseline:  Goal status: INITIAL   LONG TERM GOALS: Target date: 04/13/2024  With Mod I, patient will use strategies to improve memory for important information with 90% acc. Independently (ie., white board, daily planner/calendar, Apps on phone).   Baseline:  Goal status: INITIAL    ASSESSMENT:  CLINICAL IMPRESSION: Patient is a 83 y.o. male who was seen today for a cognitive communication evaluation d/t report of memory loss. Marland KitchenPt presents with objective score of 90 out of 100 on the ACE but reports functional deficits "my thinking is slower than most people's speech I have trouble remembering, I wan to say something and the word won't be there." Recommend continued assessment and implementation of compensatory memory strategies to improve functional independence.    OBJECTIVE IMPAIRMENTS include memory. These impairments are limiting patient from managing medications, managing appointments, managing finances, household responsibilities, and effectively communicating at home and in community. Factors affecting potential to achieve goals and functional outcome are previous level of function. Patient will benefit from skilled SLP services to address above impairments and improve overall function.  REHAB POTENTIAL: Good  PLAN: SLP FREQUENCY: 1-2x/week  SLP DURATION: 8 weeks  PLANNED INTERVENTIONS: Internal/external aids, SLP instruction and  feedback, Compensatory strategies, and Patient/family education   Lauris Keepers B. Dreama Saa, M.S., CCC-SLP, Tree surgeon Certified Brain Injury Specialist [facility]  [facility] Rehabilitation Services Office [phone] Ascom [phone] Fax [phone]

## 2024-02-19 ENCOUNTER — Ambulatory Visit: Payer: Medicare Other | Admitting: Speech Pathology

## 2024-02-19 DIAGNOSIS — R41841 Cognitive communication deficit: Secondary | ICD-10-CM | POA: Diagnosis not present

## 2024-02-19 NOTE — Therapy (Signed)
 OUTPATIENT SPEECH LANGUAGE PATHOLOGY  COGNITION TREATMENT NOTE   Patient Name: Shawn Khan MRN: [id number] DOB:1941/01/25, 83 y.o., male Today's Date: 02/19/2024  PCP: Aram Beecham, MD REFERRING PROVIDER: Cristopher Peru, MD   End of Session - 02/19/24 0757     Visit Number 2    Number of Visits 17    Date for SLP Re-Evaluation 04/13/24    Authorization Type Blue Cross [facility] Medicare    Progress Note Due on Visit 10    SLP Start Time 0800    SLP Stop Time  0845    SLP Time Calculation (min) 45 min    Activity Tolerance Patient tolerated treatment well             Past Medical History:  Diagnosis Date   Arthritis    Chronic hoarseness    Complication of anesthesia    h/o nasal intubation x 1 hoarseness x 1 week   Contusion of chest    Cough    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   Gastritis    GERD (gastroesophageal reflux disease)    Hemorrhagic stroke (HCC)    more than 5 years ago   History of chicken pox    Hyperlipidemia    did not like crestor    Hypertension    Hypoglycemia    Hypogonadism in male    Insomnia    Low testosterone in male    Osteoporosis    Prediabetes    A1C 5.9 06/25/18    Rotator cuff disorder    Sleep apnea    cpap   Stroke [facility])    Vocal cord dysfunction    in 2018 damage per pt    Vocal cord nodule    Zenker diverticulum    1.7 cm    Past Surgical History:  Procedure Laterality Date   APPENDECTOMY     1955   BALLOON DILATION  12/09/2023   Procedure: BALLOON DILATION;  Surgeon: [name], DO;  Location: [facility] ENDOSCOPY;  Service: Gastroenterology;;   BIOPSY  12/09/2023   Procedure: BIOPSY;  Surgeon: [name], DO;  Location: [facility] ENDOSCOPY;  Service: Gastroenterology;;   ESOPHAGEAL DILATION  01/02/2024   Procedure: ESOPHAGEAL DILATION;  Surgeon: [name], DO;  Location: [facility] ENDOSCOPY;  Service: Gastroenterology;;   ESOPHAGOGASTRODUODENOSCOPY N/A 01/02/2024   Procedure:  ESOPHAGOGASTRODUODENOSCOPY (EGD);  Surgeon: [name], DO;  Location: [facility] ENDOSCOPY;  Service: Gastroenterology;  Laterality: N/A;   ESOPHAGOGASTRODUODENOSCOPY (EGD) WITH PROPOFOL N/A 12/09/2023   Procedure: ESOPHAGOGASTRODUODENOSCOPY (EGD) WITH PROPOFOL;  Surgeon: [name], DO;  Location: [facility] ENDOSCOPY;  Service: Gastroenterology;  Laterality: N/A;   EYE SURGERY     b/l cataract   FEMUR FRACTURE SURGERY     FRACTURE SURGERY     IR GUIDED DRAIN W CATHETER PLACEMENT  06/13/2021   IR PERC CHOLECYSTOSTOMY  06/13/2021   LARYNGOSCOPY  09/17/2017   Procedure: LARYNGOSCOPY;  Surgeon: Linus Salmons, MD;  Location: [facility] ORS;  Service: ENT;;   PENILE BIOPSY     04/2018 ulcer with lichenoid inflammation    rcr Bilateral    SHOULDER SURGERY     x3 right and left    THROAT SURGERY     vocal cord nodule   TONSILLECTOMY     1948   Patient Active Problem List   Diagnosis Date Noted   Acute cholecystitis 06/12/2021   Aortic atherosclerosis (HCC) 03/02/2019   Bradycardia 12/18/2018   Leg cramps 12/18/2018   Prediabetes 10/03/2018  Barrett's esophagus 10/03/2018   Penile ulcer 09/16/2018   Zenker's diverticulum 09/16/2018   Degenerative joint disease involving multiple joints on both sides of body 04/23/2018   OSA (obstructive sleep apnea) 04/23/2018   Urinary urgency 04/23/2018   Seborrheic keratosis 04/23/2018   Microscopic hematuria 11/20/2017   Acquired phimosis of penis 11/20/2017   Benign essential hypertension 11/20/2017   Benign prostatic hyperplasia with urinary obstruction 11/20/2017   Chronic hoarseness 11/20/2017   Contusion of chest 11/20/2017   Disorder of bone and articular cartilage 11/20/2017   Disorder of rotator cuff 11/20/2017   Dysphagia 11/20/2017   Gastroesophageal reflux disease 11/20/2017   Generalized osteoarthritis 11/20/2017   History of stroke without residual deficits 11/20/2017   Hypoxemia 11/20/2017   Impaired fasting glucose  11/20/2017   Increased frequency of urination 11/20/2017   Insomnia 11/20/2017   Backache 11/20/2017   Mixed hyperlipidemia 11/20/2017   Obstructive sleep apnea syndrome 11/20/2017   Osteoporosis 11/20/2017   Urinary urgency 11/20/2017   Shoulder joint painful on movement 11/20/2017   Seborrheic keratosis 11/20/2017   Rib pain 11/20/2017   Pharyngitis 11/20/2017   Male hypogonadism 11/20/2017    ONSET DATE: > 1year: date of referral 02/05/2024   REFERRING DIAG: R41.3 (ICD-10-CM) - Other amnesia  THERAPY DIAG:  Cognitive communication deficit  Rationale for Evaluation and Treatment Rehabilitation  SUBJECTIVE:   PERTINENT HISTORY and DIAGNOSTIC FINDINGS: Pt is a right handed 83 year old male with medical history of hearing loss, insomnia, mild cognitive impairment, hx of left CVA. Per chart review neurologist notes "Stable memory loss with a history of cerebral atrophy and white matter microvascular ischemic changes on MRI. SLUMS score of 28/30 on 10/01/2023"  MRI Brain without contrast 10/12/2023 IMPRESSION:  1. No evidence of acute intracranial abnormality.  2. Chronic hemorrhage or chronic hemorrhagic infarct within the left  subinsular white matter/basal ganglia.  3. Minimal chronic small-vessel ischemic changes elsewhere within  the cerebral white matter.  4. Mild generalized cerebral atrophy.     PAIN:  Are you having pain? No   FALLS: Has patient fallen in last 6 months?  No  LIVING ENVIRONMENT: Lives with: lives with their spouse Lives in: House/apartment  PLOF:  Level of assistance: Independent with ADLs, Independent with IADLs Employment: Retired   PATIENT GOALS   to improve memory  SUBJECTIVE STATEMENT: Pt pleasant, "I forgot to wear my hearing aids this morning, we were in a rush" Pt accompanied by: significant other   OBJECTIVE:   TODAY'S TREATMENT:  Skilled treatment session focused on pt's cognitive communication goals. SLP facilitated  session by providing the following interventions:  While pt forgot to put his hearing aids this morning (they were in a rush) he and his wife state that he wears them everyday/all day. During the evaluation, pt was not able to hear what was said well. Education provided on benefits of a full audiological assessment. Education provided on management and wear for hearing aids. SLP assisted pt and his wife with calling audiologist and scheduling appt. This Clinical research associate also sent a secure email to audiologist with information related to pt and rationale for evaluation.    PATIENT REPORTED OUTCOME MEASURES (PROM): To be completed over the next 3 sessions    PATIENT EDUCATION: Education details: hearing loss and cognitive function Person educated: Patient and Spouse Education method: Explanation Education comprehension: verbalized understanding   HOME EXERCISE PROGRAM:   N/A   GOALS:  Goals reviewed with patient? Yes  SHORT TERM GOALS: Target date:  10 sessions  With Mod I, patient will demonstrate understanding of functional cognitive activities by listing 2 activities for home maintenance program. Baseline: Goal status: INITIAL  2.   With Mod I, patient will recall information after given delay of increasing length (5 to 60 seconds) with 80% accuracy. Baseline:  Goal status: INITIAL   LONG TERM GOALS: Target date: 04/13/2024  With Mod I, patient will use strategies to improve memory for important information with 90% acc. Independently (ie., white board, daily planner/calendar, Apps on phone).   Baseline:  Goal status: INITIAL    ASSESSMENT:  CLINICAL IMPRESSION: Patient is a 83 y.o. male who was seen today for a cognitive communication evaluation d/t report of memory loss. Marland KitchenPt presents with objective score of 90 out of 100 on the ACE but reports functional deficits "my thinking is slower than most people's speech I have trouble remembering, I wan to say something and the word  won't be there." Recommend continued assessment and implementation of compensatory memory strategies to improve functional independence.   Pt has an appt scheduled with audiologist today at 11am for full assessment.    OBJECTIVE IMPAIRMENTS include memory. These impairments are limiting patient from managing medications, managing appointments, managing finances, household responsibilities, and effectively communicating at home and in community. Factors affecting potential to achieve goals and functional outcome are previous level of function. Patient will benefit from skilled SLP services to address above impairments and improve overall function.  REHAB POTENTIAL: Good  PLAN: SLP FREQUENCY: 1-2x/week  SLP DURATION: 8 weeks  PLANNED INTERVENTIONS: Internal/external aids, SLP instruction and feedback, Compensatory strategies, and Patient/family education   Mertha Clyatt B. Dreama Saa, M.S., CCC-SLP, Tree surgeon Certified Brain Injury Specialist [facility]  [facility] Rehabilitation Services Office [phone] Ascom [phone] Fax [phone]

## 2024-02-24 ENCOUNTER — Ambulatory Visit: Payer: Medicare Other | Admitting: Speech Pathology

## 2024-02-24 DIAGNOSIS — R41841 Cognitive communication deficit: Secondary | ICD-10-CM | POA: Diagnosis not present

## 2024-02-24 NOTE — Therapy (Signed)
 OUTPATIENT SPEECH LANGUAGE PATHOLOGY  COGNITION TREATMENT NOTE   Patient Name: Shawn Khan MRN: [id number] DOB:[birth year], 83 y.o., male Today's Date: 02/24/2024  PCP: Aram Beecham, MD REFERRING PROVIDER: Cristopher Peru, MD   End of Session - 02/24/24 1131     Visit Number 3    Number of Visits 17    Date for SLP Re-Evaluation 04/13/24    Authorization Type [employer] [facility] Medicare    Progress Note Due on Visit 10    SLP Start Time 0800    SLP Stop Time  [phone]    SLP Time Calculation (min) 55 min    Activity Tolerance Patient tolerated treatment well             Past Medical History:  Diagnosis Date   Arthritis    Chronic hoarseness    Complication of anesthesia    h/o nasal intubation x 1 hoarseness x 1 week   Contusion of chest    Cough    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   Gastritis    GERD (gastroesophageal reflux disease)    Hemorrhagic stroke (HCC)    more than 5 years ago   History of chicken pox    Hyperlipidemia    did not like crestor    Hypertension    Hypoglycemia    Hypogonadism in male    Insomnia    Low testosterone in male    Osteoporosis    Prediabetes    A1C 5.9 06/25/18    Rotator cuff disorder    Sleep apnea    cpap   Stroke [facility])    Vocal cord dysfunction    in 2018 damage per pt    Vocal cord nodule    Zenker diverticulum    1.7 cm    Past Surgical History:  Procedure Laterality Date   APPENDECTOMY     1955   BALLOON DILATION  12/09/2023   Procedure: BALLOON DILATION;  Surgeon: [name], DO;  Location: [facility] ENDOSCOPY;  Service: Gastroenterology;;   BIOPSY  12/09/2023   Procedure: BIOPSY;  Surgeon: [name], DO;  Location: [facility] ENDOSCOPY;  Service: Gastroenterology;;   ESOPHAGEAL DILATION  01/02/2024   Procedure: ESOPHAGEAL DILATION;  Surgeon: [name], DO;  Location: [facility] ENDOSCOPY;  Service: Gastroenterology;;   ESOPHAGOGASTRODUODENOSCOPY N/A 01/02/2024   Procedure:  ESOPHAGOGASTRODUODENOSCOPY (EGD);  Surgeon: [name], DO;  Location: [facility] ENDOSCOPY;  Service: Gastroenterology;  Laterality: N/A;   ESOPHAGOGASTRODUODENOSCOPY (EGD) WITH PROPOFOL N/A 12/09/2023   Procedure: ESOPHAGOGASTRODUODENOSCOPY (EGD) WITH PROPOFOL;  Surgeon: [name], DO;  Location: [facility] ENDOSCOPY;  Service: Gastroenterology;  Laterality: N/A;   EYE SURGERY     b/l cataract   FEMUR FRACTURE SURGERY     FRACTURE SURGERY     IR GUIDED DRAIN W CATHETER PLACEMENT  06/13/2021   IR PERC CHOLECYSTOSTOMY  06/13/2021   LARYNGOSCOPY  09/17/2017   Procedure: LARYNGOSCOPY;  Surgeon: Linus Salmons, MD;  Location: [facility] ORS;  Service: ENT;;   PENILE BIOPSY     04/2018 ulcer with lichenoid inflammation    rcr Bilateral    SHOULDER SURGERY     x3 right and left    THROAT SURGERY     vocal cord nodule   TONSILLECTOMY     1948   Patient Active Problem List   Diagnosis Date Noted   Acute cholecystitis 06/12/2021   Aortic atherosclerosis (HCC) 03/02/2019   Bradycardia 12/18/2018   Leg cramps 12/18/2018   Prediabetes 10/03/2018  Barrett's esophagus 10/03/2018   Penile ulcer 09/16/2018   Zenker's diverticulum 09/16/2018   Degenerative joint disease involving multiple joints on both sides of body 04/23/2018   OSA (obstructive sleep apnea) 04/23/2018   Urinary urgency 04/23/2018   Seborrheic keratosis 04/23/2018   Microscopic hematuria 11/20/2017   Acquired phimosis of penis 11/20/2017   Benign essential hypertension 11/20/2017   Benign prostatic hyperplasia with urinary obstruction 11/20/2017   Chronic hoarseness 11/20/2017   Contusion of chest 11/20/2017   Disorder of bone and articular cartilage 11/20/2017   Disorder of rotator cuff 11/20/2017   Dysphagia 11/20/2017   Gastroesophageal reflux disease 11/20/2017   Generalized osteoarthritis 11/20/2017   History of stroke without residual deficits 11/20/2017   Hypoxemia 11/20/2017   Impaired fasting glucose  11/20/2017   Increased frequency of urination 11/20/2017   Insomnia 11/20/2017   Backache 11/20/2017   Mixed hyperlipidemia 11/20/2017   Obstructive sleep apnea syndrome 11/20/2017   Osteoporosis 11/20/2017   Urinary urgency 11/20/2017   Shoulder joint painful on movement 11/20/2017   Seborrheic keratosis 11/20/2017   Rib pain 11/20/2017   Pharyngitis 11/20/2017   Male hypogonadism 11/20/2017    ONSET DATE: > 1year: date of referral 02/05/2024   REFERRING DIAG: R41.3 (ICD-10-CM) - Other amnesia  THERAPY DIAG:  Cognitive communication deficit  Rationale for Evaluation and Treatment Rehabilitation  SUBJECTIVE:   PERTINENT HISTORY and DIAGNOSTIC FINDINGS: Pt is a right handed 83 year old male with medical history of hearing loss, insomnia, mild cognitive impairment, hx of left CVA. Per chart review neurologist notes "Stable memory loss with a history of cerebral atrophy and white matter microvascular ischemic changes on MRI. SLUMS score of 28/30 on 10/01/2023"  MRI Brain without contrast 10/12/2023 IMPRESSION:  1. No evidence of acute intracranial abnormality.  2. Chronic hemorrhage or chronic hemorrhagic infarct within the left  subinsular white matter/basal ganglia.  3. Minimal chronic small-vessel ischemic changes elsewhere within  the cerebral white matter.  4. Mild generalized cerebral atrophy.     PAIN:  Are you having pain? No   FALLS: Has patient fallen in last 6 months?  No  LIVING ENVIRONMENT: Lives with: lives with their spouse Lives in: House/apartment  PLOF:  Level of assistance: Independent with ADLs, Independent with IADLs Employment: Retired   PATIENT GOALS   to improve memory  SUBJECTIVE STATEMENT: Pt reports upcoming vacation to [place]  Pt accompanied by: significant other   OBJECTIVE:   TODAY'S TREATMENT:  Skilled treatment session focused on pt's cognitive communication goals. SLP facilitated session by providing the following  interventions:  Pt reports that the audiologist adjusted the app for his hearing aids to help with background noise at church - pt reports that he couldn't tell any difference. SLP has not received audiogram, will call.   Introduced (skilled verbal and written information and instructions provided for ) daily schedule/planner for improved recall of upcoming events and recall of past events  Pt also states that he intermittently struggles with recalling family members' names - recommend pt an dhis wife look through family pictures on a daily basis to increase word finding - education provided on increasing interaction with information also increases memory    PATIENT REPORTED OUTCOME MEASURES (PROM): To be completed over the next 3 sessions    PATIENT EDUCATION: Education details: see above Person educated: Patient and Spouse Education method: Explanation Education comprehension: verbalized understanding   HOME EXERCISE PROGRAM:   Daily planner  Looking at and naming family members' names  GOALS:  Goals reviewed with patient? Yes  SHORT TERM GOALS: Target date: 10 sessions  With Mod I, patient will demonstrate understanding of functional cognitive activities by listing 2 activities for home maintenance program. Baseline: Goal status: INITIAL  2.   With Mod I, patient will recall information after given delay of increasing length (5 to 60 seconds) with 80% accuracy. Baseline:  Goal status: INITIAL   LONG TERM GOALS: Target date: 04/13/2024  With Mod I, patient will use strategies to improve memory for important information with 90% acc. Independently (ie., white board, daily planner/calendar, Apps on phone).   Baseline:  Goal status: INITIAL    ASSESSMENT:  CLINICAL IMPRESSION: Patient is a 83 y.o. male who was seen today for a cognitive communication evaluation d/t report of memory loss. Marland KitchenPt presents with objective score of 90 out of 100 on the ACE but reports  functional deficits "my thinking is slower than most people's speech I have trouble remembering, I wan to say something and the word won't be there." Recommend continued assessment and implementation of compensatory memory strategies to improve functional independence.   Pt and his wife continue to be eager to implement recommendation and strategies. For a list of recommendations/strategies/external aids see the above treatment note for details.    OBJECTIVE IMPAIRMENTS include memory. These impairments are limiting patient from managing medications, managing appointments, managing finances, household responsibilities, and effectively communicating at home and in community. Factors affecting potential to achieve goals and functional outcome are previous level of function. Patient will benefit from skilled SLP services to address above impairments and improve overall function.  REHAB POTENTIAL: Good  PLAN: SLP FREQUENCY: 1-2x/week  SLP DURATION: 8 weeks  PLANNED INTERVENTIONS: Internal/external aids, SLP instruction and feedback, Compensatory strategies, and Patient/family education   Deneka Greenwalt B. Dreama Saa, M.S., CCC-SLP, Tree surgeon Certified Brain Injury Specialist [facility]  [facility] Rehabilitation Services Office [phone] Ascom [phone] Fax [phone]

## 2024-02-26 ENCOUNTER — Ambulatory Visit: Payer: Medicare Other | Admitting: Speech Pathology

## 2024-02-26 DIAGNOSIS — R41841 Cognitive communication deficit: Secondary | ICD-10-CM

## 2024-02-26 NOTE — Therapy (Unsigned)
 OUTPATIENT SPEECH LANGUAGE PATHOLOGY  COGNITION TREATMENT NOTE DISCHARGE SUMMARY   Patient Name: Shawn Khan MRN: [id number] DOB:Sep 23, 1941, 83 y.o., male Today's Date: 02/26/2024  PCP: Aram Beecham, MD REFERRING PROVIDER: Cristopher Peru, MD   End of Session - 02/26/24 1001     Visit Number 4    Number of Visits 17    Date for SLP Re-Evaluation 04/13/24    Authorization Type [employer] [facility] Medicare    Progress Note Due on Visit 10    SLP Start Time 0800    SLP Stop Time  0900    SLP Time Calculation (min) 60 min    Activity Tolerance Patient tolerated treatment well             Past Medical History:  Diagnosis Date   Arthritis    Chronic hoarseness    Complication of anesthesia    h/o nasal intubation x 1 hoarseness x 1 week   Contusion of chest    Cough    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   Gastritis    GERD (gastroesophageal reflux disease)    Hemorrhagic stroke (HCC)    more than 5 years ago   History of chicken pox    Hyperlipidemia    did not like crestor    Hypertension    Hypoglycemia    Hypogonadism in male    Insomnia    Low testosterone in male    Osteoporosis    Prediabetes    A1C 5.9 06/25/18    Rotator cuff disorder    Sleep apnea    cpap   Stroke [facility])    Vocal cord dysfunction    in 2018 damage per pt    Vocal cord nodule    Zenker diverticulum    1.7 cm    Past Surgical History:  Procedure Laterality Date   APPENDECTOMY     1955   BALLOON DILATION  12/09/2023   Procedure: BALLOON DILATION;  Surgeon: [name], DO;  Location: [facility] ENDOSCOPY;  Service: Gastroenterology;;   BIOPSY  12/09/2023   Procedure: BIOPSY;  Surgeon: [name], DO;  Location: [facility] ENDOSCOPY;  Service: Gastroenterology;;   ESOPHAGEAL DILATION  01/02/2024   Procedure: ESOPHAGEAL DILATION;  Surgeon: [name], DO;  Location: [facility] ENDOSCOPY;  Service: Gastroenterology;;   ESOPHAGOGASTRODUODENOSCOPY N/A 01/02/2024    Procedure: ESOPHAGOGASTRODUODENOSCOPY (EGD);  Surgeon: [name], DO;  Location: [facility] ENDOSCOPY;  Service: Gastroenterology;  Laterality: N/A;   ESOPHAGOGASTRODUODENOSCOPY (EGD) WITH PROPOFOL N/A 12/09/2023   Procedure: ESOPHAGOGASTRODUODENOSCOPY (EGD) WITH PROPOFOL;  Surgeon: [name], DO;  [facility] ENDOSCOPY;  Service: Gastroenterology;  Laterality: N/A;   EYE SURGERY     b/l cataract   FEMUR FRACTURE SURGERY     FRACTURE SURGERY     IR GUIDED DRAIN W CATHETER PLACEMENT  06/13/2021   IR PERC CHOLECYSTOSTOMY  06/13/2021   LARYNGOSCOPY  09/17/2017   Procedure: LARYNGOSCOPY;  Surgeon: Linus Salmons, MD;  Location: [facility] ORS;  Service: ENT;;   PENILE BIOPSY     04/2018 ulcer with lichenoid inflammation    rcr Bilateral    SHOULDER SURGERY     x3 right and left    THROAT SURGERY     vocal cord nodule   TONSILLECTOMY     1948   Patient Active Problem List   Diagnosis Date Noted   Acute cholecystitis 06/12/2021   Aortic atherosclerosis (HCC) 03/02/2019   Bradycardia 12/18/2018   Leg cramps 12/18/2018   Prediabetes 10/03/2018  Barrett's esophagus 10/03/2018   Penile ulcer 09/16/2018   Zenker's diverticulum 09/16/2018   Degenerative joint disease involving multiple joints on both sides of body 04/23/2018   OSA (obstructive sleep apnea) 04/23/2018   Urinary urgency 04/23/2018   Seborrheic keratosis 04/23/2018   Microscopic hematuria 11/20/2017   Acquired phimosis of penis 11/20/2017   Benign essential hypertension 11/20/2017   Benign prostatic hyperplasia with urinary obstruction 11/20/2017   Chronic hoarseness 11/20/2017   Contusion of chest 11/20/2017   Disorder of bone and articular cartilage 11/20/2017   Disorder of rotator cuff 11/20/2017   Dysphagia 11/20/2017   Gastroesophageal reflux disease 11/20/2017   Generalized osteoarthritis 11/20/2017   History of stroke without residual deficits 11/20/2017   Hypoxemia 11/20/2017   Impaired  fasting glucose 11/20/2017   Increased frequency of urination 11/20/2017   Insomnia 11/20/2017   Backache 11/20/2017   Mixed hyperlipidemia 11/20/2017   Obstructive sleep apnea syndrome 11/20/2017   Osteoporosis 11/20/2017   Urinary urgency 11/20/2017   Shoulder joint painful on movement 11/20/2017   Seborrheic keratosis 11/20/2017   Rib pain 11/20/2017   Pharyngitis 11/20/2017   Male hypogonadism 11/20/2017    ONSET DATE: > 1year: date of referral 02/05/2024   REFERRING DIAG: R41.3 (ICD-10-CM) - Other amnesia  THERAPY DIAG:  Cognitive communication deficit  Rationale for Evaluation and Treatment Rehabilitation  SUBJECTIVE:   PERTINENT HISTORY and DIAGNOSTIC FINDINGS: Pt is a right handed 83 year old male with medical history of hearing loss, insomnia, mild cognitive impairment, hx of left CVA. Per chart review neurologist notes "Stable memory loss with a history of cerebral atrophy and white matter microvascular ischemic changes on MRI. SLUMS score of 28/30 on 10/01/2023"  MRI Brain without contrast 10/12/2023 IMPRESSION:  1. No evidence of acute intracranial abnormality.  2. Chronic hemorrhage or chronic hemorrhagic infarct within the left  subinsular white matter/basal ganglia.  3. Minimal chronic small-vessel ischemic changes elsewhere within  the cerebral white matter.  4. Mild generalized cerebral atrophy.     PAIN:  Are you having pain? No   FALLS: Has patient fallen in last 6 months?  No  LIVING ENVIRONMENT: Lives with: lives with their spouse Lives in: House/apartment  PLOF:  Level of assistance: Independent with ADLs, Independent with IADLs Employment: Retired   PATIENT GOALS   to improve memory  SUBJECTIVE STATEMENT: Pt reports upcoming vacation to [place]  Pt accompanied by: significant other   OBJECTIVE:   TODAY'S TREATMENT:  Skilled treatment session focused on pt's cognitive communication goals. SLP facilitated session by providing the  following interventions:  Pt brought in his notebook with information written in it - reports helpfulness in identifying and accomplishing activities everyday  Pt continues to engage in complex activities of daily living  This writer received pt's audiogram and reviewed information as well as strategies for improved listening/attending within group settings.   At this time, all education has been completed, pt is eager and currently using compensatory memory strategies     PATIENT EDUCATION: Education details: see above Person educated: Patient and Spouse Education method: Explanation Education comprehension: verbalized understanding   HOME EXERCISE PROGRAM:   Daily planner  Looking at and naming family members' names   GOALS:  Goals reviewed with patient? Yes  SHORT TERM GOALS: Target date: 10 sessions  Updated: 02/26/2024 With Mod I, patient will demonstrate understanding of functional cognitive activities by listing 2 activities for home maintenance program. Baseline: Goal status: INITIAL: MET  2.   With Mod I, patient  will recall information after given delay of increasing length (5 to 60 seconds) with 80% accuracy. Baseline:  Goal status: INITIAL: MET   LONG TERM GOALS: Target date: 04/13/2024  Updated: 02/26/2024 With Mod I, patient will use strategies to improve memory for important information with 90% acc. Independently (ie., white board, daily planner/calendar, Apps on phone).   Baseline:  Goal status: INITIAL:MET    ASSESSMENT:  CLINICAL IMPRESSION: Patient is a 83 y.o. male who was seen today for a cognitive communication treatment d/t report of memory loss. Marland KitchenPt presents with objective score of 90 out of 100 on the ACE but reports functional deficits "my thinking is slower than most people's speech I have trouble remembering, I wan to say something and the word won't be there." Recommend continued assessment and implementation of compensatory memory  strategies to improve functional independence.   Pt and his wife continue to be eager to implement recommendation and strategies. See the above treatment note for more details. At this time, pt has met all of his goals and is appropriate for discharge. He and his wife are in agreement.    PLAN: Pt is appropriate for discharge from services.    Joene Gelder B. Dreama Saa, M.S., CCC-SLP, Tree surgeon Certified Brain Injury Specialist [facility]  [facility] Rehabilitation Services Office [phone] Ascom [phone] Fax [phone]

## 2024-03-02 ENCOUNTER — Ambulatory Visit: Payer: Medicare Other | Admitting: Speech Pathology

## 2024-03-04 ENCOUNTER — Ambulatory Visit: Payer: Medicare Other | Admitting: Speech Pathology

## 2024-03-09 ENCOUNTER — Ambulatory Visit: Payer: Medicare Other | Admitting: Speech Pathology

## 2024-03-11 ENCOUNTER — Ambulatory Visit: Payer: Medicare Other | Admitting: Speech Pathology

## 2024-03-16 ENCOUNTER — Ambulatory Visit: Payer: Medicare Other | Admitting: Speech Pathology

## 2024-03-18 ENCOUNTER — Ambulatory Visit: Payer: Medicare Other | Admitting: Speech Pathology

## 2024-03-23 ENCOUNTER — Ambulatory Visit: Payer: Medicare Other | Admitting: Speech Pathology

## 2024-03-25 ENCOUNTER — Ambulatory Visit: Payer: Medicare Other | Admitting: Speech Pathology

## 2024-03-30 ENCOUNTER — Ambulatory Visit: Payer: Medicare Other | Admitting: Speech Pathology

## 2024-04-01 ENCOUNTER — Ambulatory Visit: Payer: Medicare Other | Admitting: Speech Pathology

## 2024-05-12 ENCOUNTER — Ambulatory Visit
Admission: EM | Admit: 2024-05-12 | Discharge: 2024-05-12 | Disposition: A | Discharge status: Home / Self Care | Attending: Family Medicine | Admitting: Family Medicine

## 2024-05-12 DIAGNOSIS — W57XXXA Bitten or stung by nonvenomous insect and other nonvenomous arthropods, initial encounter: Secondary | ICD-10-CM | POA: Diagnosis not present

## 2024-05-12 DIAGNOSIS — L03115 Cellulitis of right lower limb: Secondary | ICD-10-CM | POA: Diagnosis not present

## 2024-05-12 DIAGNOSIS — S80861A Insect bite (nonvenomous), right lower leg, initial encounter: Secondary | ICD-10-CM | POA: Diagnosis not present

## 2024-05-12 MED ORDER — DOXYCYCLINE HYCLATE 100 MG PO CAPS: 100.0000 mg | ORAL_CAPSULE | Freq: Two times a day (BID) | ORAL | 0 refills | Status: AC

## 2024-05-12 NOTE — ED Provider Notes (Signed)
 [facility]    CSN: [id number] Arrival date & time: 05/12/24  1215      History   Chief Complaint Chief Complaint  Patient presents with   Rash   Insect Bite    HPI Shawn Khan is a 83 y.o. male presents for insect bite.  Patient reports this 2 days ago he was in the yard weeding when the next morning he noticed a red raised insect bite to his right lateral lower leg.  States since then he has had redness and warmth extending from the area.  Denies drainage swelling, fevers, headache, body aches.  He does not know what bit him.  Denies history of MRSA.  Does report he has neuropathy so is unable to feel if it is painful or itchy.  He has been taking allergy medicine and used a topical triamcinolone with no improvement.  No other concerns at this time.   Rash   Past Medical History:  Diagnosis Date   Arthritis    Chronic hoarseness    Complication of anesthesia    h/o nasal intubation x 1 hoarseness x 1 week   Contusion of chest    Cough    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   Gastritis    GERD (gastroesophageal reflux disease)    Hemorrhagic stroke (HCC)    more than 5 years ago   History of chicken pox    Hyperlipidemia    did not like crestor    Hypertension    Hypoglycemia    Hypogonadism in male    Insomnia    Low testosterone  in male    Osteoporosis    Prediabetes    A1C 5.9 06/25/18    Rotator cuff disorder    Sleep apnea    cpap   Stroke (HCC)    Vocal cord dysfunction    in 2018 damage per pt    Vocal cord nodule    Zenker diverticulum    1.7 cm     Patient Active Problem List   Diagnosis Date Noted   Acute cholecystitis 06/12/2021   Aortic atherosclerosis (HCC) 03/02/2019   Bradycardia 12/18/2018   Leg cramps 12/18/2018   Prediabetes 10/03/2018   Barrett's esophagus 10/03/2018   Penile ulcer 09/16/2018   Zenker's diverticulum 09/16/2018   Degenerative joint disease involving multiple joints on both sides of body 04/23/2018   OSA  (obstructive sleep apnea) 04/23/2018   Urinary urgency 04/23/2018   Seborrheic keratosis 04/23/2018   Microscopic hematuria 11/20/2017   Acquired phimosis of penis 11/20/2017   Benign essential hypertension 11/20/2017   Benign prostatic hyperplasia with urinary obstruction 11/20/2017   Chronic hoarseness 11/20/2017   Contusion of chest 11/20/2017   Disorder of bone and articular cartilage 11/20/2017   Disorder of rotator cuff 11/20/2017   Dysphagia 11/20/2017   Gastroesophageal reflux disease 11/20/2017   Generalized osteoarthritis 11/20/2017   History of stroke without residual deficits 11/20/2017   Hypoxemia 11/20/2017   Impaired fasting glucose 11/20/2017   Increased frequency of urination 11/20/2017   Insomnia 11/20/2017   Backache 11/20/2017   Mixed hyperlipidemia 11/20/2017   Obstructive sleep apnea syndrome 11/20/2017   Osteoporosis 11/20/2017   Urinary urgency 11/20/2017   Shoulder joint painful on movement 11/20/2017   Seborrheic keratosis 11/20/2017   Rib pain 11/20/2017   Pharyngitis 11/20/2017   Male hypogonadism 11/20/2017    Past Surgical History:  Procedure Laterality Date   APPENDECTOMY     1955   BALLOON DILATION  12/09/2023  Procedure: BALLOON DILATION;  Surgeon: Quintin Buckle, DO;  Location: [facility] ENDOSCOPY;  Service: Gastroenterology;;   BIOPSY  12/09/2023   Procedure: BIOPSY;  Surgeon: Quintin Buckle, DO;  Location: [facility] ENDOSCOPY;  Service: Gastroenterology;;   ESOPHAGEAL DILATION  01/02/2024   Procedure: ESOPHAGEAL DILATION;  Surgeon: Quintin Buckle, DO;  Location: [facility] ENDOSCOPY;  Service: Gastroenterology;;   ESOPHAGOGASTRODUODENOSCOPY N/A 01/02/2024   Procedure: ESOPHAGOGASTRODUODENOSCOPY (EGD);  Surgeon: Quintin Buckle, DO;  Location[facility] ENDOSCOPY;  Service: Gastroenterology;  Laterality: N/A;   ESOPHAGOGASTRODUODENOSCOPY (EGD) WITH PROPOFOL  N/A 12/09/2023   Procedure: ESOPHAGOGASTRODUODENOSCOPY (EGD) WITH PROPOFOL ;   Surgeon: Quintin Buckle, DO;  Location: [facility] ENDOSCOPY;  Service: Gastroenterology;  Laterality: N/A;   EYE SURGERY     b/l cataract   FEMUR FRACTURE SURGERY     FRACTURE SURGERY     IR GUIDED DRAIN W CATHETER PLACEMENT  06/13/2021   IR PERC CHOLECYSTOSTOMY  06/13/2021   LARYNGOSCOPY  09/17/2017   Procedure: LARYNGOSCOPY;  Surgeon: Lesly Raspberry, MD;  Location: [facility] ORS;  Service: ENT;;   PENILE BIOPSY     04/2018 ulcer with lichenoid inflammation    rcr Bilateral    SHOULDER SURGERY     x3 right and left    THROAT SURGERY     vocal cord nodule   TONSILLECTOMY     1948       Home Medications    Prior to Admission medications   Medication Sig Start Date End Date Taking? Authorizing Provider  ascorbic acid  (VITAMIN C) 1000 MG tablet Take by mouth.   Yes [provider]  cholecalciferol  (VITAMIN D3) 25 MCG (1000 UNIT) tablet Take 1,000 Units by mouth daily.   Yes [provider]  doxycycline  (VIBRAMYCIN ) 100 MG capsule Take 1 capsule (100 mg total) by mouth 2 (two) times daily. 05/12/24  Yes Aamiyah Derrick, Jodi R, NP  tamsulosin  (FLOMAX ) 0.4 MG CAPS capsule Take 1 capsule (0.4 mg total) by mouth 2 (two) times daily. appt further refills 04/01/20  Yes McLean-Scocuzza, Karon Packer, MD  acetaminophen  (TYLENOL ) 500 MG tablet Take by mouth. 07/06/21   [provider]  albuterol  (VENTOLIN  HFA) 108 (90 Base) MCG/ACT inhaler Inhale 1-2 puffs into the lungs every 6 (six) hours as needed for wheezing or shortness of breath. 03/15/22   Harden Leyden, NP  alendronate (FOSAMAX) 70 MG tablet Take 70 mg by mouth once a week. Take with a full glass of water on an empty stomach.    [provider]  amoxicillin -clavulanate (AUGMENTIN ) 875-125 MG tablet Take 1 tablet by mouth every 12 (twelve) hours. 12/23/23   Brimage, Vondra, DO  Cholecalciferol  25 MCG (1000 UT) capsule Take by mouth.    [provider]  clotrimazole -betamethasone  (LOTRISONE) cream Apply 1  application topically 2 (two) times daily. (Apply to the genital area as directed)    [provider]  finasteride (PROSCAR) 5 MG tablet Take 5 mg by mouth.    [provider]  gabapentin (NEURONTIN) 300 MG capsule Take by mouth. 05/29/22   [provider]  guaiFENesin-codeine (CHERATUSSIN AC) 100-10 MG/5ML syrup Take 10 mLs by mouth at bedtime as needed for cough. 11/25/22   Nancy Axon B, PA-C  levocetirizine (XYZAL ) 5 MG tablet Take 1 tablet (5 mg total) by mouth every evening. 03/15/22   Harden Leyden, NP  moxifloxacin  (VIGAMOX ) 0.5 % ophthalmic solution Use 2 drops every 2 hours for 2 days.  Then use 2 drops every 6 hours for 5 days.  04/06/22   Woods, Jaclyn M, PA-C  Multiple Vitamin (MULTI-VITAMIN) tablet Take by mouth.    [provider]  oxyCODONE  (OXY IR/ROXICODONE ) 5 MG immediate release tablet Take 1 tablet (5 mg total) by mouth every 6 (six) hours as needed for severe pain or breakthrough pain. 06/15/21   Schulz, Zachary R, PA-C  predniSONE  (STERAPRED UNI-PAK 21 TAB) 10 MG (21) TBPK tablet Take by mouth daily. Take 6 tabs by mouth daily for 1, then 5 tabs for 1 day, then 4 tabs for 1 day, then 3 tabs for 1 day, then 2 tabs for 1 day, then 1 tab for 1 day. 12/23/23   Brimage, Vondra, DO  Sodium Chloride  Flush (NORMAL SALINE FLUSH) 0.9 % SOLN 5 mLs by Intracatheter route 2 (two) times daily 08/08/21   [provider]  venlafaxine  XR (EFFEXOR -XR) 37.5 MG 24 hr capsule Take 37.5 mg by mouth daily.    [provider]  vitamin B-12 (CYANOCOBALAMIN ) 1000 MCG tablet Take 1,000 mcg by mouth daily.    [provider]  zinc  sulfate 220 (50 Zn) MG capsule Take 220 mg by mouth daily.    [provider]    Family History Family History  Problem Relation Age of Onset   Hearing loss Father    Other Father        brain tumor   Parkinson's disease Father    Arthritis Father    Cancer Mother        pancreatic    Diabetes  Sister    Arthritis Son    Stroke Maternal Grandfather    Stroke Paternal Grandmother    Stroke Paternal Grandfather    Diabetes Sister    Hyperlipidemia Sister    Hypertension Sister    Asthma Daughter    Hypertension Daughter    Asthma Son    Prostate cancer Neg Hx    Bladder Cancer Neg Hx    Kidney cancer Neg Hx     Social History Social History   Tobacco Use   Smoking status: Never   Smokeless tobacco: Never  Vaping Use   Vaping status: Never Used  Substance Use Topics   Alcohol use: No   Drug use: No     Allergies   Patient has no known allergies.   Review of Systems Review of Systems  Skin:  Positive for rash.     Physical Exam Triage Vital Signs ED Triage Vitals  Encounter Vitals Group     BP 05/12/24 1230 100/74     Systolic BP Percentile --      Diastolic BP Percentile --      Pulse Rate 05/12/24 1230 (!) 59     Resp 05/12/24 1230 15     Temp 05/12/24 1230 98.3 F (36.8 C)     Temp Source 05/12/24 1230 Oral     SpO2 05/12/24 1230 99 %     Weight --      Height --      Head Circumference --      Peak Flow --      Pain Score 05/12/24 1228 5     Pain Loc --      Pain Education --      Exclude from Growth Chart --    No data found.  Updated Vital Signs BP 100/74 (BP Location: Left Arm)   Pulse (!) 59   Temp 98.3 F (36.8 C) (Oral)   Resp 15   SpO2 99%   Visual  Acuity Right Eye Distance:   Left Eye Distance:   Bilateral Distance:    Right Eye Near:   Left Eye Near:    Bilateral Near:     Physical Exam Vitals and nursing note reviewed.  Constitutional:      General: He is not in acute distress.    Appearance: Normal appearance. He is not ill-appearing.  HENT:     Head: Normocephalic and atraumatic.  Eyes:     Pupils: Pupils are equal, round, and reactive to light.  Cardiovascular:     Comments: HR 59 Pulmonary:     Effort: Pulmonary effort is normal.  Skin:    General: Skin is warm and dry.          Comments: There  is a insect bite to the right lower lateral leg with erythema and slight warmth extending from the area. See photo  Neurological:     General: No focal deficit present.     Mental Status: He is alert and oriented to person, place, and time.  Psychiatric:        Mood and Affect: Mood normal.        Behavior: Behavior normal.      UC Treatments / Results  Labs (all labs ordered are listed, but only abnormal results are displayed) Labs Reviewed - No data to display  EKG   Radiology No results found.  Procedures Procedures (including critical care time)  Medications Ordered in UC Medications - No data to display  Initial Impression / Assessment and Plan / UC Course  I have reviewed the triage vital signs and the nursing notes.  Pertinent labs & imaging results that were available during my care of the patient were reviewed by me and considered in my medical decision making (see chart for details).     Reviewed exam and symptoms with patient.  No red flags.  He is afebrile.  Will treat for infected insect bite/cellulitis with doxycycline, side effect profile reviewed.  Area is with a skin marker and he was advised to seek reevaluation if the redness extends around the marked area and/or he develops fevers, red flags reviewed and he verbalized understanding.  Advised PCP follow-up 2 to 3 days for recheck.  ER precautions reviewed. Final Clinical Impressions(s) / UC Diagnoses   Final diagnoses:  Cellulitis of right lower extremity  Insect bite of right lower leg, initial encounter     Discharge Instructions      Start doxycycline twice daily for 10 days. The area has been marked with a skin marker. If the redness extends beyond the marked area and/or you develop a fever please seek re-evaluation in the ER.  Please follow-up with your PCP in 3 days for recheck.  Hope you feel better soon!  ED Prescriptions     Medication Sig Dispense Auth. Provider   doxycycline  (VIBRAMYCIN) 100 MG capsule Take 1 capsule (100 mg total) by mouth 2 (two) times daily. 20 capsule Corynn Solberg, Jodi R, NP      PDMP not reviewed this encounter.   Alleen Arbour, NP 05/12/24 1250

## 2024-05-12 NOTE — Discharge Instructions (Addendum)
 Start doxycycline  twice daily for 10 days. The area has been marked with a skin marker. If the redness extends beyond the marked area and/or you develop a fever please seek re-evaluation in the ER.  Please follow-up with your PCP in 3 days for recheck.  Hope you feel better soon!

## 2024-05-12 NOTE — ED Triage Notes (Signed)
 Patient states that he weeded sat. Noticed Sunday that the are was red and bruised. Area is warm to the touch and stating to get painful.

## 2024-09-23 DIAGNOSIS — Z141 Cystic fibrosis carrier: Secondary | ICD-10-CM | POA: Insufficient documentation

## 2025-01-19 ENCOUNTER — Ambulatory Visit
Admission: EM | Admit: 2025-01-19 | Discharge: 2025-01-19 | Disposition: A | Discharge status: Home / Self Care | Source: Home / Self Care | Attending: Family Medicine | Admitting: Family Medicine

## 2025-01-19 DIAGNOSIS — R21 Rash and other nonspecific skin eruption: Secondary | ICD-10-CM

## 2025-01-19 MED ORDER — TRIAMCINOLONE ACETONIDE 0.1 % EX OINT: 1.0000 | TOPICAL_OINTMENT | Freq: Two times a day (BID) | CUTANEOUS | 0 refills | Status: AC

## 2025-01-19 NOTE — Discharge Instructions (Signed)
 Apply the steroid ointment twice a day. If the rash spreads, return to the urgent care or see your primary care provider as you may need antibiotics or treatment for Shingles.
